# Patient Record
Sex: Male | Born: 1975 | Race: White | Hispanic: No | Smoking: Current every day smoker
Health system: Southern US, Community
[De-identification: ages and names within clinical notes are randomized; demographics above are authoritative.]

## PROBLEM LIST (undated history)

## (undated) DIAGNOSIS — J329 Chronic sinusitis, unspecified: Secondary | ICD-10-CM

## (undated) DIAGNOSIS — M549 Dorsalgia, unspecified: Secondary | ICD-10-CM

## (undated) DIAGNOSIS — G8929 Other chronic pain: Secondary | ICD-10-CM

---

## 2012-09-03 ENCOUNTER — Encounter (HOSPITAL_COMMUNITY): Payer: Self-pay | Admitting: Emergency Medicine

## 2012-09-03 ENCOUNTER — Emergency Department (INDEPENDENT_AMBULATORY_CARE_PROVIDER_SITE_OTHER)
Admission: EM | Admit: 2012-09-03 | Discharge: 2012-09-03 | Disposition: A | Discharge status: Home / Self Care | Payer: Medicaid Other | Source: Home / Self Care

## 2012-09-03 DIAGNOSIS — M549 Dorsalgia, unspecified: Secondary | ICD-10-CM

## 2012-09-03 DIAGNOSIS — G8929 Other chronic pain: Secondary | ICD-10-CM

## 2012-09-03 DIAGNOSIS — J309 Allergic rhinitis, unspecified: Secondary | ICD-10-CM

## 2012-09-03 HISTORY — DX: Dorsalgia, unspecified: M54.9

## 2012-09-03 MED ORDER — TRAMADOL HCL 50 MG PO TABS: 50.0000 mg | ORAL_TABLET | Freq: Four times a day (QID) | ORAL | Status: DC | PRN

## 2012-09-03 MED ORDER — METHYLPREDNISOLONE 4 MG PO KIT: PACK | ORAL | Status: DC

## 2012-09-03 MED ORDER — FLUTICASONE PROPIONATE 50 MCG/ACT NA SUSP: 2.0000 | Freq: Every day | NASAL | Status: DC

## 2012-09-03 NOTE — ED Notes (Signed)
Multiple complaints:  Low, middle back pain. Low back pain is chronic problem And reports sinus congestion.  Sinus issues started 2 days ago

## 2012-09-03 NOTE — ED Provider Notes (Signed)
Medical screening examination/treatment/procedure(s) were performed by non-physician practitioner and as supervising physician I was immediately available for consultation/collaboration.  Leslee Home, M.D.  Reuben Likes, MD 09/03/12 2107

## 2012-09-03 NOTE — ED Provider Notes (Signed)
History     CSN: [id number]  Arrival date & time 09/03/12  1305   First MD Initiated Contact with Patient 09/03/12 1453      Chief Complaint  Patient presents with  . Back Pain    (Consider location/radiation/quality/duration/timing/severity/associated sxs/prior treatment) HPI Comments: Patient complains of sinus infection for 2 days. 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 It is associated with PND minor intermittent sore throat. Denies fever, shortness of breath or chest pain. He has tried no medications for this. His second complaint is that of chronic back pain for which she states is due to a bulging disc. He was diagnosed with a several years ago and currently has a physician in [place]. He states when he came[place] he forgot his pain medications. He has been tried on tramadol, Toradol and other nonnarcotic agents which have not helped him. He is asking for something stronger. He states the back pain is located along the upper para lumbar musculature. Pain sometimes radiates down the back of his hip and thigh.   Past Medical History  Diagnosis Date  . Back pain     History reviewed. No pertinent past surgical history.  No family history on file.  History  Substance Use Topics  . Smoking status: Current Every Day Smoker  . Smokeless tobacco: Not on file  . Alcohol Use: No      Review of Systems  Constitutional: Negative.  Negative for diaphoresis and fatigue.  HENT: Positive for congestion, sore throat, rhinorrhea, trouble swallowing, postnasal drip and sinus pressure. Negative for hearing loss, ear pain, facial swelling, neck pain and neck stiffness.   Eyes: Negative for pain, discharge and redness.  Respiratory: Positive for cough. Negative for chest tightness and shortness of breath.   Cardiovascular: Negative.   Gastrointestinal: Negative.   Genitourinary: Negative.    Musculoskeletal: Positive for myalgias and back pain.  Skin: Negative.   Neurological: Negative.     Allergies  Review of patient's allergies indicates no known allergies.  Home Medications   Current Outpatient Rx  Name  Route  Sig  Dispense  Refill  . ValACYclovir HCl (VALTREX PO)   Oral   Take by mouth.         . fluticasone (FLONASE) 50 MCG/ACT nasal spray   Nasal   Place 2 sprays into the nose daily.   16 g   0   . Gabapentin (NEURONTIN PO)   Oral   Take by mouth.         . methylPREDNISolone (MEDROL DOSEPAK) 4 MG tablet      follow package directions   21 tablet   0   . Oxycodone-Acetaminophen (PERCOCET PO)   Oral   Take by mouth.         . traMADol (ULTRAM) 50 MG tablet   Oral   Take 1 tablet (50 mg total) by mouth every 6 (six) hours as needed for pain.   20 tablet   0     BP 132/70  Pulse 78  Temp(Src) 98.9 F (37.2 C) (Oral)  Resp 18  SpO2 99%  Physical Exam  Nursing note and vitals reviewed. Constitutional: He is oriented to person, place, and time. He appears well-developed and well-nourished. No distress.  HENT:  Right Ear: External ear normal.  Left Ear: External ear normal.  Minor minor oropharyngeal erythema with cobblestoning. No exudates or swelling. Bilateral TMs are normal  Neck: Normal range of motion. Neck supple.  Cardiovascular: Normal rate, regular rhythm and normal heart sounds.   Pulmonary/Chest: Effort normal and breath sounds normal. No respiratory distress.  Faint bilateral expiratory wheeze. He is a smoker.  Musculoskeletal: Normal range of motion. He exhibits tenderness. He exhibits no edema.  Tenderness along the lower parathoracic and upper paralumbar musculature. No step off deformity of the lumbar spine. No mobilization of the vertebrae. Moves all extremities. Strength 5 over 5.  Lymphadenopathy:    He has no cervical adenopathy.  Neurological: He is alert and oriented to person, place, and time.  Skin:  Skin is warm and dry. No rash noted.  Psychiatric: He has a normal mood and affect.    ED Course  Procedures (including critical care time)  Labs Reviewed - No data to display No results found.   1. Allergic sinusitis   2. Allergic rhinitis due to allergen   3. Chronic back pain greater than 3 months duration       MDM  Medrol Dosepak as directed, this is directed toward the treatment of his radiating back pain and sinus congestion, allergies and inflammation. Fluticasone nasal spray as directed tramadol 50 mg every 4-6 hours when necessary pain May augment Tylenol every 4 hours as needed. Copious amount of nasal saline spray throughout the day. Sudafed PE 10 mg every 4 hours when necessary congestion Claritin or Allegra OTC med daily as an antihistamine for allergies and drainage. Drink plenty of fluids stay well hydrated   Hayden Rasmussen, NP 09/03/12 1655

## 2012-09-17 ENCOUNTER — Encounter (HOSPITAL_COMMUNITY): Payer: Self-pay | Admitting: *Deleted

## 2012-09-17 ENCOUNTER — Emergency Department (HOSPITAL_COMMUNITY)
Admission: EM | Admit: 2012-09-17 | Discharge: 2012-09-17 | Disposition: A | Discharge status: Home / Self Care | Payer: Medicaid Other | Attending: Emergency Medicine | Admitting: Emergency Medicine

## 2012-09-17 DIAGNOSIS — K648 Other hemorrhoids: Secondary | ICD-10-CM | POA: Insufficient documentation

## 2012-09-17 DIAGNOSIS — M5416 Radiculopathy, lumbar region: Secondary | ICD-10-CM

## 2012-09-17 DIAGNOSIS — K921 Melena: Secondary | ICD-10-CM | POA: Insufficient documentation

## 2012-09-17 DIAGNOSIS — IMO0002 Reserved for concepts with insufficient information to code with codable children: Secondary | ICD-10-CM | POA: Insufficient documentation

## 2012-09-17 DIAGNOSIS — F17200 Nicotine dependence, unspecified, uncomplicated: Secondary | ICD-10-CM | POA: Insufficient documentation

## 2012-09-17 MED ORDER — CYCLOBENZAPRINE HCL 10 MG PO TABS
10.0000 mg | ORAL_TABLET | Freq: Once | ORAL | Status: AC
  Administered 2012-09-17: 10 mg via ORAL
  Filled 2012-09-17: qty 1

## 2012-09-17 MED ORDER — OXYCODONE-ACETAMINOPHEN 5-325 MG PO TABS
1.0000 | ORAL_TABLET | Freq: Once | ORAL | Status: AC
  Administered 2012-09-17: 1 via ORAL
  Filled 2012-09-17: qty 1

## 2012-09-17 MED ORDER — HYDROCORTISONE ACETATE 25 MG RE SUPP: 25.0000 mg | Freq: Two times a day (BID) | RECTAL | Status: DC

## 2012-09-17 MED ORDER — TRAMADOL HCL 50 MG PO TABS: 50.0000 mg | ORAL_TABLET | Freq: Four times a day (QID) | ORAL | Status: DC | PRN

## 2012-09-17 MED ORDER — CYCLOBENZAPRINE HCL 10 MG PO TABS: 10.0000 mg | ORAL_TABLET | Freq: Two times a day (BID) | ORAL | Status: DC | PRN

## 2012-09-17 MED ORDER — IBUPROFEN 600 MG PO TABS: 600.0000 mg | ORAL_TABLET | Freq: Four times a day (QID) | ORAL | Status: DC | PRN

## 2012-09-17 NOTE — ED Provider Notes (Signed)
History     CSN: [id number]  Arrival date & time 09/17/12  2059   First MD Initiated Contact with Patient 09/17/12 2157      Chief Complaint  Patient presents with  . Rectal Bleeding  . Back Pain    (Consider location/radiation/quality/duration/timing/severity/associated sxs/prior treatment) Patient is a 37 y.o. male presenting with back pain and hematochezia. The history is provided by the patient.  Back Pain Location:  Lumbar spine Quality:  Aching Radiates to:  R posterior upper leg Pain severity:  Moderate Pain is:  Same all the time Onset quality:  Sudden Duration:  3 weeks Timing:  Constant Progression:  Unchanged Chronicity:  Recurrent Ineffective treatments:  OTC medications Associated symptoms: no abdominal pain, no bladder incontinence, no bowel incontinence, no fever, no numbness, no paresthesias, no perianal numbness, no tingling and no weakness   Risk factors: no hx of cancer   Rectal Bleeding  The current episode started more than 2 weeks ago. The onset was sudden. The problem occurs occasionally. The problem has been unchanged. The patient is experiencing no pain. The stool is described as hard. There was no prior unsuccessful therapy. Pertinent negatives include no anorexia, no fever, no abdominal pain, no diarrhea, no nausea, no rectal pain and no vomiting.    Past Medical History  Diagnosis Date  . Back pain     History reviewed. No pertinent past surgical history.  No family history on file.  History  Substance Use Topics  . Smoking status: Current Every Day Smoker  . Smokeless tobacco: Not on file  . Alcohol Use: No      Review of Systems  Constitutional: Negative for fever.  Gastrointestinal: Positive for hematochezia. Negative for nausea, vomiting, abdominal pain, diarrhea, rectal pain, anorexia and bowel incontinence.  Genitourinary: Negative for bladder incontinence.  Musculoskeletal: Positive for back pain.  Neurological: Negative for  tingling, weakness, numbness and paresthesias.  All other systems reviewed and are negative.    Allergies  Review of patient's allergies indicates no known allergies.  Home Medications   Current Outpatient Rx  Name  Route  Sig  Dispense  Refill  . fluticasone (FLONASE) 50 MCG/ACT nasal spray   Nasal   Place 2 sprays into the nose daily.   16 g   0   . gabapentin (NEURONTIN) 300 MG capsule   Oral   Take 300 mg by mouth 3 (three) times daily.         . traMADol (ULTRAM) 50 MG tablet   Oral   Take 1 tablet (50 mg total) by mouth every 6 (six) hours as needed for pain.   20 tablet   0     BP 139/73  Pulse 80  Temp(Src) 97.8 F (36.6 C) (Oral)  Resp 16  SpO2 95%  Physical Exam  Vitals reviewed. Constitutional: He is oriented to person, place, and time. He appears well-developed and well-nourished. No distress.  HENT:  Head: Normocephalic.  Right Ear: External ear normal.  Left Ear: External ear normal.  Nose: Nose normal.  Mouth/Throat: Oropharynx is clear and moist. No oropharyngeal exudate.  Eyes: Conjunctivae and EOM are normal. Pupils are equal, round, and reactive to light.  Neck: Normal range of motion. Neck supple.  Cardiovascular: Normal rate, regular rhythm, normal heart sounds and intact distal pulses.  Exam reveals no gallop and no friction rub.   No murmur heard. Pulmonary/Chest: Effort normal and breath sounds normal.  Abdominal: Soft. Bowel sounds are normal. He exhibits no distension.  There is no tenderness.  Genitourinary: Rectal exam shows no external hemorrhoid, no fissure and no tenderness. Prostate is not enlarged and not tender.  Musculoskeletal: Normal range of motion. He exhibits no edema and no tenderness.       Lumbar back: He exhibits tenderness and spasm. He exhibits no bony tenderness.       Back:  5/5 strength in all LE muscle groups bilaterally Sensation intact No clonus Babinski downgoing  Neurological: He is alert and oriented  to person, place, and time. No cranial nerve deficit.  Skin: Skin is warm and dry.  Psychiatric: He has a normal mood and affect.    ED Course  Procedures (including critical care time)  Labs Reviewed - No data to display No results found.   1. Lumbar radiculopathy   2. Internal hemorrhoid       MDM   55 y F with PMH of recurrent back pain here with R sdied back pain radiating to his posterior R thigh and bleeding per rectum.  Back pain has occurred several times, with this most recent episode beginning approx 3 weeks ago after lifing furniture.  No alarm features.  No bowel/bladder incontinence.  Reassuring exam with full strength/sensation.  Percocet/flexeril here.  Pt looked up in [place] database and had no hits.  Rx's for Tramadol, Motrin and flexeril.  He reports that he has a f/u appt in one month with a PCP.  BRB per rectum occurs after BMs, intermittent, small volume, painless.  No ext hemorrhoids on exam.  Prostate WNLs.  No fissures.  Likely internal hemorrhoids given painless nature.  No family hx of colon CA or familial polyposis syndrome.  Rx for Anusol.  Improved bowel habits, increased H20 and fiber discussed.  Return precautions reviewed.  It is felt the pt is stable for d/c with close PCP f/u.  All questions answered and patient expressed understanding.  Disposition: Discharge  Condition: Good  Follow-up Information   Follow up with your regular doctor as discussed.      Pt seen in conjunction with my attending, Dr. Radford Pax.   Oleh Genin, MD PGY-II [facility] Emergency Medicine Resident         Oleh Genin, MD 09/17/12 [phone]

## 2012-09-17 NOTE — ED Notes (Signed)
Patient with back pain, lower back area, radiates down the back of his right leg.  Patient is able to walk with pain.  Patient also states he has been having some rectal bleeding, states that he sees bright red blood floating in toilet water.  Patient states he thinks he has ulcers.

## 2012-09-17 NOTE — ED Notes (Addendum)
Lower back and bleeding inside. When having a bm, "bloody stool." duration: back (chronic); rectal bleeding started x 3 weeks ago. Did go away and just started again x 2 days. Moderate bleeding with stool..turns water red. Pain radiating down rt. Leg from lower back.

## 2012-09-18 NOTE — ED Provider Notes (Signed)
I saw and evaluated the patient, reviewed the resident's note and I agree with the findings and plan.   .Face to face Exam:  General:  Awake HEENT:  Atraumatic Resp:  Normal effort Abd:  Nondistended Neuro:No focal weakness    Nelia Shi, MD 09/18/12 [phone]

## 2012-10-01 ENCOUNTER — Emergency Department (INDEPENDENT_AMBULATORY_CARE_PROVIDER_SITE_OTHER)
Admission: EM | Admit: 2012-10-01 | Discharge: 2012-10-01 | Disposition: A | Discharge status: Home / Self Care | Payer: Medicaid Other | Source: Home / Self Care

## 2012-10-01 ENCOUNTER — Encounter (HOSPITAL_COMMUNITY): Payer: Self-pay | Admitting: Emergency Medicine

## 2012-10-01 DIAGNOSIS — K219 Gastro-esophageal reflux disease without esophagitis: Secondary | ICD-10-CM

## 2012-10-01 DIAGNOSIS — IMO0002 Reserved for concepts with insufficient information to code with codable children: Secondary | ICD-10-CM

## 2012-10-01 DIAGNOSIS — G8929 Other chronic pain: Secondary | ICD-10-CM

## 2012-10-01 MED ORDER — TRAMADOL HCL (ER BIPHASIC) 200 MG PO TB24: 1.0000 | ORAL_TABLET | Freq: Every morning | ORAL | Status: DC

## 2012-10-01 MED ORDER — TRAMADOL HCL 50 MG PO TABS: 50.0000 mg | ORAL_TABLET | Freq: Four times a day (QID) | ORAL | Status: DC | PRN

## 2012-10-01 MED ORDER — ESOMEPRAZOLE MAGNESIUM 40 MG PO CPDR: 40.0000 mg | DELAYED_RELEASE_CAPSULE | Freq: Every day | ORAL | Status: DC

## 2012-10-01 NOTE — ED Notes (Signed)
C/o back pain, chronic pain issues.  Patient reports he has a "bulging disc and pinched nerve" reports stepping down off stairs and noticed pain.  This occurred on Monday.  Reports "jolting pain and burning sensation in right leg going from lower back down to ankle.

## 2012-10-01 NOTE — ED Notes (Signed)
Requested patient put on gown for physician exam

## 2012-10-01 NOTE — ED Provider Notes (Signed)
Medical screening examination/treatment/procedure(s) were performed by resident physician or non-physician practitioner and as supervising physician I was immediately available for consultation/collaboration.   Antonella Upson DOUGLAS MD.   Reiss Mowrey D Andera Cranmer, MD 10/01/12 2045 

## 2012-10-01 NOTE — ED Provider Notes (Signed)
Medical screening examination/treatment/procedure(s) were performed by resident physician or non-physician practitioner and as supervising physician I was immediately available for consultation/collaboration.   Kiarrah Rausch DOUGLAS MD.   Chad Tiznado D Dreshawn Hendershott, MD 10/01/12 1419 

## 2012-10-01 NOTE — ED Provider Notes (Addendum)
History     CSN: [id number]  Arrival date & time 10/01/12  1031   First MD Initiated Contact with Patient 10/01/12 1142      Chief Complaint  Patient presents with  . Back Pain    (Consider location/radiation/quality/duration/timing/severity/associated sxs/prior treatment) HPI Comments: 89 her old male with chronic intermittent back pain that he states is due to a pinched nerve. He was diagnosed several years ago and has undergone physical therapy and injections to the back. He was also given Percocet for pain however he did not like the way it made him feel he could not work with that so he does not take any more narcotics. His [place] narcotic database was a no hit. He is complaining of pain at all 3 and 4. Denies paralumbar musculature pain or tenderness. There is pain that radiates into the right thigh. He is currently taking Neurontin, ibuprofen and tramadol. He is out of tramadol and is asking for a new prescription for that until he sees his PCP which she just obtained later in the month. He denies loss of strength or sensation. The pain is worse with bending and lifting. Last Monday he stepped off a curb and that incident as reproduce the pain that he presents with today.  The second complaint is that of reflux. Complains of regurgitation reflux, heartburn that occurs intermittently over the past several months. He is taking Nexium in the past and is requesting a prescription.    Past Medical History  Diagnosis Date  . Back pain     History reviewed. No pertinent past surgical history.  No family history on file.  History  Substance Use Topics  . Smoking status: Current Every Day Smoker  . Smokeless tobacco: Not on file  . Alcohol Use: No      Review of Systems  Constitutional: Negative.   Respiratory: Negative.   Gastrointestinal:       As per history of present illness  Genitourinary: Negative.   Musculoskeletal: Positive for back pain. Negative for myalgias, joint  swelling and gait problem.       As per HPI  Skin: Negative.   Neurological: Negative for dizziness, weakness, numbness and headaches.    Allergies  Review of patient's allergies indicates no known allergies.  Home Medications   Current Outpatient Rx  Name  Route  Sig  Dispense  Refill  . cyclobenzaprine (FLEXERIL) 10 MG tablet   Oral   Take 1 tablet (10 mg total) by mouth 2 (two) times daily as needed for muscle spasms.   30 tablet   0   . fluticasone (FLONASE) 50 MCG/ACT nasal spray   Nasal   Place 2 sprays into the nose daily.   16 g   0   . gabapentin (NEURONTIN) 300 MG capsule   Oral   Take 300 mg by mouth 3 (three) times daily.         [name] esomeprazole (NEXIUM) 40 MG capsule   Oral   Take 1 capsule (40 mg total) by mouth daily.   30 capsule   0   . hydrocortisone (ANUSOL-HC) 25 MG suppository   Rectal   Place 1 suppository (25 mg total) rectally 2 (two) times daily. For 7 days   14 suppository   0   . ibuprofen (ADVIL,MOTRIN) 600 MG tablet   Oral   Take 1 tablet (600 mg total) by mouth every 6 (six) hours as needed for pain.   30 tablet   1   .  traMADol (ULTRAM) 50 MG tablet   Oral   Take 1 tablet (50 mg total) by mouth every 6 (six) hours as needed for pain.   20 tablet   0   . traMADol (ULTRAM) 50 MG tablet   Oral   Take 1 tablet (50 mg total) by mouth every 6 (six) hours as needed for pain.   60 tablet   0   . TraMADol HCl 200 MG TB24   Oral   Take 1 tablet by mouth every morning.   20 tablet   0     BP 130/81  Pulse 92  Temp(Src) 98.3 F (36.8 C) (Oral)  Resp 20  SpO2 98%  Physical Exam  Nursing note and vitals reviewed. Constitutional: He is oriented to person, place, and time. He appears well-developed and well-nourished. No distress.  HENT:  Head: Normocephalic and atraumatic.  Eyes: EOM are normal.  Neck: Normal range of motion. Neck supple.  Pulmonary/Chest: Effort normal.  Musculoskeletal: He exhibits tenderness. He  exhibits no edema.  Tenderness over L3 and 4. No apparent lumbar muscular tenderness. No tenderness in the right lower extremity.  Neurological: He is alert and oriented to person, place, and time. He has normal strength and normal reflexes. He displays no atrophy and no tremor. No cranial nerve deficit or sensory deficit. He exhibits normal muscle tone. Coordination and gait normal.  Skin: Skin is warm and dry.  Psychiatric: He has a normal mood and affect.    ED Course  Procedures (including critical care time)  Labs Reviewed - No data to display No results found.   1. Chronic radicular lumbar pain   2. GERD (gastroesophageal reflux disease)       MDM  Followup with Your primary care doctor later this month as scheduled. Nexium 40 mg daily when necessary reflux.Ultram 200mg  XR 1 q d prn pain.        Hayden Rasmussen, NP 10/01/12 1258  Hayden Rasmussen, NP 10/01/12 [phone]

## 2012-10-29 ENCOUNTER — Encounter (HOSPITAL_COMMUNITY): Payer: Self-pay | Admitting: Emergency Medicine

## 2012-10-29 DIAGNOSIS — J40 Bronchitis, not specified as acute or chronic: Secondary | ICD-10-CM | POA: Insufficient documentation

## 2012-10-29 DIAGNOSIS — R6883 Chills (without fever): Secondary | ICD-10-CM | POA: Insufficient documentation

## 2012-10-29 DIAGNOSIS — Z79899 Other long term (current) drug therapy: Secondary | ICD-10-CM | POA: Insufficient documentation

## 2012-10-29 DIAGNOSIS — G8929 Other chronic pain: Secondary | ICD-10-CM | POA: Insufficient documentation

## 2012-10-29 DIAGNOSIS — M545 Low back pain, unspecified: Secondary | ICD-10-CM | POA: Insufficient documentation

## 2012-10-29 DIAGNOSIS — R059 Cough, unspecified: Secondary | ICD-10-CM | POA: Insufficient documentation

## 2012-10-29 DIAGNOSIS — R61 Generalized hyperhidrosis: Secondary | ICD-10-CM | POA: Insufficient documentation

## 2012-10-29 DIAGNOSIS — Z8709 Personal history of other diseases of the respiratory system: Secondary | ICD-10-CM | POA: Insufficient documentation

## 2012-10-29 DIAGNOSIS — R05 Cough: Secondary | ICD-10-CM | POA: Insufficient documentation

## 2012-10-29 DIAGNOSIS — F17200 Nicotine dependence, unspecified, uncomplicated: Secondary | ICD-10-CM | POA: Insufficient documentation

## 2012-10-29 NOTE — ED Notes (Addendum)
PT. REPORTS CHRONIC LOW BACK PAIN FOR 2 DAYS AND PRODUCTIVE COUGH / CHEST CONGESTION FOR 3 DAYS , DENIES INJURY / AMBULATORY , NO DYSURIA OR HEMATURIA.

## 2012-10-30 ENCOUNTER — Emergency Department (HOSPITAL_COMMUNITY)
Admission: EM | Admit: 2012-10-30 | Discharge: 2012-10-30 | Disposition: A | Discharge status: Home / Self Care | Payer: Medicaid Other | Attending: Emergency Medicine | Admitting: Emergency Medicine

## 2012-10-30 DIAGNOSIS — M545 Low back pain: Secondary | ICD-10-CM

## 2012-10-30 DIAGNOSIS — G8929 Other chronic pain: Secondary | ICD-10-CM

## 2012-10-30 DIAGNOSIS — J40 Bronchitis, not specified as acute or chronic: Secondary | ICD-10-CM

## 2012-10-30 HISTORY — DX: Other chronic pain: G89.29

## 2012-10-30 HISTORY — DX: Dorsalgia, unspecified: M54.9

## 2012-10-30 HISTORY — DX: Chronic sinusitis, unspecified: J32.9

## 2012-10-30 MED ORDER — PREDNISONE 20 MG PO TABS
40.0000 mg | ORAL_TABLET | Freq: Once | ORAL | Status: AC
  Administered 2012-10-30: 40 mg via ORAL
  Filled 2012-10-30: qty 2

## 2012-10-30 MED ORDER — PREDNISONE 20 MG PO TABS: 40.0000 mg | ORAL_TABLET | Freq: Every day | ORAL | Status: DC

## 2012-10-30 MED ORDER — TRAMADOL HCL 50 MG PO TABS
50.0000 mg | ORAL_TABLET | Freq: Once | ORAL | Status: AC
  Administered 2012-10-30: 50 mg via ORAL
  Filled 2012-10-30: qty 1

## 2012-10-30 MED ORDER — ALBUTEROL SULFATE HFA 108 (90 BASE) MCG/ACT IN AERS
1.0000 | INHALATION_SPRAY | Freq: Four times a day (QID) | RESPIRATORY_TRACT | Status: DC | PRN
  Administered 2012-10-30: 2 via RESPIRATORY_TRACT
  Filled 2012-10-30: qty 6.7

## 2012-10-30 MED ORDER — TRAMADOL HCL 50 MG PO TABS: 50.0000 mg | ORAL_TABLET | Freq: Four times a day (QID) | ORAL | Status: DC | PRN

## 2012-10-30 NOTE — ED Notes (Signed)
Pt dc to home. Pt sts understanding to dc instructions. Pt ambulatory to exit without difficutly. Pt sts at dc that he wants an antibiotic and his oxycodone.  Informed him that md feels like this is what he needs.

## 2012-10-30 NOTE — Discharge Instructions (Signed)
Antibiotic Nonuse   Your caregiver felt that the infection or problem was not one that would be helped with an antibiotic.  Infections may be caused by viruses or bacteria. Only a caregiver can tell which one of these is the likely cause of an illness. A cold is the most common cause of infection in both adults and children. A cold is a virus. Antibiotic treatment will have no effect on a viral infection. Viruses can lead to many lost days of work caring for sick children and many missed days of school. Children may catch as many as 10 "colds" or "flus" per year during which they can be tearful, cranky, and uncomfortable. The goal of treating a virus is aimed at keeping the ill person comfortable.  Antibiotics are medications used to help the body fight bacterial infections. There are relatively few types of bacteria that cause infections but there are hundreds of viruses. While both viruses and bacteria cause infection they are very different types of germs. A viral infection will typically go away by itself within 7 to 10 days. Bacterial infections may spread or get worse without antibiotic treatment.  Examples of bacterial infections are:   Sore throats (like strep throat or tonsillitis).   Infection in the lung (pneumonia).   Ear and skin infections.  Examples of viral infections are:   Colds or flus.   Most coughs and bronchitis.   Sore throats not caused by Strep.   Runny noses.  It is often best not to take an antibiotic when a viral infection is the cause of the problem. Antibiotics can kill off the helpful bacteria that we have inside our body and allow harmful bacteria to start growing. Antibiotics can cause side effects such as allergies, nausea, and diarrhea without helping to improve the symptoms of the viral infection. Additionally, repeated uses of antibiotics can cause bacteria inside of our body to become resistant. That resistance can be passed onto harmful bacterial. The next time you have  an infection it may be harder to treat if antibiotics are used when they are not needed. Not treating with antibiotics allows our own immune system to develop and take care of infections more efficiently. Also, antibiotics will work better for us when they are prescribed for bacterial infections.  Treatments for a child that is ill may include:   Give extra fluids throughout the day to stay hydrated.   Get plenty of rest.   Only give your child over-the-counter or prescription medicines for pain, discomfort, or fever as directed by your caregiver.   The use of a cool mist humidifier may help stuffy noses.   Cold medications if suggested by your caregiver.  Your caregiver may decide to start you on an antibiotic if:   The problem you were seen for today continues for a longer length of time than expected.   You develop a secondary bacterial infection.  SEEK MEDICAL CARE IF:   Fever lasts longer than 5 days.   Symptoms continue to get worse after 5 to 7 days or become severe.   Difficulty in breathing develops.   Signs of dehydration develop (poor drinking, rare urinating, dark colored urine).   Changes in behavior or worsening tiredness (listlessness or lethargy).  Document Released: 07/14/2001 Document Revised: 07/28/2011 Document Reviewed: 01/10/2009  [employer] Patient Information 2014 [employer]

## 2012-10-30 NOTE — ED Provider Notes (Signed)
History     CSN: [id number]  Arrival date & time 10/29/12  2246   First MD Initiated Contact with Patient 10/30/12 0050      Chief Complaint  Patient presents with  . Back Pain  . Cough    (Consider location/radiation/quality/duration/timing/severity/associated sxs/prior treatment) HPI Comments: Pt reports he switched cigarettes brands recently and began coughing more heavily, feeling hot and occasional sweats.  He has had bronchitis in the past and he reprots he is always given a Z pak.  Pt also reports exacerbation of his chronic low back pain, reports he has a PCP appt in mid July, requests pain meds now until he can be seen.    Patient is a 37 y.o. male presenting with back pain and cough. The history is provided by the patient and medical records.  Back Pain Location:  Lumbar spine Quality:  Aching Radiates to:  Does not radiate Pain severity:  Severe Pain is:  Same all the time Duration: Present for years, worse in the past 2 days, attributes to coughing. Associated symptoms: no chest pain, no numbness and no weakness   Cough Cough characteristics:  Paroxysmal and non-productive Severity:  Moderate Onset quality:  Sudden Duration:  2 days Timing:  Constant Progression:  Unchanged Chronicity:  New Smoker: yes   Relieved by:  Nothing Worsened by:  Nothing tried Ineffective treatments:  None tried Associated symptoms: chills and diaphoresis   Associated symptoms: no chest pain, no rash, no rhinorrhea and no wheezing     Past Medical History  Diagnosis Date  . Back pain   . Chronic back pain   . Sinusitis     History reviewed. No pertinent past surgical history.  No family history on file.  History  Substance Use Topics  . Smoking status: Current Every Day Smoker  . Smokeless tobacco: Not on file  . Alcohol Use: No      Review of Systems  Constitutional: Positive for chills and diaphoresis. Negative for appetite change.  HENT: Negative for congestion  and rhinorrhea.   Respiratory: Positive for cough. Negative for chest tightness and wheezing.   Cardiovascular: Negative for chest pain.  Musculoskeletal: Positive for back pain.  Skin: Negative for color change and rash.  Neurological: Negative for weakness and numbness.    Allergies  Review of patient's allergies indicates no known allergies.  Home Medications   Current Outpatient Rx  Name  Route  Sig  Dispense  Refill  . esomeprazole (NEXIUM) 40 MG capsule   Oral   Take 1 capsule (40 mg total) by mouth daily.   30 capsule   0   . gabapentin (NEURONTIN) 300 MG capsule   Oral   Take 300 mg by mouth 3 (three) times daily.         . hydrOXYzine (ATARAX/VISTARIL) 50 MG tablet   Oral   Take 50 mg by mouth at bedtime.         . Multiple Vitamin (MULTIVITAMIN WITH MINERALS) TABS   Oral   Take 1 tablet by mouth daily.         [name] oxyCODONE-acetaminophen (PERCOCET) 10-325 MG per tablet   Oral   Take 1 tablet by mouth every 8 (eight) hours as needed for pain.         . traMADol (ULTRAM) 50 MG tablet   Oral   Take 1 tablet (50 mg total) by mouth every 6 (six) hours as needed for pain.   20 tablet   0   .  predniSONE (DELTASONE) 20 MG tablet   Oral   Take 2 tablets (40 mg total) by mouth daily.   12 tablet   0   . traMADol (ULTRAM) 50 MG tablet   Oral   Take 1 tablet (50 mg total) by mouth every 6 (six) hours as needed for pain.   20 tablet   0     BP 141/91  Pulse 103  Temp(Src) 98 F (36.7 C) (Oral)  Resp 18  SpO2 97%  Physical Exam  Nursing note and vitals reviewed. Constitutional: He appears well-developed and well-nourished.  Non-toxic appearance. He does not have a sickly appearance. He does not appear ill. No distress.  Smells of cigarette smoke  HENT:  Head: Normocephalic and atraumatic.  Eyes: EOM are normal.  Neck: Neck supple.  Cardiovascular: Normal rate and regular rhythm.   Pulmonary/Chest: Effort normal. No respiratory distress. He  has no wheezes. He has no rales.  Abdominal: Soft.  Lymphadenopathy:    He has no cervical adenopathy.  Skin: Skin is warm and dry. No rash noted. He is not diaphoretic.    ED Course  Procedures (including critical care time)  Labs Reviewed - No data to display No results found.   1. Bronchitis   2. Chronic low back pain     ra sat is 97% and I interpret to be normal  MDM  I reviewed prior records, pt has been to urgent care twice since April, once to the ED for low back pain.  Pt was given tramadol both times from the urgent care.  Reporting database reports only percocet filled in August 2013 once for 10 tablets only.  Pt with paroxysmal cough, no abn lung sounds, no fever, normal sats.  Will give prednsone and inhaler for bronchitis.  No need for abx.  Will give tramadol Rx.          Gavin Pound. Oletta Lamas, MD 10/30/12 9604

## 2012-11-01 ENCOUNTER — Encounter (HOSPITAL_COMMUNITY): Payer: Self-pay | Admitting: Nurse Practitioner

## 2012-11-01 ENCOUNTER — Emergency Department (HOSPITAL_COMMUNITY)
Admission: EM | Admit: 2012-11-01 | Discharge: 2012-11-01 | Disposition: A | Discharge status: Home / Self Care | Payer: Medicaid Other | Attending: Emergency Medicine | Admitting: Emergency Medicine

## 2012-11-01 DIAGNOSIS — J40 Bronchitis, not specified as acute or chronic: Secondary | ICD-10-CM

## 2012-11-01 DIAGNOSIS — IMO0001 Reserved for inherently not codable concepts without codable children: Secondary | ICD-10-CM | POA: Insufficient documentation

## 2012-11-01 DIAGNOSIS — Z79899 Other long term (current) drug therapy: Secondary | ICD-10-CM | POA: Insufficient documentation

## 2012-11-01 DIAGNOSIS — R05 Cough: Secondary | ICD-10-CM | POA: Insufficient documentation

## 2012-11-01 DIAGNOSIS — J329 Chronic sinusitis, unspecified: Secondary | ICD-10-CM | POA: Insufficient documentation

## 2012-11-01 DIAGNOSIS — G8929 Other chronic pain: Secondary | ICD-10-CM | POA: Insufficient documentation

## 2012-11-01 DIAGNOSIS — R6883 Chills (without fever): Secondary | ICD-10-CM | POA: Insufficient documentation

## 2012-11-01 DIAGNOSIS — M549 Dorsalgia, unspecified: Secondary | ICD-10-CM | POA: Insufficient documentation

## 2012-11-01 DIAGNOSIS — R059 Cough, unspecified: Secondary | ICD-10-CM | POA: Insufficient documentation

## 2012-11-01 DIAGNOSIS — F17200 Nicotine dependence, unspecified, uncomplicated: Secondary | ICD-10-CM | POA: Insufficient documentation

## 2012-11-01 MED ORDER — TRAMADOL HCL 50 MG PO TABS
50.0000 mg | ORAL_TABLET | Freq: Once | ORAL | Status: DC
  Filled 2012-11-01: qty 1

## 2012-11-01 NOTE — ED Provider Notes (Signed)
History    This chart was scribed for Linde Gillis, a non-physician practitioner working with No att. providers found by Lewanda Rife, ED Scribe. This patient was seen in room TR10C/TR10C and the patient's care was started at 1708.     CSN: [id number]  Arrival date & time 11/01/12  1605   First MD Initiated Contact with Patient 11/01/12 1625      Chief Complaint  Patient presents with  . Bronchitis    (Consider location/radiation/quality/duration/timing/severity/associated sxs/prior treatment) HPI HPI Comments: Cameron Hammond is a 37 y.o. male who presents to the Emergency Department complaining of improving waxing and waning moderate productive cough onset 9 days. Reports associated chills, and generalized myalgias. Denies associated fevers, chest pain, congestion, rhinorrhea, wheezing, and shortness of breath. Denies any aggravating or alleviating factors. Reports trying prescribed albuterol inhaler every 4 hours and prescribed prednisone with moderate relief of symptoms. Pt states his symptoms have been improving since he was evaluated in this ED a few days ago. Pt reports hx of chronic back pain.     Past Medical History  Diagnosis Date  . Back pain   . Chronic back pain   . Sinusitis     History reviewed. No pertinent past surgical history.  History reviewed. No pertinent family history.  History  Substance Use Topics  . Smoking status: Current Every Day Smoker  . Smokeless tobacco: Not on file  . Alcohol Use: No      Review of Systems  Constitutional: Positive for chills. Negative for fever.  HENT: Negative for congestion and rhinorrhea.   Respiratory: Positive for cough. Negative for wheezing.   Cardiovascular: Negative for chest pain.  Musculoskeletal: Positive for myalgias.  Skin: Negative for rash.  Psychiatric/Behavioral: Negative for confusion.    Allergies  Review of patient's allergies indicates no known allergies.  Home Medications    Current Outpatient Rx  Name  Route  Sig  Dispense  Refill  . albuterol (PROVENTIL HFA;VENTOLIN HFA) 108 (90 BASE) MCG/ACT inhaler   Inhalation   Inhale 2 puffs into the lungs every 6 (six) hours as needed for wheezing. For wheezing         . esomeprazole (NEXIUM) 40 MG capsule   Oral   Take 1 capsule (40 mg total) by mouth daily.   30 capsule   0   . gabapentin (NEURONTIN) 300 MG capsule   Oral   Take 300 mg by mouth 3 (three) times daily.         . hydrOXYzine (ATARAX/VISTARIL) 50 MG tablet   Oral   Take 50 mg by mouth at bedtime.         . Multiple Vitamin (MULTIVITAMIN WITH MINERALS) TABS   Oral   Take 1 tablet by mouth daily.         [name] oxyCODONE-acetaminophen (PERCOCET) 10-325 MG per tablet   Oral   Take 1 tablet by mouth every 8 (eight) hours as needed for pain.         . traMADol (ULTRAM) 50 MG tablet   Oral   Take 1 tablet (50 mg total) by mouth every 6 (six) hours as needed for pain.   20 tablet   0     BP 140/96  Pulse 98  Temp(Src) 98.6 F (37 C) (Oral)  Resp 16  Ht 5\' 7"  (1.702 m)  Wt 185 lb (83.915 kg)  BMI 28.97 kg/m2  SpO2 97%  Physical Exam  Nursing note and vitals reviewed. Constitutional: He is oriented  to person, place, and time. He appears well-developed and well-nourished. No distress.  HENT:  Head: Normocephalic and atraumatic.  Eyes: Conjunctivae and EOM are normal.  Neck: Neck supple. No tracheal deviation present.  Cardiovascular: Normal rate and regular rhythm.   Pulmonary/Chest: Effort normal and breath sounds normal. No respiratory distress. He has no wheezes. He has no rales. He exhibits no tenderness.  Musculoskeletal: Normal range of motion. He exhibits no tenderness.  Neurological: He is alert and oriented to person, place, and time.  Skin: Skin is warm and dry. He is not diaphoretic.  Psychiatric: He has a normal mood and affect. His behavior is normal.    ED Course  Procedures (including critical care  time) Medications - No data to display  Labs Reviewed - No data to display No results found.   1. Bronchitis       MDM  Lungs CTA. No indication for x-ray. Patients symptoms are consistent with bronchitis. Discussed that antibiotics are not indicated for viral infections. Pt will be discharged with symptomatic treatment.  Verbalizes understanding and is agreeable with plan. Pt is hemodynamically stable & in NAD prior to dc. Patient was angered by the fact he was not provided narcotic pain medication at this visit. Patient advised that narcotic pain medications were not warranted for bronchitis and he would need to follow up with his PCP regarding his chronic back pain. Patient was told this multiple times by myself and the nurse. Dr. Ranae Palms agreed with the plan to not provide any narcotic pain medications to the patient. Patient is stable at time of discharge.         I personally performed the services described in this documentation, which was scribed in my presence. The recorded information has been reviewed and is accurate.     Jeannetta Ellis, PA-C 11/01/12 2320

## 2012-11-01 NOTE — ED Notes (Addendum)
Pt refused medication multiple times, became very hostile, states he wanted percocet and antibiotics. PA informed and reinforced /clarified orders. Pt still hostile pt asked to leave and refused, wanted his medication, Security called and escorted out.

## 2012-11-01 NOTE — ED Notes (Signed)
Pt reports he was recently diagnosed with bronchitis here and started on steroids and inhaler, states symptoms are getting worse now, and requests a dose of antibiotics. States normally when he gets this, antibiotics have helped. A&ox4, resp e/u

## 2012-11-01 NOTE — ED Notes (Signed)
Pt was seen fri. For same, states no relief with medications. Has multiple complaints

## 2012-11-04 NOTE — ED Provider Notes (Signed)
Medical screening examination/treatment/procedure(s) were performed by non-physician practitioner and as supervising physician I was immediately available for consultation/collaboration.   Dennette Faulconer, MD 11/04/12 0711 

## 2012-11-19 ENCOUNTER — Emergency Department (HOSPITAL_COMMUNITY)
Admission: EM | Admit: 2012-11-19 | Discharge: 2012-11-19 | Disposition: A | Discharge status: Home / Self Care | Payer: Medicaid Other | Attending: Emergency Medicine | Admitting: Emergency Medicine

## 2012-11-19 ENCOUNTER — Emergency Department (HOSPITAL_COMMUNITY): Payer: Medicaid Other

## 2012-11-19 ENCOUNTER — Encounter (HOSPITAL_COMMUNITY): Payer: Self-pay | Admitting: *Deleted

## 2012-11-19 DIAGNOSIS — G8929 Other chronic pain: Secondary | ICD-10-CM | POA: Insufficient documentation

## 2012-11-19 DIAGNOSIS — R112 Nausea with vomiting, unspecified: Secondary | ICD-10-CM | POA: Insufficient documentation

## 2012-11-19 DIAGNOSIS — M542 Cervicalgia: Secondary | ICD-10-CM | POA: Insufficient documentation

## 2012-11-19 DIAGNOSIS — R109 Unspecified abdominal pain: Secondary | ICD-10-CM | POA: Insufficient documentation

## 2012-11-19 DIAGNOSIS — F17200 Nicotine dependence, unspecified, uncomplicated: Secondary | ICD-10-CM | POA: Insufficient documentation

## 2012-11-19 DIAGNOSIS — R059 Cough, unspecified: Secondary | ICD-10-CM | POA: Insufficient documentation

## 2012-11-19 DIAGNOSIS — B9789 Other viral agents as the cause of diseases classified elsewhere: Secondary | ICD-10-CM | POA: Insufficient documentation

## 2012-11-19 DIAGNOSIS — E860 Dehydration: Secondary | ICD-10-CM

## 2012-11-19 DIAGNOSIS — IMO0001 Reserved for inherently not codable concepts without codable children: Secondary | ICD-10-CM | POA: Insufficient documentation

## 2012-11-19 DIAGNOSIS — R51 Headache: Secondary | ICD-10-CM | POA: Insufficient documentation

## 2012-11-19 DIAGNOSIS — R05 Cough: Secondary | ICD-10-CM | POA: Insufficient documentation

## 2012-11-19 DIAGNOSIS — Z8709 Personal history of other diseases of the respiratory system: Secondary | ICD-10-CM | POA: Insufficient documentation

## 2012-11-19 DIAGNOSIS — M2550 Pain in unspecified joint: Secondary | ICD-10-CM | POA: Insufficient documentation

## 2012-11-19 DIAGNOSIS — R5381 Other malaise: Secondary | ICD-10-CM | POA: Insufficient documentation

## 2012-11-19 DIAGNOSIS — R5383 Other fatigue: Secondary | ICD-10-CM | POA: Insufficient documentation

## 2012-11-19 DIAGNOSIS — Z79899 Other long term (current) drug therapy: Secondary | ICD-10-CM | POA: Insufficient documentation

## 2012-11-19 DIAGNOSIS — B349 Viral infection, unspecified: Secondary | ICD-10-CM

## 2012-11-19 DIAGNOSIS — R509 Fever, unspecified: Secondary | ICD-10-CM

## 2012-11-19 DIAGNOSIS — R52 Pain, unspecified: Secondary | ICD-10-CM | POA: Insufficient documentation

## 2012-11-19 LAB — URINE MICROSCOPIC-ADD ON

## 2012-11-19 LAB — CBC WITH DIFFERENTIAL/PLATELET
Basophils Absolute: 0 10*3/uL (ref 0.0–0.1)
Basophils Relative: 0 % (ref 0–1)
Eosinophils Absolute: 0 10*3/uL (ref 0.0–0.7)
Eosinophils Relative: 0 % (ref 0–5)
HCT: 38.9 % — ABNORMAL LOW (ref 39.0–52.0)
Hemoglobin: 13.5 g/dL (ref 13.0–17.0)
MCH: 31 pg (ref 26.0–34.0)
MCHC: 34.7 g/dL (ref 30.0–36.0)
Monocytes Absolute: 1.4 10*3/uL — ABNORMAL HIGH (ref 0.1–1.0)
Monocytes Relative: 10 % (ref 3–12)
Neutro Abs: 11.5 10*3/uL — ABNORMAL HIGH (ref 1.7–7.7)
RDW: 12.7 % (ref 11.5–15.5)

## 2012-11-19 LAB — URINALYSIS, ROUTINE W REFLEX MICROSCOPIC
Glucose, UA: NEGATIVE mg/dL
Hgb urine dipstick: NEGATIVE
Leukocytes, UA: NEGATIVE
Protein, ur: 30 mg/dL — AB
Specific Gravity, Urine: 1.027 (ref 1.005–1.030)
pH: 6.5 (ref 5.0–8.0)

## 2012-11-19 LAB — COMPREHENSIVE METABOLIC PANEL
AST: 20 U/L (ref 0–37)
Albumin: 4.1 g/dL (ref 3.5–5.2)
BUN: 10 mg/dL (ref 6–23)
Calcium: 8.9 mg/dL (ref 8.4–10.5)
Creatinine, Ser: 1.01 mg/dL (ref 0.50–1.35)
Total Bilirubin: 0.5 mg/dL (ref 0.3–1.2)
Total Protein: 7.4 g/dL (ref 6.0–8.3)

## 2012-11-19 MED ORDER — DIPHENHYDRAMINE HCL 50 MG/ML IJ SOLN
25.0000 mg | Freq: Once | INTRAMUSCULAR | Status: AC
  Administered 2012-11-19: 25 mg via INTRAVENOUS
  Filled 2012-11-19: qty 1

## 2012-11-19 MED ORDER — METOCLOPRAMIDE HCL 5 MG/ML IJ SOLN
10.0000 mg | Freq: Once | INTRAMUSCULAR | Status: AC
  Administered 2012-11-19: 10 mg via INTRAVENOUS
  Filled 2012-11-19: qty 2

## 2012-11-19 MED ORDER — POTASSIUM CHLORIDE CRYS ER 20 MEQ PO TBCR
40.0000 meq | EXTENDED_RELEASE_TABLET | Freq: Once | ORAL | Status: AC
  Administered 2012-11-19: 40 meq via ORAL
  Filled 2012-11-19: qty 2

## 2012-11-19 MED ORDER — SODIUM CHLORIDE 0.9 % IV BOLUS (SEPSIS)
1000.0000 mL | Freq: Once | INTRAVENOUS | Status: AC
  Administered 2012-11-19: 1000 mL via INTRAVENOUS

## 2012-11-19 MED ORDER — KETOROLAC TROMETHAMINE 30 MG/ML IJ SOLN
30.0000 mg | Freq: Once | INTRAMUSCULAR | Status: AC
  Administered 2012-11-19: 30 mg via INTRAVENOUS
  Filled 2012-11-19: qty 1

## 2012-11-19 MED ORDER — ONDANSETRON HCL 4 MG/2ML IJ SOLN
4.0000 mg | Freq: Once | INTRAMUSCULAR | Status: AC
  Administered 2012-11-19: 4 mg via INTRAVENOUS
  Filled 2012-11-19: qty 2

## 2012-11-19 MED ORDER — IBUPROFEN 800 MG PO TABS
800.0000 mg | ORAL_TABLET | Freq: Once | ORAL | Status: AC
  Administered 2012-11-19: 800 mg via ORAL
  Filled 2012-11-19: qty 1

## 2012-11-19 MED ORDER — DEXAMETHASONE SODIUM PHOSPHATE 10 MG/ML IJ SOLN: 10.0000 mg | Freq: Once | INTRAMUSCULAR | Status: DC

## 2012-11-19 MED ORDER — MORPHINE SULFATE 4 MG/ML IJ SOLN
4.0000 mg | Freq: Once | INTRAMUSCULAR | Status: AC
  Administered 2012-11-19: 4 mg via INTRAVENOUS
  Filled 2012-11-19: qty 1

## 2012-11-19 NOTE — ED Provider Notes (Signed)
Medical screening examination/treatment/procedure(s) were conducted as a shared visit with non-physician practitioner(s) and myself.  I personally evaluated the patient during the encounter   Patient with no meningeal signs on exam. Does have viral symptoms consisting of sore throat mild cough. Has had a fever as well 2..The patient has meningitis. We'll give supportive care  Toy Baker, MD 11/21/12 2040

## 2012-11-19 NOTE — ED Provider Notes (Signed)
History    CSN: [id number] Arrival date & time 11/19/12  1911  First MD Initiated Contact with Patient 11/19/12 1926     Chief Complaint  Patient presents with  . Emesis  . Fever  . Abdominal Cramping  . Generalized Body Aches   (Consider location/radiation/quality/duration/timing/severity/associated sxs/prior Treatment) HPI Comments: 37 y/o male presents to the ED complaining of generalized body aches, nausea, vomiting and fever x 2 days. States his whole body feels sore, especially the sides of his neck into his shoulders, has a throbbing headache. Did not check temperature at home. Admits to associated productive cough with green phlegm. Unable to keep anything down and is feeling weak and tired. Denies abdominal pain, chest pain, photophobia, visual disturbance. No sick contacts or recent travel. He has not had any alleviating factors.   Patient is a 37 y.o. male presenting with vomiting, fever, and cramps. The history is provided by the patient.  Emesis Associated symptoms: arthralgias, chills, headaches and myalgias   Associated symptoms: no abdominal pain and no diarrhea   Fever Associated symptoms: chills, cough, headaches, myalgias, nausea and vomiting   Associated symptoms: no chest pain and no diarrhea   Abdominal Cramping Associated symptoms include arthralgias, chills, coughing, fatigue, a fever, headaches, myalgias, nausea, neck pain, vomiting and weakness. Pertinent negatives include no abdominal pain or chest pain.   Past Medical History  Diagnosis Date  . Back pain   . Chronic back pain   . Sinusitis    History reviewed. No pertinent past surgical history. No family history on file. History  Substance Use Topics  . Smoking status: Current Every Day Smoker  . Smokeless tobacco: Not on file  . Alcohol Use: No    Review of Systems  Constitutional: Positive for fever, chills and fatigue.  HENT: Positive for neck pain.   Eyes: Negative for photophobia and  visual disturbance.  Respiratory: Positive for cough.   Cardiovascular: Negative for chest pain.  Gastrointestinal: Positive for nausea and vomiting. Negative for abdominal pain, diarrhea and constipation.  Musculoskeletal: Positive for myalgias and arthralgias.  Neurological: Positive for weakness and headaches.  All other systems reviewed and are negative.    Allergies  Review of patient's allergies indicates no known allergies.  Home Medications   Current Outpatient Rx  Name  Route  Sig  Dispense  Refill  . albuterol (PROVENTIL HFA;VENTOLIN HFA) 108 (90 BASE) MCG/ACT inhaler   Inhalation   Inhale 2 puffs into the lungs every 6 (six) hours as needed for wheezing. For wheezing         . esomeprazole (NEXIUM) 40 MG capsule   Oral   Take 1 capsule (40 mg total) by mouth daily.   30 capsule   0   . hydrOXYzine (ATARAX/VISTARIL) 50 MG tablet   Oral   Take 50 mg by mouth at bedtime.         . Multiple Vitamin (MULTIVITAMIN WITH MINERALS) TABS   Oral   Take 1 tablet by mouth daily.          BP 154/83  Pulse 118  Temp(Src) 100.1 F (37.8 C) (Oral)  Resp 20  SpO2 99% Physical Exam  Nursing note and vitals reviewed. Constitutional: He is oriented to person, place, and time. He appears well-developed and well-nourished. No distress.  HENT:  Head: Normocephalic and atraumatic.  Mouth/Throat: Oropharynx is clear and moist.  Eyes: Conjunctivae and EOM are normal. Pupils are equal, round, and reactive to light. No scleral icterus.  Neck: Trachea normal and normal range of motion. Neck supple. No rigidity. No edema, no erythema and normal range of motion present. No Brudzinski's sign and no Kernig's sign noted.    Cardiovascular: Regular rhythm, normal heart sounds, intact distal pulses and normal pulses.  Tachycardia present.   Pulmonary/Chest: Effort normal and breath sounds normal. No respiratory distress. He has no wheezes. He has no rales.  Abdominal: Soft. Bowel  sounds are normal. He exhibits no distension. There is no tenderness.  Musculoskeletal: Normal range of motion. He exhibits no edema.  Neurological: He is alert and oriented to person, place, and time. He has normal strength. No cranial nerve deficit.  Skin: Skin is warm. No rash noted.  Skin clammy.  Psychiatric: He has a normal mood and affect. His behavior is normal.    ED Course  Procedures (including critical care time) Labs Reviewed  CBC WITH DIFFERENTIAL - Abnormal; Notable for the following:    WBC 14.2 (*)    HCT 38.9 (*)    Neutrophils Relative % 82 (*)    Neutro Abs 11.5 (*)    Lymphocytes Relative 9 (*)    Monocytes Absolute 1.4 (*)    All other components within normal limits  COMPREHENSIVE METABOLIC PANEL - Abnormal; Notable for the following:    Potassium 3.2 (*)    Glucose, Bld 121 (*)    All other components within normal limits  URINALYSIS, ROUTINE W REFLEX MICROSCOPIC - Abnormal; Notable for the following:    Color, Urine AMBER (*)    Bilirubin Urine SMALL (*)    Ketones, ur 15 (*)    Protein, ur 30 (*)    All other components within normal limits  URINE MICROSCOPIC-ADD ON - Abnormal; Notable for the following:    Bacteria, UA FEW (*)    Crystals CA OXALATE CRYSTALS (*)    All other components within normal limits   Dg Chest 2 View  11/19/2012   *RADIOLOGY REPORT*  Clinical Data: Emesis and fever.  CHEST - 2 VIEW  Comparison:  None.  Findings:  The heart size and mediastinal contours are within normal limits.  Both lungs are clear.  Irregularity along the inferior endplate of T11.  Findings could represent degenerative change but cannot exclude a mild compression deformity in this area.  IMPRESSION: No active cardiopulmonary disease.  Unusual configuration of the T11 vertebral body.  Recommend clinical correlation in this area.   Original Report Authenticated By: Richarda Overlie, M.D.   Ct Maxillofacial Wo Cm  11/19/2012   *RADIOLOGY REPORT*  Clinical Data:  Right  facial and head pain, had a cyst remove several years ago, fever for 1 day  CT MAXILLOFACIAL WITHOUT CONTRAST  Technique:  Multidetector CT imaging of the maxillofacial structures was performed. Multiplanar CT image reconstructions were also generated. Right side of face marked with a BB.  Comparison: None  Findings: Visualized intracranial contents unremarkable. Orbital soft tissue planes clear. Facial soft tissues unremarkable. Tiny mucosal retention cysts within the maxillary sinuses. Nasal septal deviation to the left. No mucosal thickening ethmoid air cells. Question prior right maxillary sinus decompression. No fracture or bone destruction.  IMPRESSION: No acute osseous abnormalities. Scattered mucosal thickening in the ethmoid air cells. Small mucosal retention cysts in the maxillary sinuses bilaterally with evidence of prior right maxillary sinus decompression.   Original Report Authenticated By: Ulyses Southward, M.D.   1. Viral syndrome   2. Fever   3. Dehydration     MDM  Pt with  fever, generalized body aches, n/v. Tenderness around neck, no meningeal signs. Neuro exam unremarkable. Tachycardic, clammy skin. Cbc, cmp, ua, cxr, fluid bolus, zofran. No recent travel or sick contacts. 9:21 PM Mild leukocytosis of 14.4. K 3.2, PO potassium given. Developing a worsening headache, will give IV benadryl, reglan, toradol. Patient also evaluated by Dr. Freida Busman. Doubt meningitis. Most likely viral syndrome. 9:41 PM Patient complaining of facial pain. Hx of sinusitis. Will get CT scan sinuses. Morphine for pain. 10:43 PM CT scan with results as shown above. He is feeling better after morphine and fluids. Requesting something to eat. He is stable for discharge. Conservative measures discussed. Return precautions discussed. Patient states understanding of plan and is agreeable.   Trevor Mace, PA-C 11/19/12 2245

## 2012-11-19 NOTE — ED Notes (Addendum)
C/o 2d generalized body aches cramping, nv, fever. Alert, NAD, calm, interactive, resps e/u, speaking in clear complete sentences, moaning, HR 120. No meds PTA.

## 2012-11-21 NOTE — ED Provider Notes (Signed)
Medical screening examination/treatment/procedure(s) were performed by non-physician practitioner and as supervising physician I was immediately available for consultation/collaboration.  Toy Baker, MD 11/21/12 [phone]

## 2020-06-02 ENCOUNTER — Emergency Department
Admission: EM | Admit: 2020-06-02 | Discharge: 2020-06-02 | Disposition: A | Discharge status: Home / Self Care | Payer: Self-pay | Attending: Emergency Medicine | Admitting: Emergency Medicine

## 2020-06-02 ENCOUNTER — Emergency Department: Payer: Self-pay

## 2020-06-02 ENCOUNTER — Encounter: Payer: Self-pay | Admitting: Emergency Medicine

## 2020-06-02 DIAGNOSIS — J40 Bronchitis, not specified as acute or chronic: Secondary | ICD-10-CM | POA: Insufficient documentation

## 2020-06-02 DIAGNOSIS — F17210 Nicotine dependence, cigarettes, uncomplicated: Secondary | ICD-10-CM | POA: Insufficient documentation

## 2020-06-02 DIAGNOSIS — R062 Wheezing: Secondary | ICD-10-CM

## 2020-06-02 MED ORDER — ACETAMINOPHEN 500 MG PO TABS
1000.0000 mg | ORAL_TABLET | Freq: Once | ORAL | Status: AC
  Administered 2020-06-02: 1000 mg via ORAL
  Filled 2020-06-02: qty 2

## 2020-06-02 MED ORDER — PREDNISONE 50 MG PO TABS: 50.0000 mg | ORAL_TABLET | Freq: Every day | ORAL | 0 refills | Status: AC

## 2020-06-02 MED ORDER — ALBUTEROL SULFATE HFA 108 (90 BASE) MCG/ACT IN AERS: 2.0000 | INHALATION_SPRAY | RESPIRATORY_TRACT | 2 refills | Status: DC | PRN

## 2020-06-02 MED ORDER — IBUPROFEN 600 MG PO TABS
600.0000 mg | ORAL_TABLET | Freq: Once | ORAL | Status: AC
  Administered 2020-06-02: 600 mg via ORAL
  Filled 2020-06-02: qty 1

## 2020-06-02 MED ORDER — PREDNISONE 20 MG PO TABS
60.0000 mg | ORAL_TABLET | Freq: Once | ORAL | Status: AC
  Administered 2020-06-02: 60 mg via ORAL
  Filled 2020-06-02: qty 3

## 2020-06-02 NOTE — Discharge Instructions (Signed)
You were seen in the ED because of your congestion and cough.  You have evidence of bronchitis, but no pneumonia or reasons for antibiotics to be given.  You are being discharged with 2 prescriptions: Prednisone steroids to take once daily for the next 4 days.  We gave you 1 tablet tonight, Saturday night, try your best to fill this prescription tomorrow and take once daily moving forward. Albuterol inhaler to reduce wheezing in your lungs and help your breathing.  1-2 puffs every 4-6 hours as needed.  Please take Tylenol and ibuprofen/Advil for your pain.  It is safe to take them together, or to alternate them every few hours.  Take up to 1000mg  of Tylenol at a time, up to 4 times per day.  Do not take more than 4000 mg of Tylenol in 24 hours.  For ibuprofen, take 400-600 mg, 4-5 times per day.  Call the attached number for [facility] clinic to be hooked up with a PCP.  Develop any worsening symptoms despite these medications, please return to the ED.

## 2020-06-02 NOTE — ED Provider Notes (Signed)
[facility] Emergency Department Provider Note ____________________________________________   Event Date/Time   First MD Initiated Contact with Patient 06/02/20 2146     (approximate)  I have reviewed the triage vital signs and the nursing notes.  HISTORY  Chief Complaint Nasal Congestion   HPI Cameron Hammond is a 45 y.o. malewho presents to the ED for evaluation of nasal congestion and cough.  Chart review indicates chronic pain syndrome. Patient reports being a lifelong cigarette smoker. No recent antibiotics or steroids.  Patient reports about 1 week of upper respiratory congestion primarily, reports associated increased cough with minimal increase in sputum production.  Denies chest pain, syncope, emesis, abdominal pain, fevers, syncope or trauma.  He reports upper respiratory congestion, improved with home antitussives and Mucinex, but always returning.  Reports associated clear rhinorrhea.     Past Medical History:  Diagnosis Date  . Back pain   . Chronic back pain   . Sinusitis     There are no problems to display for this patient.   History reviewed. No pertinent surgical history.  Prior to Admission medications   Medication Sig Start Date End Date Taking? Authorizing Provider  albuterol (VENTOLIN HFA) 108 (90 Base) MCG/ACT inhaler Inhale 2 puffs into the lungs every 4 (four) hours as needed for wheezing or shortness of breath. 06/02/20  Yes Delton Prairie, MD  predniSONE (DELTASONE) 50 MG tablet Take 1 tablet (50 mg total) by mouth daily for 4 days. 06/02/20 06/06/20 Yes Delton Prairie, MD  albuterol (PROVENTIL HFA;VENTOLIN HFA) 108 (90 BASE) MCG/ACT inhaler Inhale 2 puffs into the lungs every 6 (six) hours as needed for wheezing. For wheezing    [provider]  esomeprazole (NEXIUM) 40 MG capsule Take 1 capsule (40 mg total) by mouth daily. 10/01/12   Hayden Rasmussen, NP  hydrOXYzine (ATARAX/VISTARIL) 50 MG tablet Take 50 mg by mouth at  bedtime.    [provider]  Multiple Vitamin (MULTIVITAMIN WITH MINERALS) TABS Take 1 tablet by mouth daily.    [provider]    Allergies Patient has no known allergies.  History reviewed. No pertinent family history.  Social History Social History   Tobacco Use  . Smoking status: Current Every Day Smoker  . Smokeless tobacco: Never Used  Substance Use Topics  . Alcohol use: No  . Drug use: No    Review of Systems  Constitutional: No fever/chills Eyes: No visual changes. ENT: Positive for clear rhinorrhea and upper respiratory congestion Cardiovascular: Denies chest pain. Respiratory: Denies shortness of breath.  Positive for cough, minimally/nonproductive. Gastrointestinal: No abdominal pain.  No nausea, no vomiting.  No diarrhea.  No constipation. Genitourinary: Negative for dysuria. Musculoskeletal: Negative for back pain. Skin: Negative for rash. Neurological: Negative for headaches, focal weakness or numbness.  ____________________________________________   PHYSICAL EXAM:  VITAL SIGNS: Vitals:   06/02/20 1948  BP: (!) 172/85  Pulse: 84  Resp: 18  Temp: 98.6 F (37 C)  SpO2: 100%     Constitutional: Alert and oriented. Well appearing and in no acute distress. Eyes: Conjunctivae are normal. PERRL. EOMI. Head: Atraumatic. Nose: No congestion/rhinnorhea. Mouth/Throat: Mucous membranes are moist.  Oropharynx non-erythematous. Neck: No stridor. No cervical spine tenderness to palpation. Cardiovascular: Normal rate, regular rhythm. Grossly normal heart sounds.  Good peripheral circulation. Respiratory: Normal respiratory effort.  No retractions.  Conversational in full sentences. Diffuse expiratory wheezes with slight decrease in abdomen throughout, no focal features. Gastrointestinal: Soft , nondistended, nontender to palpation. No CVA tenderness.  Musculoskeletal: No lower extremity tenderness nor edema.  No joint effusions. No signs of  acute trauma. Neurologic:  Normal speech and language. No gross focal neurologic deficits are appreciated. No gait instability noted. Skin:  Skin is warm, dry and intact. No rash noted. Psychiatric: Mood and affect are normal. Speech and behavior are normal.  ____________________________________________   LABS (all labs ordered are listed, but only abnormal results are displayed)  Labs Reviewed - No data to display  ____________________________________________  RADIOLOGY  ED MD interpretation: 2 view CXR reviewed by me without evidence of acute cardiopulmonary pathology.  Official radiology report(s): DG Chest 2 View  Result Date: 06/02/2020 CLINICAL DATA:  Cough. EXAM: CHEST - 2 VIEW COMPARISON:  November 19, 2012. FINDINGS: The heart size and mediastinal contours are within normal limits. Both lungs are clear. The visualized skeletal structures are unremarkable. IMPRESSION: No active cardiopulmonary disease. Electronically Signed   By: Lupita Raider M.D.   On: 06/02/2020 20:23    ____________________________________________   PROCEDURES and INTERVENTIONS  Procedure(s) performed (including Critical Care):  Procedures  Medications  predniSONE (DELTASONE) tablet 60 mg (60 mg Oral Given 06/02/20 2221)  acetaminophen (TYLENOL) tablet 1,000 mg (1,000 mg Oral Given 06/02/20 2220)  ibuprofen (ADVIL) tablet 600 mg (600 mg Oral Given 06/02/20 2220)    ____________________________________________   MDM / ED COURSE   45 year old lifelong smoker presents to the ED with upper respiratory congestion and nonproductive cough, most consistent with bronchitis and amenable to outpatient management.  Normal vital signs on room air.  No evidence of hypoxia, distress, trauma or neurovascular deficits.  He looks well and is conversational.  Auscultation of his lung reveals expiratory wheezes, but no focal features.  CXR without infiltrates to suggest pneumonia or PTX.  I suspect bronchitis and I see  no indications for antibiotics.  We will start him on a course of prednisone and provide him with a prescription for albuterol.  We discussed return precautions for the ED and patient has been stable discharge home.      ____________________________________________   FINAL CLINICAL IMPRESSION(S) / ED DIAGNOSES  Final diagnoses:  Bronchitis  Wheezing  Cigarette smoker     ED Discharge Orders         Ordered    predniSONE (DELTASONE) 50 MG tablet  Daily        06/02/20 2229    albuterol (VENTOLIN HFA) 108 (90 Base) MCG/ACT inhaler  Every 4 hours PRN        06/02/20 2229           [place]   Note:  This document was prepared using Dragon voice recognition software and may include unintentional dictation errors.   Delton Prairie, MD 06/02/20 2253

## 2020-06-02 NOTE — ED Notes (Signed)
Patient not in treatment room when in to discharge patient.  Unable to locate patient in waiting room.

## 2020-06-02 NOTE — ED Triage Notes (Signed)
Pt c/o cough, sore throat and nasal congestion x1 1/2 weeks with no relief by OTC medication. Pt reports he get bronchitis once a year and feels he has it now and needs antibiotics. Pt smokes tobacco daily. Denies SOB.

## 2021-01-23 ENCOUNTER — Emergency Department
Admission: EM | Admit: 2021-01-23 | Discharge: 2021-01-23 | Disposition: A | Discharge status: Home / Self Care | Payer: Self-pay | Attending: Emergency Medicine | Admitting: Emergency Medicine

## 2021-01-23 ENCOUNTER — Encounter: Payer: Self-pay | Admitting: Emergency Medicine

## 2021-01-23 ENCOUNTER — Other Ambulatory Visit: Payer: Self-pay

## 2021-01-23 DIAGNOSIS — B349 Viral infection, unspecified: Secondary | ICD-10-CM | POA: Insufficient documentation

## 2021-01-23 DIAGNOSIS — F17200 Nicotine dependence, unspecified, uncomplicated: Secondary | ICD-10-CM | POA: Insufficient documentation

## 2021-01-23 DIAGNOSIS — R509 Fever, unspecified: Secondary | ICD-10-CM

## 2021-01-23 DIAGNOSIS — Z20822 Contact with and (suspected) exposure to covid-19: Secondary | ICD-10-CM

## 2021-01-23 DIAGNOSIS — R52 Pain, unspecified: Secondary | ICD-10-CM

## 2021-01-23 DIAGNOSIS — U071 COVID-19: Secondary | ICD-10-CM | POA: Insufficient documentation

## 2021-01-23 MED ORDER — IBUPROFEN 600 MG PO TABS
600.0000 mg | ORAL_TABLET | Freq: Once | ORAL | Status: AC
  Administered 2021-01-23: 600 mg via ORAL
  Filled 2021-01-23: qty 1

## 2021-01-23 MED ORDER — ACETAMINOPHEN 500 MG PO TABS
ORAL_TABLET | ORAL | Status: AC
  Administered 2021-01-23: 1000 mg via ORAL
  Filled 2021-01-23: qty 2

## 2021-01-23 MED ORDER — ONDANSETRON 4 MG PO TBDP: 4.0000 mg | ORAL_TABLET | Freq: Three times a day (TID) | ORAL | 0 refills | Status: DC | PRN

## 2021-01-23 MED ORDER — ACETAMINOPHEN 500 MG PO TABS: 1000.0000 mg | ORAL_TABLET | Freq: Once | ORAL | Status: AC

## 2021-01-23 MED ORDER — ONDANSETRON 8 MG PO TBDP
8.0000 mg | ORAL_TABLET | Freq: Once | ORAL | Status: AC
  Administered 2021-01-23: 8 mg via ORAL
  Filled 2021-01-23: qty 1

## 2021-01-23 MED ORDER — IBUPROFEN 800 MG PO TABS: 800.0000 mg | ORAL_TABLET | Freq: Three times a day (TID) | ORAL | 0 refills | Status: DC | PRN

## 2021-01-23 NOTE — ED Triage Notes (Signed)
C/O chills and cough today.  Exposed to COVID by a coworker.  VS wnl.

## 2021-01-23 NOTE — ED Notes (Signed)
See triage note   Presents with fever and body aches  States sx's started today

## 2021-01-23 NOTE — ED Provider Notes (Signed)
[facility] Emergency Department Provider Note   ____________________________________________   Event Date/Time   First MD Initiated Contact with Patient 01/23/21 1808     (approximate)  I have reviewed the triage vital signs and the nursing notes.   HISTORY  Chief Complaint URI    HPI Cameron Hammond is a 45 y.o. male who presents for fever, generalized body aches, and decreased appetite over the last 24 hours after a close exposure to a colleague with COVID-19.  Patient states that the symptoms have been worsening since onset and have no exacerbating or relieving factors.  Patient has not tried any antipyretics or antinausea medicine at home to try to relieve the symptoms.  Patient describes him as severe.           Past Medical History:  Diagnosis Date   Back pain    Chronic back pain    Sinusitis     There are no problems to display for this patient.   History reviewed. No pertinent surgical history.  Prior to Admission medications   Medication Sig Start Date End Date Taking? Authorizing Provider  ibuprofen (ADVIL) 800 MG tablet Take 1 tablet (800 mg total) by mouth every 8 (eight) hours as needed. 01/23/21  Yes Merwyn Katos, MD  ondansetron (ZOFRAN ODT) 4 MG disintegrating tablet Take 1 tablet (4 mg total) by mouth every 8 (eight) hours as needed for nausea or vomiting. 01/23/21  Yes Merwyn Katos, MD  albuterol (PROVENTIL HFA;VENTOLIN HFA) 108 (90 BASE) MCG/ACT inhaler Inhale 2 puffs into the lungs every 6 (six) hours as needed for wheezing. For wheezing    [provider]  albuterol (VENTOLIN HFA) 108 (90 Base) MCG/ACT inhaler Inhale 2 puffs into the lungs every 4 (four) hours as needed for wheezing or shortness of breath. 06/02/20   Delton Prairie, MD  esomeprazole (NEXIUM) 40 MG capsule Take 1 capsule (40 mg total) by mouth daily. 10/01/12   Hayden Rasmussen, NP  hydrOXYzine (ATARAX/VISTARIL) 50 MG tablet Take 50 mg by mouth at bedtime.     [provider]  Multiple Vitamin (MULTIVITAMIN WITH MINERALS) TABS Take 1 tablet by mouth daily.    [provider]    Allergies Patient has no known allergies.  No family history on file.  Social History Social History   Tobacco Use   Smoking status: Every Day   Smokeless tobacco: Never  Substance Use Topics   Alcohol use: No   Drug use: No    Review of Systems Constitutional: Endorses fever/chills Eyes: No visual changes. ENT: No sore throat. Cardiovascular: Denies chest pain. Respiratory: Denies shortness of breath. Gastrointestinal: No abdominal pain.  No nausea, no vomiting.  No diarrhea. Genitourinary: Negative for dysuria. Musculoskeletal: Negative for acute arthralgias Skin: Negative for rash. Neurological: Positive for headaches and generalized weakness.  Denies numbness/paresthesias in any extremity Psychiatric: Negative for suicidal ideation/homicidal ideation   ____________________________________________   PHYSICAL EXAM:  VITAL SIGNS: ED Triage Vitals  Enc Vitals Group     BP 01/23/21 1801 129/80     Pulse Rate 01/23/21 1801 (!) 116     Resp 01/23/21 1801 20     Temp 01/23/21 1801 (!) 102.9 F (39.4 C)     Temp Source 01/23/21 1801 Oral     SpO2 01/23/21 1801 97 %     Weight 01/23/21 1732 184 lb 15.5 oz (83.9 kg)     Height 01/23/21 1732 5\' 7"  (1.702 m)     Head  Circumference --      Peak Flow --      Pain Score 01/23/21 1732 0     Pain Loc --      Pain Edu? --      Excl. in GC? --    Constitutional: Alert and oriented. Well appearing and in no acute distress. Eyes: Conjunctivae are normal. PERRL. Head: Atraumatic. Nose: No congestion/rhinnorhea. Mouth/Throat: Mucous membranes are moist. Neck: No stridor Cardiovascular: Grossly normal heart sounds.  Good peripheral circulation. Respiratory: Normal respiratory effort.  No retractions. Gastrointestinal: Soft and nontender. No distention. Musculoskeletal: No obvious  deformities Neurologic:  Normal speech and language. No gross focal neurologic deficits are appreciated. Skin:  Skin is warm and dry. No rash noted. Psychiatric: Mood and affect are normal. Speech and behavior are normal.  ____________________________________________   LABS (all labs ordered are listed, but only abnormal results are displayed)  Labs Reviewed  SARS CORONAVIRUS 2 (TAT 6-24 HRS)   PROCEDURES  Procedure(s) performed (including Critical Care):  .1-3 Lead EKG Interpretation  Date/Time: 01/23/2021 7:56 PM Performed by: Merwyn Katos, MD Authorized by: Merwyn Katos, MD     Interpretation: abnormal     ECG rate:  115   ECG rate assessment: tachycardic     Rhythm: sinus tachycardia     Ectopy: none     Conduction: normal     ____________________________________________   INITIAL IMPRESSION / ASSESSMENT AND PLAN / ED COURSE  As part of my medical decision making, I reviewed the following data within the electronic medical record, if available:  Nursing notes reviewed and incorporated, Labs reviewed, EKG interpreted, Old chart reviewed, Radiograph reviewed and Notes from prior ED visits reviewed and incorporated        Otherwise healthy patient presenting with constellation of symptoms likely representing uncomplicated viral syndrome as characterized by fever/chills/body aches  Unlikely PTA/RPA: no hot potato voice, no uvular deviation, Unlikely Esophageal rupture: No history of dysphagia Unlikely deep space infection/Ludwigs Low suspicion for CNS infection bacterial sinusitis, or pneumonia given exam and history.  Unlikely Strep or EBV as centor negative and with no pharyngeal exudate, posterior LAD, or splenomegaly.  Will attempt to alleviate symptoms conservatively; no overt indications at this time for antibiotics. No respiratory distress, otherwise relatively well appearing and nontoxic. Will discuss prompt follow up with PMD and strict return  precautions.      ____________________________________________   FINAL CLINICAL IMPRESSION(S) / ED DIAGNOSES  Final diagnoses:  Viral syndrome  Fever, unspecified fever cause  Body aches  Close exposure to COVID-19 virus     ED Discharge Orders          Ordered    ondansetron (ZOFRAN ODT) 4 MG disintegrating tablet  Every 8 hours PRN        01/23/21 1947    ibuprofen (ADVIL) 800 MG tablet  Every 8 hours PRN        01/23/21 1947             Note:  This document was prepared using Dragon voice recognition software and may include unintentional dictation errors.    Merwyn Katos, MD 01/23/21 [phone]

## 2021-01-24 LAB — SARS CORONAVIRUS 2 (TAT 6-24 HRS): SARS Coronavirus 2: POSITIVE — AB

## 2021-03-31 ENCOUNTER — Emergency Department: Payer: Self-pay

## 2021-03-31 ENCOUNTER — Other Ambulatory Visit: Payer: Self-pay

## 2021-03-31 ENCOUNTER — Emergency Department
Admission: EM | Admit: 2021-03-31 | Discharge: 2021-03-31 | Disposition: A | Discharge status: Home / Self Care | Payer: Self-pay | Attending: Emergency Medicine | Admitting: Emergency Medicine

## 2021-03-31 DIAGNOSIS — S6991XA Unspecified injury of right wrist, hand and finger(s), initial encounter: Secondary | ICD-10-CM | POA: Insufficient documentation

## 2021-03-31 DIAGNOSIS — F17210 Nicotine dependence, cigarettes, uncomplicated: Secondary | ICD-10-CM | POA: Insufficient documentation

## 2021-03-31 DIAGNOSIS — W228XXA Striking against or struck by other objects, initial encounter: Secondary | ICD-10-CM | POA: Insufficient documentation

## 2021-03-31 MED ORDER — OXYCODONE-ACETAMINOPHEN 5-325 MG PO TABS: 1.0000 | ORAL_TABLET | ORAL | 0 refills | Status: AC | PRN

## 2021-03-31 MED ORDER — OXYCODONE HCL 5 MG PO TABS
10.0000 mg | ORAL_TABLET | Freq: Once | ORAL | Status: AC
  Administered 2021-03-31: 10 mg via ORAL
  Filled 2021-03-31: qty 2

## 2021-03-31 NOTE — ED Provider Notes (Signed)
[facility] Emergency Department Provider Note    ____________________________________________   None    (approximate)  I have reviewed the triage vital signs and the nursing notes.   HISTORY  Chief Complaint Hand Injury   HPI Cameron Hammond is a 45 y.o. male, history of chronic back pain and osteoarthritis, presents to the emergency department for evaluation of right hand pain.  Patient states he was playing with his son using boxing gloves to punch holes into the wall he was tearing down and accidentally hit a stud in the wall with his right hand.  He states that he did not feel any significant pain at the time with no notable pops or cracks, but slowly began to notice remarkable swelling and pain over the course of the past 12 hours.  Patient states that he was unable to sleep last night due to the pain.  Patient states that the pain radiates from his knuckles down through his wrist.  He has tried maximum doses of both Tylenol and ibuprofen with minimal improvement.  Denies fever/chills, shoulder pain, elbow pain, or chest pain.   History limited by: None  Past Medical History:  Diagnosis Date   Back pain    Chronic back pain    Sinusitis     There are no problems to display for this patient.   History reviewed. No pertinent surgical history.  Prior to Admission medications   Medication Sig Start Date End Date Taking? Authorizing Provider  oxyCODONE-acetaminophen (PERCOCET) 5-325 MG tablet Take 1 tablet by mouth every 4 (four) hours as needed for up to 5 days for severe pain. 03/31/21 04/05/21 Yes Teodoro Spray, PA  albuterol (PROVENTIL HFA;VENTOLIN HFA) 108 (90 BASE) MCG/ACT inhaler Inhale 2 puffs into the lungs every 6 (six) hours as needed for wheezing. For wheezing    [provider]  albuterol (VENTOLIN HFA) 108 (90 Base) MCG/ACT inhaler Inhale 2 puffs into the lungs every 4 (four) hours as needed for wheezing or shortness of breath.  06/02/20   Vladimir Crofts, MD  esomeprazole (NEXIUM) 40 MG capsule Take 1 capsule (40 mg total) by mouth daily. 10/01/12   Janne Napoleon, NP  hydrOXYzine (ATARAX/VISTARIL) 50 MG tablet Take 50 mg by mouth at bedtime.    [provider]  ibuprofen (ADVIL) 800 MG tablet Take 1 tablet (800 mg total) by mouth every 8 (eight) hours as needed. 01/23/21   Naaman Plummer, MD  Multiple Vitamin (MULTIVITAMIN WITH MINERALS) TABS Take 1 tablet by mouth daily.    [provider]  ondansetron (ZOFRAN ODT) 4 MG disintegrating tablet Take 1 tablet (4 mg total) by mouth every 8 (eight) hours as needed for nausea or vomiting. 01/23/21   Naaman Plummer, MD    Allergies Patient has no known allergies.  History reviewed. No pertinent family history.  Social History Social History   Tobacco Use   Smoking status: Every Day   Smokeless tobacco: Never  Substance Use Topics   Alcohol use: No   Drug use: No    Review of Systems  Constitutional: Negative for fever/chills, weight loss, or fatigue.  Eyes: Negative for visual changes or discharge.  ENT: Negative for congestion, hearing changes, or sore throat.  Gastrointestinal: Negative for abdominal pain, nausea/vomiting, or diarrhea.  Genitourinary: Negative for dysuria or hematuria.  Musculoskeletal: Positive for right hand pain Negative for back pain or joint pain.  Skin: Negative for rashes or lesions.  Neurological: Negative for headache, syncope, dizziness, tremors,  or numbness/tingling.   10-point ROS otherwise negative. ____________________________________________   PHYSICAL EXAM:  VITAL SIGNS: ED Triage Vitals  Enc Vitals Group     BP 03/31/21 1957 140/88     Pulse Rate 03/31/21 1957 94     Resp 03/31/21 1957 18     Temp 03/31/21 1957 98.9 F (37.2 C)     Temp Source 03/31/21 1957 Oral     SpO2 03/31/21 1957 98 %     Weight 03/31/21 1957 185 lb (83.9 kg)     Height 03/31/21 1957 5\' 7"  (1.702 m)     Head Circumference --       Peak Flow --      Pain Score 03/31/21 2007 9     Pain Loc --      Pain Edu? --      Excl. in GC? --     Physical Exam Constitutional:      Appearance: Normal appearance.  HENT:     Head: Normocephalic and atraumatic.     Nose: Nose normal.  Eyes:     Extraocular Movements: Extraocular movements intact.     Conjunctiva/sclera: Conjunctivae normal.     Pupils: Pupils are equal, round, and reactive to light.  Cardiovascular:     Rate and Rhythm: Normal rate.  Pulmonary:     Effort: Pulmonary effort is normal.  Abdominal:     General: Abdomen is flat.     Palpations: Abdomen is soft.  Musculoskeletal:     Cervical back: Normal range of motion.     Comments: Significant soft tissue swelling along the dorsum and thenar eminence of the right hand.  No gross deformities.  Pulses intact.  Patient has limited strength while squeezing, but otherwise full range of motion of the hand and wrist.  Strength of flexion and extension of digits is normal.  No penetrations within the skin.  Sensation intact.  No snuffbox tenderness noted..  Skin:    General: Skin is warm and dry.  Neurological:     General: No focal deficit present.     Mental Status: He is alert and oriented to person, place, and time. Mental status is at baseline.  Psychiatric:        Mood and Affect: Mood normal.        Behavior: Behavior normal.        Thought Content: Thought content normal.        Judgment: Judgment normal.     ____________________________________________    LABS  (all labs ordered are listed, but only abnormal results are displayed)  Labs Reviewed - No data to display   ____________________________________________   EKG None.   ____________________________________________    RADIOLOGY I personally viewed and evaluated these images as part of my medical decision making, as well as reviewing the written report by the radiologist.  ED Provider Interpretation: I agree with the  interpretation of the radiologist.  Soft tissue swelling noted.  No evidence of fracture or dislocation in the hand or wrist  DG Wrist Complete Right  Result Date: 03/31/2021 CLINICAL DATA:  right hand pain and swelling EXAM: RIGHT WRIST - COMPLETE 3+ VIEW COMPARISON:  None. FINDINGS: There is no evidence of fracture or dislocation. There is no evidence of arthropathy or other focal bone abnormality. Soft tissues are unremarkable. IMPRESSION: Negative right wrist radiographs. Electronically Signed   By: Caprice Renshaw M.D.   On: 03/31/2021 20:37   DG Hand Complete Right  Result Date: 03/31/2021 CLINICAL DATA:  Right hand pain and swelling EXAM: RIGHT HAND - COMPLETE 3+ VIEW COMPARISON:  None. FINDINGS: There is no evidence of fracture or dislocation. There is no evidence of arthropathy or other focal bone abnormality. There is moderate soft tissue swelling involving the thenar eminence and dorsum of the right hand. IMPRESSION: Soft tissue swelling.  No fracture or dislocation. Electronically Signed   By: Helyn Numbers M.D.   On: 03/31/2021 20:32    ____________________________________________   PROCEDURES  Procedures   Medications  oxyCODONE (Oxy IR/ROXICODONE) immediate release tablet 10 mg (10 mg Oral Given 03/31/21 2225)    Critical Care performed: No  ____________________________________________   INITIAL IMPRESSION / ASSESSMENT AND PLAN / ED COURSE  Pertinent labs & imaging results that were available during my care of the patient were reviewed by me and considered in my medical decision making (see chart for details).       Cameron Hammond is a 45 y.o. male, history of chronic back pain and osteoarthritis, presents to the emergency department for evaluation of right hand injury after punching a stud in the wall.  On exam, patient is visibly uncomfortable and in pain.  There is notable soft tissue swelling in the right hand.  No gross deformities noted.  Patient denies snuffbox  tenderness.  Examination of the forearm and elbow is unremarkable.  Pulses and sensation is intact.   X-ray of the right hand and wrist are reassuring.  No fractures or dislocations noted.  Given no significant wrist pain or endorsement of snuffbox tenderness, no scaphoid fracture suspected.  I discussed with the patient the findings of the x-ray, which he found to be reassuring.  We discussed the need for orthopedic follow-up to evaluate further for soft tissue injuries. Will treat pain initially with oxycodone and plan to discharge patient with a wrist brace and prescription for short term supply of oxycodone-acetaminophen.  Will provide contact information for orthopedic follow-up.  Patient given strict return precautions and anticipatory guidance.    ____________________________________________   FINAL CLINICAL IMPRESSION(S) / ED DIAGNOSES  Final diagnoses:  Injury of right hand, initial encounter     NEW MEDICATIONS STARTED DURING THIS VISIT:  ED Discharge Orders          Ordered    oxyCODONE-acetaminophen (PERCOCET) 5-325 MG tablet  Every 4 hours PRN        03/31/21 2225             Note:  This document was prepared using Dragon voice recognition software and may include unintentional dictation errors.    Varney Daily, [place] 03/31/21 2239    Sharman Cheek, MD 03/31/21 2240

## 2021-03-31 NOTE — ED Triage Notes (Signed)
Pt presents to ER from home.  Per pt, he was playing with his son using boxing gloves to punch holes through a wall he was tearing down and accidentally hit a stud in the wall with his right hand. Swelling and pain noted to right hand at this time.

## 2021-03-31 NOTE — Discharge Instructions (Addendum)
You were evaluated today for an injury to your right hand.  Your x-ray was reassuring, showing no signs of fractures or dislocations.  However there is significant soft tissue injury that may be very painful for you over the next few days.  I encourage you to take your prescribed medication, as needed.  With supplementation of ibuprofen to reduce swelling.  I encourage you to rest, ice, and avoid further injury to your hand.  You may continue to wear your brace as needed.  Please contact the orthopedic clinic for a follow-up appointment.[name]

## 2021-03-31 NOTE — ED Notes (Signed)
Dc ppw provided. RX information given. Followup information given. Pt denies questions and denies vs at dc. Pt assised off unit on foot. "Friend picking me up outside."

## 2021-10-03 ENCOUNTER — Emergency Department: Admission: EM | Admit: 2021-10-03 | Discharge: 2021-10-03 | Discharge status: Against Medical Advice | Payer: Self-pay

## 2021-10-10 ENCOUNTER — Emergency Department: Payer: Self-pay

## 2021-10-10 ENCOUNTER — Inpatient Hospital Stay
Admission: EM | Admit: 2021-10-10 | Discharge: 2021-11-18 | DRG: 871 | Disposition: A | Discharge status: Home / Self Care | Payer: Self-pay | Attending: Internal Medicine | Admitting: Internal Medicine

## 2021-10-10 ENCOUNTER — Other Ambulatory Visit: Payer: Self-pay

## 2021-10-10 DIAGNOSIS — G061 Intraspinal abscess and granuloma: Secondary | ICD-10-CM | POA: Diagnosis present

## 2021-10-10 DIAGNOSIS — F17210 Nicotine dependence, cigarettes, uncomplicated: Secondary | ICD-10-CM | POA: Diagnosis present

## 2021-10-10 DIAGNOSIS — Z79899 Other long term (current) drug therapy: Secondary | ICD-10-CM

## 2021-10-10 DIAGNOSIS — F1110 Opioid abuse, uncomplicated: Secondary | ICD-10-CM | POA: Diagnosis present

## 2021-10-10 DIAGNOSIS — M4623 Osteomyelitis of vertebra, cervicothoracic region: Secondary | ICD-10-CM | POA: Diagnosis present

## 2021-10-10 DIAGNOSIS — R937 Abnormal findings on diagnostic imaging of other parts of musculoskeletal system: Principal | ICD-10-CM

## 2021-10-10 DIAGNOSIS — F1911 Other psychoactive substance abuse, in remission: Secondary | ICD-10-CM | POA: Diagnosis present

## 2021-10-10 DIAGNOSIS — E876 Hypokalemia: Secondary | ICD-10-CM | POA: Diagnosis present

## 2021-10-10 DIAGNOSIS — R7881 Bacteremia: Principal | ICD-10-CM | POA: Diagnosis present

## 2021-10-10 DIAGNOSIS — M4652 Other infective spondylopathies, cervical region: Secondary | ICD-10-CM | POA: Diagnosis present

## 2021-10-10 DIAGNOSIS — M009 Pyogenic arthritis, unspecified: Secondary | ICD-10-CM | POA: Diagnosis present

## 2021-10-10 DIAGNOSIS — M4643 Discitis, unspecified, cervicothoracic region: Secondary | ICD-10-CM | POA: Diagnosis present

## 2021-10-10 DIAGNOSIS — G8929 Other chronic pain: Secondary | ICD-10-CM | POA: Diagnosis present

## 2021-10-10 DIAGNOSIS — B9562 Methicillin resistant Staphylococcus aureus infection as the cause of diseases classified elsewhere: Secondary | ICD-10-CM | POA: Diagnosis present

## 2021-10-10 DIAGNOSIS — Z7151 Drug abuse counseling and surveillance of drug abuser: Secondary | ICD-10-CM

## 2021-10-10 LAB — CBC WITH DIFFERENTIAL/PLATELET
Abs Immature Granulocytes: 0.08 10*3/uL — ABNORMAL HIGH (ref 0.00–0.07)
Basophils Absolute: 0 10*3/uL (ref 0.0–0.1)
Basophils Relative: 0 %
Eosinophils Absolute: 0 10*3/uL (ref 0.0–0.5)
Eosinophils Relative: 0 %
HCT: 41.1 % (ref 39.0–52.0)
Hemoglobin: 13.7 g/dL (ref 13.0–17.0)
Immature Granulocytes: 1 %
Lymphocytes Relative: 5 %
Lymphs Abs: 0.8 10*3/uL (ref 0.7–4.0)
MCH: 29.6 pg (ref 26.0–34.0)
MCHC: 33.3 g/dL (ref 30.0–36.0)
MCV: 88.8 fL (ref 80.0–100.0)
Monocytes Absolute: 1.5 10*3/uL — ABNORMAL HIGH (ref 0.1–1.0)
Monocytes Relative: 9 %
Neutro Abs: 14.8 10*3/uL — ABNORMAL HIGH (ref 1.7–7.7)
Neutrophils Relative %: 85 %
Platelets: 287 10*3/uL (ref 150–400)
RBC: 4.63 MIL/uL (ref 4.22–5.81)
RDW: 12.7 % (ref 11.5–15.5)
WBC: 17.3 10*3/uL — ABNORMAL HIGH (ref 4.0–10.5)
nRBC: 0 % (ref 0.0–0.2)

## 2021-10-10 LAB — BASIC METABOLIC PANEL
Anion gap: 9 (ref 5–15)
BUN: 16 mg/dL (ref 6–20)
CO2: 29 mmol/L (ref 22–32)
Calcium: 9.1 mg/dL (ref 8.9–10.3)
Chloride: 100 mmol/L (ref 98–111)
Creatinine, Ser: 0.79 mg/dL (ref 0.61–1.24)
GFR, Estimated: >60 mL/min (ref >60)
Glucose, Bld: 133 mg/dL — ABNORMAL HIGH (ref 70–99)
Potassium: 3.4 mmol/L — ABNORMAL LOW (ref 3.5–5.1)
Sodium: 138 mmol/L (ref 135–145)

## 2021-10-10 LAB — LACTIC ACID, PLASMA: Lactic Acid, Venous: 1.2 mmol/L (ref 0.5–1.9)

## 2021-10-10 LAB — TROPONIN I (HIGH SENSITIVITY): Troponin I (High Sensitivity): 5 ng/L (ref <18)

## 2021-10-10 LAB — D-DIMER, QUANTITATIVE: D-Dimer, Quant: 1.51 ug/mL-FEU — ABNORMAL HIGH (ref 0.00–0.50)

## 2021-10-10 MED ORDER — HYDROMORPHONE HCL 1 MG/ML IJ SOLN
1.0000 mg | Freq: Once | INTRAMUSCULAR | Status: AC
  Administered 2021-10-10: 1 mg via INTRAVENOUS
  Filled 2021-10-10: qty 1

## 2021-10-10 MED ORDER — GADOBUTROL 1 MMOL/ML IV SOLN
9.0000 mL | Freq: Once | INTRAVENOUS | Status: AC | PRN
  Administered 2021-10-10: 7.5 mL via INTRAVENOUS

## 2021-10-10 MED ORDER — ENOXAPARIN SODIUM 40 MG/0.4ML IJ SOSY
40.0000 mg | PREFILLED_SYRINGE | INTRAMUSCULAR | Status: DC
  Administered 2021-10-10 – 2021-10-31 (×18): 40 mg via SUBCUTANEOUS
  Filled 2021-10-10 (×20): qty 0.4

## 2021-10-10 MED ORDER — ONDANSETRON HCL 4 MG PO TABS: 4.0000 mg | ORAL_TABLET | Freq: Four times a day (QID) | ORAL | Status: DC | PRN

## 2021-10-10 MED ORDER — ONDANSETRON HCL 4 MG/2ML IJ SOLN
4.0000 mg | Freq: Four times a day (QID) | INTRAMUSCULAR | Status: DC | PRN
Start: 2021-10-10 — End: 2021-11-18

## 2021-10-10 MED ORDER — ACETAMINOPHEN 325 MG PO TABS
650.0000 mg | ORAL_TABLET | Freq: Four times a day (QID) | ORAL | Status: DC | PRN
  Administered 2021-10-12 – 2021-10-17 (×2): 650 mg via ORAL
  Filled 2021-10-10 (×3): qty 2

## 2021-10-10 MED ORDER — OXYCODONE-ACETAMINOPHEN 5-325 MG PO TABS
1.0000 | ORAL_TABLET | ORAL | Status: DC | PRN
  Administered 2021-10-10: 1 via ORAL
  Filled 2021-10-10: qty 1

## 2021-10-10 MED ORDER — OXYCODONE HCL 5 MG PO TABS: 5.0000 mg | ORAL_TABLET | ORAL | Status: DC | PRN

## 2021-10-10 MED ORDER — HYDROMORPHONE HCL 1 MG/ML IJ SOLN
1.0000 mg | INTRAMUSCULAR | Status: DC | PRN
  Administered 2021-10-10 – 2021-10-13 (×9): 1 mg via INTRAVENOUS
  Filled 2021-10-10 (×9): qty 1

## 2021-10-10 MED ORDER — ONDANSETRON HCL 4 MG/2ML IJ SOLN
4.0000 mg | Freq: Once | INTRAMUSCULAR | Status: AC
  Administered 2021-10-10: 4 mg via INTRAVENOUS
  Filled 2021-10-10: qty 2

## 2021-10-10 MED ORDER — PIPERACILLIN-TAZOBACTAM 3.375 G IVPB 30 MIN
3.3750 g | Freq: Once | INTRAVENOUS | Status: AC
  Administered 2021-10-10: 3.375 g via INTRAVENOUS
  Filled 2021-10-10: qty 50

## 2021-10-10 MED ORDER — FENTANYL CITRATE PF 50 MCG/ML IJ SOSY
100.0000 ug | PREFILLED_SYRINGE | Freq: Once | INTRAMUSCULAR | Status: AC
  Administered 2021-10-10: 100 ug via INTRAVENOUS
  Filled 2021-10-10: qty 2

## 2021-10-10 MED ORDER — ACETAMINOPHEN 650 MG RE SUPP: 650.0000 mg | Freq: Four times a day (QID) | RECTAL | Status: DC | PRN

## 2021-10-10 MED ORDER — KETOROLAC TROMETHAMINE 30 MG/ML IJ SOLN
30.0000 mg | Freq: Four times a day (QID) | INTRAMUSCULAR | Status: DC | PRN
  Administered 2021-10-11 (×2): 30 mg via INTRAVENOUS
  Filled 2021-10-10 (×2): qty 1

## 2021-10-10 MED ORDER — PIPERACILLIN-TAZOBACTAM 3.375 G IVPB
3.3750 g | Freq: Three times a day (TID) | INTRAVENOUS | Status: DC
Start: 2021-10-10 — End: 2021-10-11
  Administered 2021-10-10: 3.375 g via INTRAVENOUS
  Filled 2021-10-10: qty 50

## 2021-10-10 MED ORDER — ORPHENADRINE CITRATE 30 MG/ML IJ SOLN
60.0000 mg | Freq: Two times a day (BID) | INTRAMUSCULAR | Status: DC
  Administered 2021-10-10 – 2021-10-30 (×31): 60 mg via INTRAVENOUS
  Filled 2021-10-10 (×45): qty 2

## 2021-10-10 MED ORDER — VANCOMYCIN HCL 1500 MG/300ML IV SOLN
1500.0000 mg | Freq: Once | INTRAVENOUS | Status: AC
  Administered 2021-10-10: 1500 mg via INTRAVENOUS
  Filled 2021-10-10: qty 300

## 2021-10-10 MED ORDER — CYCLOBENZAPRINE HCL 10 MG PO TABS
5.0000 mg | ORAL_TABLET | Freq: Three times a day (TID) | ORAL | Status: DC | PRN
  Administered 2021-10-11 – 2021-10-28 (×19): 5 mg via ORAL
  Filled 2021-10-10 (×20): qty 1

## 2021-10-10 MED ORDER — VANCOMYCIN HCL 1250 MG/250ML IV SOLN
1250.0000 mg | Freq: Two times a day (BID) | INTRAVENOUS | Status: DC
  Administered 2021-10-11 – 2021-10-13 (×6): 1250 mg via INTRAVENOUS
  Filled 2021-10-10 (×8): qty 250

## 2021-10-10 MED ORDER — IOHEXOL 350 MG/ML SOLN
75.0000 mL | Freq: Once | INTRAVENOUS | Status: AC | PRN
  Administered 2021-10-10: 75 mL via INTRAVENOUS

## 2021-10-10 NOTE — Assessment & Plan Note (Signed)
Management as above °

## 2021-10-10 NOTE — Assessment & Plan Note (Addendum)
Resolved. -Continue to monitor and replete as needed 

## 2021-10-10 NOTE — ED Notes (Signed)
RN to bedside d/t pt heard yelling in room at nurses station. Pt sitting on side of bed grunting.  This RN offered to help reposition pt and attempted to make comfortable, pt told RN to "leave me alone, just give me a minute, if you understood what kind of pain I was in you would leave me alone". RN explained trying to help make more comfortable and did not want pt to fall. Pt refusing assistance.

## 2021-10-10 NOTE — ED Triage Notes (Signed)
Pt c/o pain in the head, neck and upper back since lifting some tires at work last Thursday, states he took off work on Friday and thought he was feeling better til he went back to work on Monday , went to the chiropractor Tuesday and Wednesday with no relief.

## 2021-10-10 NOTE — ED Notes (Signed)
See triage note. Pt reports hurting back at work on Thursday, will not be workers comp. States came here on Thursday but LWBS d/t not wanting to wait. Reports upper back pain.  Ambulatory.

## 2021-10-10 NOTE — ED Provider Notes (Signed)
[facility] Provider Note    Event Date/Time   First MD Initiated Contact with Patient 10/10/21 1214     (approximate)   History   Back Pain and Neck Pain   HPI  Cameron Hammond is a 46 y.o. male  with history of back pain and as listed in EMR presents to the emergency department for evaluation of headache, neck pain, pain between shoulders and pain in the chest wall with deep inspiration. Symptoms started after changing 4 large truck tires while at work last Thursday. He had pulled the 4th tire off and turned to set it down and felt something pull. He was able to finish his work day but since Thursday has had pain. He has gone to the chiropractor without any relief.       Physical Exam   Triage Vital Signs: ED Triage Vitals [10/10/21 1152]  Enc Vitals Group     BP 125/78     Pulse Rate 88     Resp 18     Temp 97.7 F (36.5 C)     Temp Source Oral     SpO2 100 %     Weight 175 lb (79.4 kg)     Height 5\' 7"  (1.702 m)     Head Circumference      Peak Flow      Pain Score 10     Pain Loc      Pain Edu?      Excl. in GC?     Most recent vital signs: Vitals:   10/10/21 1700 10/10/21 1845  BP: 126/77 136/81  Pulse: (!) 109 (!) 102  Resp: 20 20  Temp:    SpO2: 99% 97%    General: Awake, no distress.  CV:  Good peripheral perfusion.  Resp:  Normal effort.  Breath sounds are clear to auscultation. Abd:  No distention.  Other:  Midline neck tenderness and paracervical tenderness with rotation of head and cervical flexion and extension.   ED Results / Procedures / Treatments   Labs (all labs ordered are listed, but only abnormal results are displayed) Labs Reviewed  D-DIMER, QUANTITATIVE - Abnormal; Notable for the following components:      Result Value   D-Dimer, Quant 1.51 (*)    All other components within normal limits  BASIC METABOLIC PANEL - Abnormal; Notable for the following components:   Potassium 3.4 (*)    Glucose, Bld 133  (*)    All other components within normal limits  CBC WITH DIFFERENTIAL/PLATELET - Abnormal; Notable for the following components:   WBC 17.3 (*)    Neutro Abs 14.8 (*)    Monocytes Absolute 1.5 (*)    Abs Immature Granulocytes 0.08 (*)    All other components within normal limits  CULTURE, BLOOD (ROUTINE X 2)  CULTURE, BLOOD (ROUTINE X 2)  LACTIC ACID, PLASMA  TROPONIN I (HIGH SENSITIVITY)     EKG  ED ECG REPORT I, Tria Noguera, FNP-BC personally viewed and interpreted this ECG.   Date: 10/10/2021  EKG Time: 1404  Rate: 88  Rhythm: normal EKG, normal sinus rhythm  Axis: normal  Intervals:none  ST&T Change: no    RADIOLOGY  Image interpreted and radiology report reviewed by me.  CTA chest shows no evidence of pulmonary embolus.  There is paraspinal soft tissue thickening extending from T2-T5 concerning for osteomyelitis.  CT cervical spine and CT head negative for acute concerns.  Chest x-ray negative for acute findings.  PROCEDURES:  Critical Care performed: No  Procedures   MEDICATIONS ORDERED IN ED: Medications  orphenadrine (NORFLEX) injection 60 mg (60 mg Intravenous Given 10/10/21 1732)  iohexol (OMNIPAQUE) 350 MG/ML injection 75 mL (75 mLs Intravenous Contrast Given 10/10/21 1410)  fentaNYL (SUBLIMAZE) injection 100 mcg (100 mcg Intravenous Given 10/10/21 1517)  ondansetron (ZOFRAN) injection 4 mg (4 mg Intravenous Given 10/10/21 1516)  vancomycin (VANCOREADY) IVPB 1500 mg/300 mL (0 mg Intravenous Stopped 10/10/21 1841)  piperacillin-tazobactam (ZOSYN) IVPB 3.375 g (0 g Intravenous Stopped 10/10/21 1603)  HYDROmorphone (DILAUDID) injection 1 mg (1 mg Intravenous Given 10/10/21 1616)  HYDROmorphone (DILAUDID) injection 1 mg (1 mg Intravenous Given 10/10/21 1843)     IMPRESSION / MDM / ASSESSMENT AND PLAN / ED COURSE   I have reviewed the triage note.  Differential diagnosis includes, but is not limited to exam: Musculoskeletal strain, cervical sprain,  cervical radiculopathy, PE  46 year old male presenting to the emergency department for treatment and evaluation of neck pain that radiates down between his shoulder blades and increases with deep breath.  See HPI for further details.  Symptoms started Thursday after changing large truck tires.  Chiropractor has advised him that he has bone spurs and broken ends of his vertebrae's..  Symptoms are most consistent with a musculoskeletal source of pain, however because he does have pain with deep inspiration between the shoulder blades and into the chest wall, D-dimer has been ordered.  Otherwise he is PERC negative.  Plan will be to get a CT of the head for his ongoing headache, CT cervical spine to evaluate further for injuries reported by chiropractor, and chest x-ray.  D-dimer elevated to 1.51. WBC 17.3 with neutrophil count of 14.8. BMP unremarkable.   CTA of the chest is negative for evidence of PE, however does show concern for osteomyelitis in the area of T2-T5 with recommendation of MRI with contrast.  Because the patient's pain continues from the thoracic spine to the cervical spine, cervical spine will be included in the imaging.  Patient is not having any lumbar pain at this time.  Pain controlled after 1mg  of Dilaudid. Awaiting MRI.  ----------------------------------------- 7:12 PM on 10/10/2021 ----------------------------------------- Patient care transferred to J. 10/12/2021, PA-C who will follow up on MRi and any additional care required.     FINAL CLINICAL IMPRESSION(S) / ED DIAGNOSES   Final diagnoses:  Abnormal computed tomography of thoracic spine     Rx / DC Orders   ED Discharge Orders     None        Note:  This document was prepared using Dragon voice recognition software and may include unintentional dictation errors.   Joseph Art, FNP 10/10/21 1914    10/12/21, MD 10/10/21 [phone]

## 2021-10-10 NOTE — Progress Notes (Signed)
Pharmacy Antibiotic Note  Cameron Hammond is a 46 y.o. male admitted on 10/10/2021 with sepsis.  Pharmacy has been consulted for Vancomycin, Zosyn dosing.  Plan: Zosyn 3.375 gm IV X 1 given over 30 min in ED on 5/25 @ 1531. Zosyn 3.375 gm IV Q8H EI ordered to start on 5/25 @ 2300.  Vancomycin 1500 mg IV X 1 given in ED on 5/25 @ 1614. Vancomycin 1250 mg IV Q12H ordered to start on 5/26 @ 0400.  AUC = 460.9 Vanc trough = 11.9   Height: 5\' 7"  (170.2 cm) Weight: 79.4 kg (175 lb) IBW/kg (Calculated) : 66.1  Temp (24hrs), Avg:97.7 F (36.5 C), Min:97.7 F (36.5 C), Max:97.7 F (36.5 C)  Recent Labs  Lab 10/10/21 1304 10/10/21 1529  WBC 17.3*  --   CREATININE 0.79  --   LATICACIDVEN  --  1.2    Estimated Creatinine Clearance: 117.8 mL/min (by C-G formula based on SCr of 0.79 mg/dL).    No Known Allergies  Antimicrobials this admission:   >>    >>   Dose adjustments this admission:   Microbiology results:  BCx:   UCx:    Sputum:    MRSA PCR:   Thank you for allowing pharmacy to be a part of this patient's care.  Yanelli Zapanta D 10/10/2021 11:01 PM

## 2021-10-10 NOTE — Assessment & Plan Note (Addendum)
States last use was six months ago  Counseled on abstinence

## 2021-10-10 NOTE — ED Notes (Signed)
Awaiting meds from pharmacy  

## 2021-10-10 NOTE — ED Notes (Signed)
EKG to EDP [place] in person.  

## 2021-10-10 NOTE — H&P (Addendum)
History and Physical    Patient: Cameron Hammond NWG:[id number] DOB: Apr 15, 1976 DOA: 10/10/2021 DOS: the patient was seen and examined on 10/10/2021 PCP: Patient, No Pcp Per (Inactive)  Patient coming from: Home  Chief Complaint:  Chief Complaint  Patient presents with   Back Pain   Neck Pain    HPI: Cameron Hammond is a 46 y.o. male with medical history significant for IV drug use who presents to the emergency room with a 1 week history of neck pain radiating down between shoulder blades that started after changing a set of large truck tires at his job.  Pain is worse on inspiration and radiates from base of neck down through his shoulder blades.The intensity is severe and associated with severe neck spasms. He was seen by chiropractor for manipulation but has had no relief.   ED course and data reviewed: Vitals initially normal on arrival but then tachycardic to 109, tachypneic to 21 with BP 110/59 with normal O2 sat on room air.  Labs with WBC 17,000, lactic acid 1.2.  Potassium 3.4 and D-dimer 1.51.  Troponin of 5.  CTA chest was negative for PE but showed concern for osteomyelitis in the area of T2-T5 with recommendation for MRI with contrast.  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 There is also adjacent dorsal and ventral epidural phlegmon measuring up to 2-3 mm in thickness, spanning from T2-T4 ventrally and T1-T5 dorsally.   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 Right sided ventral and lateral epidural phlegmon at C2-C3 measuring up to 2 mm in thickness.   Degenerative changes in the cervical and thoracic spine  as described above.  Patient was treated with several doses of IV Dilaudid for pain control.  He was started on Zosyn and vancomycin with prior blood cultures.  Hospitalist consulted for management of discitis, septic arthritis cervical facet joint and osteomyelitis and other related findings seen on MRI.   Review of Systems: As mentioned in the history of present illness. All other systems reviewed and are negative.  Past Medical History:  Diagnosis Date   Back pain    Chronic back pain    Sinusitis     Social History:  IV drug use, last use 6 months ago    Prior to Admission medications   Medication Sig Start Date End Date Taking? Authorizing Provider  albuterol (PROVENTIL HFA;VENTOLIN HFA) 108 (90 BASE) MCG/ACT inhaler Inhale 2 puffs into the lungs every 6 (six) hours as needed for wheezing. For wheezing Patient not taking: Reported on 10/10/2021    [provider]  albuterol (VENTOLIN HFA) 108 (90 Base) MCG/ACT inhaler Inhale 2 puffs into the lungs every 4 (four) hours as needed for wheezing or shortness of breath. Patient not taking: Reported on 10/10/2021 06/02/20   Delton Prairie, MD  esomeprazole (NEXIUM) 40 MG capsule Take 1 capsule (40 mg total) by mouth daily. Patient not taking: Reported on 10/10/2021 10/01/12   Hayden Rasmussen, NP  hydrOXYzine (ATARAX/VISTARIL) 50 MG tablet Take 50 mg by mouth at bedtime. Patient not taking: Reported on 10/10/2021    [provider]  ibuprofen (ADVIL) 800 MG tablet Take 1 tablet (800 mg total) by mouth every 8 (  eight) hours as needed. Patient not taking: Reported on 10/10/2021 01/23/21   Merwyn KatosBradler, Evan K, MD  Multiple Vitamin (MULTIVITAMIN WITH MINERALS) TABS Take 1 tablet by mouth daily. Patient not taking: Reported on 10/10/2021    [provider]  ondansetron (ZOFRAN ODT) 4 MG disintegrating tablet Take 1 tablet (4 mg total) by mouth every 8 (eight) hours as needed for nausea or vomiting. Patient not taking: Reported on  10/10/2021 01/23/21   Merwyn KatosBradler, Evan K, MD    Physical Exam: Vitals:   10/10/21 1630 10/10/21 1700 10/10/21 1845 10/10/21 2232  BP: 137/73 126/77 136/81 (!) 110/59  Pulse: 98 (!) 109 (!) 102 92  Resp:  20 20 (!) 21  Temp:      TempSrc:      SpO2: 96% 99% 97% 98%  Weight:      Height:       Physical Exam Vitals and nursing note reviewed.  Constitutional:      General: He is not in acute distress.    Comments: Appears in acute pain distress  HENT:     Head: Normocephalic and atraumatic.  Cardiovascular:     Rate and Rhythm: Normal rate and regular rhythm.     Heart sounds: Normal heart sounds.  Pulmonary:     Effort: Pulmonary effort is normal.     Breath sounds: Normal breath sounds.  Abdominal:     Palpations: Abdomen is soft.     Tenderness: There is no abdominal tenderness.  Musculoskeletal:     Cervical back: Spasms and tenderness present. Pain with movement present. Decreased range of motion.     Thoracic back: Tenderness present. No bony tenderness.     Comments: Midline neck tenderness and paracervical tenderness with rotation of head and cervical flexion and extension  Neurological:     Mental Status: Mental status is at baseline.     Data Reviewed: Relevant notes from primary care and specialist visits, past discharge summaries as available in EHR, including Care Everywhere. 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 Neurologic checks q shift and prn  Septic arthritis of cervical spine (HCC) Management as above  Hypokalemia Oral repletion and monitor  History of intravenous drug abuse (HCC) States last use was six months ago Counseled on abstinence   Advance Care Planning:   Code Status: Full Code   Consults: none  Family Communication: none  Severity of Illness: The appropriate patient status for this patient is INPATIENT. Inpatient status is judged to be reasonable and necessary in order to provide the required intensity of service to ensure the patient's safety. The patient's presenting symptoms, physical exam findings, and initial radiographic and laboratory data in the context of their chronic comorbidities is felt to place them at high risk for further clinical deterioration. Furthermore, it is not anticipated that the patient will be medically stable for discharge from the hospital within 2 midnights of admission.   * I certify that at the point of admission it is my clinical  judgment that the patient will require inpatient hospital care spanning beyond 2 midnights from the point of admission due to high intensity of service, high risk for further deterioration and high frequency of surveillance required.*  Author: Andris Baumann, MD 10/10/2021 10:50 PM  For on call review [email or web address].

## 2021-10-10 NOTE — Assessment & Plan Note (Addendum)
Suspect hematogenous seeding from prior IVDU Cx (+)MRSA MRI 05/25 see results   Continue abx. Vancomycin --> Daptomycin. Today 10/15/21 is day 7  Monitor for sepsis criteria: improving WBC and VSS, not meeting sepsis criteria as of 10/11/21   Echocardiogram showed NO vegetations on TTE, should not need TEE since won't change IV abx course.   ID consult: Continue abx for now, repeat BCx pending results. Patient will require long-term antibiotic treatment 6 weeks, however w/ history of IVDU would be reluctant to discharge with venous access, appreciate recs regarding outpatient abx plan on eventual discharge.   Neurosurgical input via secure chat 05/26: MRI no discrete abscess, and no neurologic deficits to warrant emergent intervention, will continue neurologic checks q shift and prn, neurosurgery advises no intervention / no surgical procedure at this time since unlikely to drain anything, if needed could consider IR to sample   Pain control with toradol and oxycodone scheduled, will work on tapering down/off opiates, dilaudid IV for breakthrough but have instructed RN to avoid giving this if possible   Repeat (+)blood cultures 10/13/21, ID following, have asked neurosurgery to reevaluate (secure chat to [name] 05/29)

## 2021-10-10 NOTE — ED Notes (Signed)
Blood cultures obtained before antibiotics started

## 2021-10-10 NOTE — ED Provider Notes (Signed)
  Physical Exam  BP 136/81   Pulse (!) 102   Temp 97.7 F (36.5 C) (Oral)   Resp 20   Ht 5\' 7"  (1.702 m)   Wt 79.4 kg   SpO2 97%   BMI 27.41 kg/m   Physical Exam  Procedures  Procedures  ED Course / MDM    Medical Decision Making Amount and/or Complexity of Data Reviewed Labs: ordered. Radiology: ordered.  Risk Prescription drug management. Decision regarding hospitalization.   Assumed patient care from Rollene Fare, NP.  CT of the cervical and thoracic spine concerning for osteomyelitis primarily at T2-T3.  Patient was accepted for admission under the care of Dr. Damita Dunnings.       Vallarie Mare [place], Hershal Coria 10/10/21 2234    Carrie Mew, MD 10/10/21 2351

## 2021-10-11 ENCOUNTER — Inpatient Hospital Stay (HOSPITAL_COMMUNITY)
Admit: 2021-10-11 | Discharge: 2021-10-11 | Disposition: A | Discharge status: Home / Self Care | Payer: Self-pay | Attending: Osteopathic Medicine | Admitting: Osteopathic Medicine

## 2021-10-11 DIAGNOSIS — M4643 Discitis, unspecified, cervicothoracic region: Secondary | ICD-10-CM

## 2021-10-11 DIAGNOSIS — R7881 Bacteremia: Secondary | ICD-10-CM

## 2021-10-11 DIAGNOSIS — R937 Abnormal findings on diagnostic imaging of other parts of musculoskeletal system: Secondary | ICD-10-CM

## 2021-10-11 LAB — BLOOD CULTURE ID PANEL (REFLEXED) - BCID2

## 2021-10-11 LAB — ECHOCARDIOGRAM COMPLETE
AR max vel: 2.3 cm2
AV Area VTI: 2.26 cm2
AV Area mean vel: 2.1 cm2
AV Mean grad: 3 mmHg
AV Peak grad: 6 mmHg
Ao pk vel: 1.22 m/s
Area-P 1/2: 4.41 cm2
Height: 67 in
MV VTI: 1.97 cm2
S' Lateral: 2.8 cm
Weight: 2800 oz

## 2021-10-11 LAB — CBC
HCT: 38.8 % — ABNORMAL LOW (ref 39.0–52.0)
Hemoglobin: 13.1 g/dL (ref 13.0–17.0)
MCH: 30.3 pg (ref 26.0–34.0)
MCHC: 33.8 g/dL (ref 30.0–36.0)
MCV: 89.6 fL (ref 80.0–100.0)
Platelets: 283 10*3/uL (ref 150–400)
RBC: 4.33 MIL/uL (ref 4.22–5.81)
RDW: 12.6 % (ref 11.5–15.5)
WBC: 15.4 10*3/uL — ABNORMAL HIGH (ref 4.0–10.5)
nRBC: 0 % (ref 0.0–0.2)

## 2021-10-11 LAB — HIV ANTIBODY (ROUTINE TESTING W REFLEX): HIV Screen 4th Generation wRfx: NONREACTIVE

## 2021-10-11 MED ORDER — HYDROMORPHONE HCL 1 MG/ML IJ SOLN
0.5000 mg | Freq: Once | INTRAMUSCULAR | Status: AC
  Administered 2021-10-11: 0.5 mg via INTRAVENOUS
  Filled 2021-10-11: qty 1

## 2021-10-11 MED ORDER — HYDROCODONE-ACETAMINOPHEN 10-325 MG PO TABS
1.0000 | ORAL_TABLET | Freq: Four times a day (QID) | ORAL | Status: AC
Start: 2021-10-11 — End: 2021-10-13
  Administered 2021-10-11 – 2021-10-13 (×8): 1 via ORAL
  Filled 2021-10-11 (×9): qty 1

## 2021-10-11 MED ORDER — KETOROLAC TROMETHAMINE 30 MG/ML IJ SOLN
30.0000 mg | Freq: Three times a day (TID) | INTRAMUSCULAR | Status: AC
  Administered 2021-10-11 – 2021-10-13 (×6): 30 mg via INTRAVENOUS
  Filled 2021-10-11 (×6): qty 1

## 2021-10-11 NOTE — Progress Notes (Signed)
*  PRELIMINARY RESULTS* Echocardiogram 2D Echocardiogram has been performed.  Cristela Blue 10/11/2021, 9:42 AM

## 2021-10-11 NOTE — Progress Notes (Addendum)
PROGRESS NOTE    Cameron Hammond  ION:[id number] DOB: 09-Dec-1975  DOA: 10/10/2021 Date of Service: 10/11/21 PCP: Patient, No Pcp Per (Inactive)     Brief Narrative / Hospital Course:  Cameron Hammond is a 46 year old male PMH back pain, IVDU last use 6 months prior to admission.  Presented to ED 10/10/21 complaining of pain in head, neck, upper back x1 week, no relief with chiropractor. 05/25: In ED CT head/cervical spine and CXR negative, CTA chest no PE, paraspinal soft tissue thickening extending from T2-T5 concerning for osteomyelitis.  WBC 17.3.  Started on vancomycin, Zosyn.  Vital signs initially normal, later transient tachycardia/tachypnea, presumably due to pain, requiring IV Dilaudid for pain control.   MRI thoracic and cervical spine: Discitis/osteomyelitis at 2 to T3, extensive adjacent anterior and lateral paraspinal phlegmon extending to C6 and to T6, dorsal and ventral phlegmon T2-T4 ventrally and T1-T5 dorsally, probable septic arthritis of the right C2-C3 facet joint with adjacent facet osteomyelitis and right paraspinal soft tissue inflammation, right sided ventral and lateral phlegmon C2-C3.   MRSA on blood culture and 3 of 4 bottles, Zosyn was d/c, vancomycin was continued. 05/26: WBC trending down to 15.4  Consultants:  Infectious disease  Procedures: Transthoracic Echo 10/11/21     Subjective: Patient reports pain is not well controlled, esp neck pain. No N/V, no CP/SOB. Denies IVDU in the past 2 years (inconsistent w/ HPI in H&P).      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 Neurosurgical consult: MRI showing no abscess, and no neurologic deficits to warrant emergent interventino, will continue neurologic checks q shift and prn, will consult neurosurgery to advise further  Pain control with toradol, oxycodone and dilaudid for breakthrough, flexeril for spasms.  Septic arthritis of cervical spine (HCC) Management as above  History of intravenous drug abuse (HCC) States last use was six months ago Counseled on abstinence  Hypokalemia Oral repletion and monitor    DVT prophylaxis: lovenox Code Status: FULL Family Communication: none at this time Disposition Plan / TOC needs: remains inpatient for now for IV abx and close monitoring neurological status, no TOC needs at this time Barriers to discharge / significant pending items: see above             Objective: Vitals:   10/10/21 2232 10/10/21 2320 10/11/21 0345 10/11/21 0716  BP: (!) 110/59 119/73 117/65 111/67  Pulse: 92 100 91 72  Resp: (!) Temp:  98.2 F (36.8 C) 98.1 F (36.7 C) 98.6 F (37 C)  TempSrc:      SpO2: 98% 94% 94% 100%  Weight:      Height:        Intake/Output Summary (Last 24 hours)  at 10/11/2021 1233 Last data filed at 10/11/2021 0341 Gross per 24 hour  Intake --  Output 400 ml  Net -400 ml   Filed Weights   10/10/21 1152  Weight: 79.4 kg    Examination:  Constitutional:  VS as above General Appearance: alert, well-developed, well-nourished, NAD Eyes: Normal lids and  conjunctive, non-icteric sclera Ears, Nose, Mouth, Throat: Normal appearance Neck: No masses, trachea midline Respiratory: Normal respiratory effort Cardiovascular: S1/S2 normal, RRR No lower extremity edema Musculoskeletal:  No clubbing/cyanosis of digits Neurological: No cranial nerve deficit on limited exam Motor and sensation intact and symmetric Psychiatric: Normal judgment/insight Normal mood and affect       Scheduled Medications:   enoxaparin (LOVENOX) injection  40 mg Subcutaneous Q24H   HYDROcodone-acetaminophen  1 tablet Oral Q6H   orphenadrine  60 mg Intravenous Q12H    Continuous Infusions:  vancomycin 1,250 mg (10/11/21 0341)    PRN Medications:  acetaminophen **OR** acetaminophen, cyclobenzaprine, HYDROmorphone (DILAUDID) injection, ketorolac, ondansetron **OR** ondansetron (ZOFRAN) IV  Antimicrobials:  Anti-infectives (From admission, onward)    Start     Dose/Rate Route Frequency Ordered Stop   10/11/21 0400  vancomycin (VANCOREADY) IVPB 1250 mg/250 mL        1,250 mg 166.7 mL/hr over 90 Minutes Intravenous Every 12 hours 10/10/21 2300     10/10/21 2300  piperacillin-tazobactam (ZOSYN) IVPB 3.375 g  Status:  Discontinued        3.375 g 12.5 mL/hr over 240 Minutes Intravenous Every 8 hours 10/10/21 2258 10/11/21 0612   10/10/21 1530  vancomycin (VANCOREADY) IVPB 1500 mg/300 mL        1,500 mg 150 mL/hr over 120 Minutes Intravenous  Once 10/10/21 1517 10/10/21 1841   10/10/21 1530  piperacillin-tazobactam (ZOSYN) IVPB 3.375 g        3.375 g 100 mL/hr over 30 Minutes Intravenous  Once 10/10/21 1517 10/10/21 1603       Data Reviewed: I have personally reviewed following labs and imaging studies  CBC: Recent Labs  Lab 10/10/21 1304 10/11/21 0554  WBC 17.3* 15.4*  NEUTROABS 14.8*  --   HGB 13.7 13.1  HCT 41.1 38.8*  MCV 88.8 89.6  PLT 287 283   Basic Metabolic Panel: Recent Labs  Lab 10/10/21 1304  NA 138  K 3.4*  CL 100  CO2 29   GLUCOSE 133*  BUN 16  CREATININE 0.79  CALCIUM 9.1   GFR: Estimated Creatinine Clearance: 117.8 mL/min (by C-G formula based on SCr of 0.79 mg/dL). Liver Function Tests: No results for input(s): AST, ALT, ALKPHOS, BILITOT, PROT, ALBUMIN in the last 168 hours. No results for input(s): LIPASE, AMYLASE in the last 168 hours. No results for input(s): AMMONIA in the last 168 hours. Coagulation Profile: No results for input(s): INR, PROTIME in the last 168 hours. Cardiac Enzymes: No results for input(s): CKTOTAL, CKMB, CKMBINDEX, TROPONINI in the last 168 hours. BNP (last 3 results) No results for input(s): PROBNP in the last 8760 hours. HbA1C: No results for input(s): HGBA1C in the last 72 hours. CBG: No results for input(s): GLUCAP in the last 168 hours. Lipid Profile: No results for input(s): CHOL, HDL, LDLCALC, TRIG, CHOLHDL, LDLDIRECT in the last 72 hours. Thyroid Function Tests: No results for input(s): TSH, T4TOTAL, FREET4, T3FREE, THYROIDAB in the last 72 hours. Anemia Panel: No results for input(s): VITAMINB12, FOLATE, FERRITIN, TIBC, IRON, RETICCTPCT in the last 72 hours. Urine analysis:    Component Value Date/Time   COLORURINE AMBER (A) 11/19/2012  1957   APPEARANCEUR CLEAR 11/19/2012 1957   LABSPEC 1.027 11/19/2012 1957   PHURINE 6.5 11/19/2012 1957   GLUCOSEU NEGATIVE 11/19/2012 1957   HGBUR NEGATIVE 11/19/2012 1957   BILIRUBINUR SMALL (A) 11/19/2012 1957   KETONESUR 15 (A) 11/19/2012 1957   PROTEINUR 30 (A) 11/19/2012 1957   UROBILINOGEN 1.0 11/19/2012 1957   NITRITE NEGATIVE 11/19/2012 1957   LEUKOCYTESUR NEGATIVE 11/19/2012 1957   Sepsis Labs: @LABRCNTIP (procalcitonin:4,lacticidven:4)  Recent Results (from the past 240 hour(s))  Blood culture (routine x 2)     Status: None (Preliminary result)   Collection Time: 10/10/21  3:19 PM   Specimen: BLOOD LEFT FOREARM  Result Value Ref Range Status   Specimen Description BLOOD LEFT FOREARM  Final   Special  Requests   Final    BOTTLES DRAWN AEROBIC AND ANAEROBIC Blood Culture adequate volume   Culture  Setup Time   Final    Organism ID to follow GRAM POSITIVE COCCI IN BOTH AEROBIC AND ANAEROBIC BOTTLES CRITICAL RESULT CALLED TO, READ BACK BY AND VERIFIED WITH: JASON ROBBINS AT [id number] 10/11/21.PMF Performed at [facility], [address] [place], [place] [address]    Culture [facility]GRAM POSITIVE COCCI  Final   Report Status PENDING  Incomplete  Blood Culture ID Panel (Reflexed)     Status: Abnormal   Collection Time: 10/10/21  3:19 PM  Result Value Ref Range Status   Enterococcus faecalis NOT DETECTED NOT DETECTED Final   Enterococcus Faecium NOT DETECTED NOT DETECTED Final   Listeria monocytogenes NOT DETECTED NOT DETECTED Final   Staphylococcus species DETECTED (A) NOT DETECTED Final    Comment: CRITICAL RESULT CALLED TO, READ BACK BY AND VERIFIED WITH: JASON ROBBINS AT 0608 10/11/21.PMF    Staphylococcus aureus (BCID) DETECTED (A) NOT DETECTED Final    Comment: Methicillin (oxacillin)-resistant Staphylococcus aureus (MRSA). MRSA is predictably resistant to beta-lactam antibiotics (except ceftaroline). Preferred therapy is vancomycin unless clinically contraindicated. Patient requires contact precautions if  hospitalized. CRITICAL RESULT CALLED TO, READ BACK BY AND VERIFIED WITH: JASON ROBBINS AT [phone] 10/11/21.PMF    Staphylococcus epidermidis NOT DETECTED NOT DETECTED Final   Staphylococcus lugdunensis NOT DETECTED NOT DETECTED Final   Streptococcus species NOT DETECTED NOT DETECTED Final   Streptococcus agalactiae NOT DETECTED NOT DETECTED Final   Streptococcus pneumoniae NOT DETECTED NOT DETECTED Final   Streptococcus pyogenes NOT DETECTED NOT DETECTED Final   A.calcoaceticus-baumannii NOT DETECTED NOT DETECTED Final   Bacteroides fragilis NOT DETECTED NOT DETECTED Final   Enterobacterales NOT DETECTED NOT DETECTED Final   Enterobacter cloacae complex NOT DETECTED NOT DETECTED  Final   Escherichia coli NOT DETECTED NOT DETECTED Final   Klebsiella aerogenes NOT DETECTED NOT DETECTED Final   Klebsiella oxytoca NOT DETECTED NOT DETECTED Final   Klebsiella pneumoniae NOT DETECTED NOT DETECTED Final   Proteus species NOT DETECTED NOT DETECTED Final   Salmonella species NOT DETECTED NOT DETECTED Final   Serratia marcescens NOT DETECTED NOT DETECTED Final   Haemophilus influenzae NOT DETECTED NOT DETECTED Final   Neisseria meningitidis NOT DETECTED NOT DETECTED Final   Pseudomonas aeruginosa NOT DETECTED NOT DETECTED Final   Stenotrophomonas maltophilia NOT DETECTED NOT DETECTED Final   Candida albicans NOT DETECTED NOT DETECTED Final   Candida auris NOT DETECTED NOT DETECTED Final   Candida glabrata NOT DETECTED NOT DETECTED Final   Candida krusei NOT DETECTED NOT DETECTED Final   Candida parapsilosis NOT DETECTED NOT DETECTED Final   Candida tropicalis NOT DETECTED NOT DETECTED Final   Cryptococcus  neoformans/gattii NOT DETECTED NOT DETECTED Final   Meth resistant mecA/C and MREJ DETECTED (A) NOT DETECTED Final    Comment: CRITICAL RESULT CALLED TO, READ BACK BY AND VERIFIED WITH: JASON ROBBINS AT 0608 10/11/21.PMF Performed at [facility], [address] [place], [place] [address]   Blood culture (routine x 2)     Status: None (Preliminary result)   Collection Time: 10/10/21  3:24 PM   Specimen: Right Antecubital; Blood  Result Value Ref Range Status   Specimen Description RIGHT ANTECUBITAL  Final   Special Requests   Final    BOTTLES DRAWN AEROBIC AND ANAEROBIC Blood Culture adequate volume   Culture  Setup Time   Final    GRAM POSITIVE COCCI IN BOTH AEROBIC AND ANAEROBIC BOTTLES CRITICAL RESULT CALLED TO, READ BACK BY AND VERIFIED WITH: JASON ROBBINS AT 6045 10/11/21.PMF Performed at [facility], [address] [place], [place] [address]    Culture [facility] POSITIVE COCCI  Final   Report Status PENDING  Incomplete          Radiology Studies last 96 hours: DG Chest 2 View  Result Date: 10/10/2021 CLINICAL DATA:  Chest pain. EXAM: CHEST - 2 VIEW COMPARISON:  06/02/2020 and prior studies FINDINGS: The cardiomediastinal silhouette is unremarkable. There is no evidence of focal airspace disease, pulmonary edema, suspicious pulmonary nodule/mass, pleural effusion, or pneumothorax. No acute bony abnormalities are identified. IMPRESSION: No active cardiopulmonary disease. Electronically Signed   By: Harmon Pier M.D.   On: 10/10/2021 13:37   CT Head Wo Contrast  Result Date: 10/10/2021 CLINICAL DATA:  Headache, new or worsening headache in 46 year old male. EXAM: CT HEAD WITHOUT CONTRAST CT CERVICAL SPINE WITHOUT CONTRAST TECHNIQUE: Multidetector CT imaging of the head and cervical spine was performed following the standard protocol without intravenous contrast. Multiplanar CT image reconstructions of the cervical spine were also generated. RADIATION DOSE REDUCTION: This exam was performed according to the departmental dose-optimization program which includes automated exposure control, adjustment of the mA and/or kV according to patient size and/or use of iterative reconstruction technique. COMPARISON:  July 4th of 2014 CT of the face. FINDINGS: CT HEAD FINDINGS Brain: No evidence of acute infarction, hemorrhage, hydrocephalus, extra-axial collection or mass lesion/mass effect. Vascular: No hyperdense vessel or unexpected calcification. Skull: Normal. Negative for fracture or focal lesion. Sinuses/Orbits: Visualized paranasal sinuses and orbits are unremarkable to the extent evaluated. Other: None CT CERVICAL SPINE FINDINGS Alignment: Mild anterolisthesis of C2 on C3 approximately 2 mm, associated with facet arthropathy that is greatest on the RIGHT. Skull base and vertebrae: No acute fracture. No primary bone lesion or focal pathologic process. Soft tissues and spinal canal: No prevertebral fluid or swelling. No visible  canal hematoma. Disc levels: Multilevel degenerative changes in the cervical spine. Facet arthropathy greatest at C2-3 as discussed. Multiple levels of facet arthropathy and multiple levels of disc space narrowing. Disc space narrowing and osteophyte formation greatest in mid cervical spine and lower cervical spine at C5-6 and C6-7. Upper chest: Negative. Other: None IMPRESSION: 1. No acute intracranial abnormality. 2. No acute fracture or traumatic subluxation of the cervical spine. 3. Multilevel degenerative changes in the cervical spine. Electronically Signed   By: Donzetta Kohut M.D.   On: 10/10/2021 13:37   CT Angio Chest PE W and/or Wo Contrast  Result Date: 10/10/2021 CLINICAL DATA:  Upper back pain EXAM: CT ANGIOGRAPHY CHEST WITH CONTRAST TECHNIQUE: Multidetector CT imaging of the chest was performed using the standard protocol during bolus administration  of intravenous contrast. Multiplanar CT image reconstructions and MIPs were obtained to evaluate the vascular anatomy. RADIATION DOSE REDUCTION: This exam was performed according to the departmental dose-optimization program which includes automated exposure control, adjustment of the mA and/or kV according to patient size and/or use of iterative reconstruction technique. CONTRAST:  75mL OMNIPAQUE IOHEXOL 350 MG/ML SOLN COMPARISON:  None Available. FINDINGS: Cardiovascular: Normal heart size. No pericardial effusion. Normal caliber thoracic aorta with no significant atherosclerotic disease. Adequate contrast opacification of the pulmonary arteries. No evidence of pulmonary embolus, although evaluation of the lower lobe segmental and subsegmental pulmonary arteries is limited due to respiratory motion artifact. Mediastinum/Nodes: Esophagus and thyroid are unremarkable. No pathologically enlarged lymph nodes seen in the chest. Lungs/Pleura: Central airways are patent. No consolidation, pleural effusion or pneumothorax. Mild bibasilar atelectasis. Upper  Abdomen: No acute abnormality. Musculoskeletal: Paraspinal soft tissue thickening extending from T2-T5. Schmorl's node involving the inferior endplate of T2. No aggressive appearing osseous lesions. Review of the MIP images confirms the above findings. IMPRESSION: 1. No evidence of pulmonary embolus. 2. Paraspinal soft tissue thickening extending from T2-T5, concerning for osteomyelitis. Recommend contrast-enhanced MRI of the thoracic spine for further evaluation. Electronically Signed   By: Allegra Lai M.D.   On: 10/10/2021 14:36   CT Cervical Spine Wo Contrast  Result Date: 10/10/2021 CLINICAL DATA:  Headache, new or worsening headache in 46 year old male. EXAM: CT HEAD WITHOUT CONTRAST CT CERVICAL SPINE WITHOUT CONTRAST TECHNIQUE: Multidetector CT imaging of the head and cervical spine was performed following the standard protocol without intravenous contrast. Multiplanar CT image reconstructions of the cervical spine were also generated. RADIATION DOSE REDUCTION: This exam was performed according to the departmental dose-optimization program which includes automated exposure control, adjustment of the mA and/or kV according to patient size and/or use of iterative reconstruction technique. COMPARISON:  July 4th of 2014 CT of the face. FINDINGS: CT HEAD FINDINGS Brain: No evidence of acute infarction, hemorrhage, hydrocephalus, extra-axial collection or mass lesion/mass effect. Vascular: No hyperdense vessel or unexpected calcification. Skull: Normal. Negative for fracture or focal lesion. Sinuses/Orbits: Visualized paranasal sinuses and orbits are unremarkable to the extent evaluated. Other: None CT CERVICAL SPINE FINDINGS Alignment: Mild anterolisthesis of C2 on C3 approximately 2 mm, associated with facet arthropathy that is greatest on the RIGHT. Skull base and vertebrae: No acute fracture. No primary bone lesion or focal pathologic process. Soft tissues and spinal canal: No prevertebral fluid or  swelling. No visible canal hematoma. Disc levels: Multilevel degenerative changes in the cervical spine. Facet arthropathy greatest at C2-3 as discussed. Multiple levels of facet arthropathy and multiple levels of disc space narrowing. Disc space narrowing and osteophyte formation greatest in mid cervical spine and lower cervical spine at C5-6 and C6-7. Upper chest: Negative. Other: None IMPRESSION: 1. No acute intracranial abnormality. 2. No acute fracture or traumatic subluxation of the cervical spine. 3. Multilevel degenerative changes in the cervical spine. Electronically Signed   By: Donzetta Kohut M.D.   On: 10/10/2021 13:37   MR Cervical Spine W or Wo Contrast  Result Date: 10/10/2021 CLINICAL DATA:  Myelopathy, acute, cervical spine; Myelopathy, acute, thoracic spine EXAM: MRI CERVICAL AND THORACIC SPINE WITHOUT AND WITH CONTRAST TECHNIQUE: Multiplanar and multiecho pulse sequences of the cervical spine, to include the craniocervical junction and cervicothoracic junction, and the thoracic spine, were obtained without and with intravenous contrast. CONTRAST:  7.22mL GADAVIST GADOBUTROL 1 MMOL/ML IV SOLN COMPARISON:  Same day CT chest FINDINGS: MRI CERVICAL SPINE FINDINGS Motion degraded exam.  Alignment: Trace anterolisthesis at C2-C3. Vertebrae: No evidence of acute fracture or aggressive osseous lesion. Cord: Normal signal and morphology. Posterior Fossa, vertebral arteries, paraspinal tissues: Preserved vertebral artery flow voids. The posterior fossa is unremarkable. There is soft tissue edema and enhancement along the right posterior paraspinal soft tissues adjacent to the right C2-C3 facet. There is paraspinal soft tissue edema along the upper thoracic spine extending into the anterior lower aspect of the cervical spine to level of C6, described below. Disc levels: The craniocervical junction is unremarkable. C2-C3: Trace degenerative anterolisthesis. Advanced right-sided facet arthropathy with  cortical irregularity, periarticular marrow edema and enhancement, and a right-sided facet effusion. Mild bilateral neural foraminal stenosis.Minimal spinal canal narrowing. Right sided ventral and lateral epidural phlegmon measuring up to 2 mm in thicknes C3-C4: Minimal disc bulging. Minimal spinal canal narrowing. Mild bilateral facet arthropathy.Moderate left neural foraminal stenosis. No right neural foraminal stenosis. C4-C5: Asymmetric right disc bulging. Mild bilateral facet arthropathy. Minimal canal narrowing. Mild right neural foraminal stenosis. No left neural foraminal stenosis. C5-C6: Posterior disc osteophyte complex and uncovertebral joint hypertrophy. Mild spinal canal stenosis. Moderate bilateral neural foraminal stenosis, left greater than right. C6-C7: Posterior disc osteophyte complex. Minimal canal narrowing. Mild bilateral neural foraminal stenosis. C7-T1: No significant spinal canal or neural foraminal narrowing. MRI THORACIC SPINE FINDINGS Alignment:  Physiologic. Vertebrae: There is endplate irregularity, marrow edema and enhancement at T2-T3 with minimally increased abnormal signal in the intervertebral disc. Focal T1 and T2 hyperintense lesion within the posterior aspect of the T10 vertebral body on the left near the pedicle with central signal dropout on STIR likely an atypical hemangioma. Scattered additional degenerative endplate changes. Cord: No definite abnormal cord signal. Paraspinal and other soft tissues: There is extensive anterior and lateral paraspinal soft tissue edema and enhancement spanning T2 through T6 consistent with phlegmon. There is thin phlegmonous extension into the anterior paraspinal soft tissues of the lower cervical spine to the level of C6 (cervical spine MRI sagittal postcontrast image 3). Disc levels: There is disc bulging at T2-T3, asymmetric right resulting in moderate to severe right-sided neural foraminal stenosis. There is ventral and dorsal epidural  phlegmon spanning from T2-T4 ventrally and T1-T5 dorsally, measuring up to 2-3 mm and thickness. There is multilevel disc desiccation throughout the thoracic spine, with additional mild disc bulging at T3-T4 resulting in mild bilateral neural foraminal stenosis and minimal disc bulging at T10-T11 and T11-T12 resulting in no significant stenosis. 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 There is also adjacent dorsal and ventral epidural phlegmon measuring up to 2-3 mm in thickness, spanning from T2-T4 ventrally and T1-T5 dorsally. 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 Right sided ventral and lateral epidural phlegmon at C2-C3 measuring up to 2 mm in thickness. Degenerative changes in the cervical and thoracic spine as described above. Electronically Signed   By: Caprice Renshaw M.D.   On: 10/10/2021 22:02   MR THORACIC SPINE W WO CONTRAST  Result Date: 10/10/2021 CLINICAL DATA:  Myelopathy, acute, cervical spine; Myelopathy, acute, thoracic spine EXAM: MRI CERVICAL AND THORACIC SPINE WITHOUT AND WITH CONTRAST TECHNIQUE: Multiplanar and multiecho pulse sequences of the cervical spine, to include the craniocervical junction and cervicothoracic junction, and the thoracic spine, were obtained without and with  intravenous contrast. CONTRAST:  7.96mL GADAVIST GADOBUTROL 1 MMOL/ML IV SOLN COMPARISON:  Same day CT chest FINDINGS: MRI CERVICAL SPINE FINDINGS Motion degraded exam. Alignment: Trace anterolisthesis at C2-C3. Vertebrae: No evidence of acute fracture or aggressive osseous lesion. Cord: Normal signal and morphology. Posterior Fossa, vertebral arteries, paraspinal tissues: Preserved vertebral artery flow voids. The  posterior fossa is unremarkable. There is soft tissue edema and enhancement along the right posterior paraspinal soft tissues adjacent to the right C2-C3 facet. There is paraspinal soft tissue edema along the upper thoracic spine extending into the anterior lower aspect of the cervical spine to level of C6, described below. Disc levels: The craniocervical junction is unremarkable. C2-C3: Trace degenerative anterolisthesis. Advanced right-sided facet arthropathy with cortical irregularity, periarticular marrow edema and enhancement, and a right-sided facet effusion. Mild bilateral neural foraminal stenosis.Minimal spinal canal narrowing. Right sided ventral and lateral epidural phlegmon measuring up to 2 mm in thicknes C3-C4: Minimal disc bulging. Minimal spinal canal narrowing. Mild bilateral facet arthropathy.Moderate left neural foraminal stenosis. No right neural foraminal stenosis. C4-C5: Asymmetric right disc bulging. Mild bilateral facet arthropathy. Minimal canal narrowing. Mild right neural foraminal stenosis. No left neural foraminal stenosis. C5-C6: Posterior disc osteophyte complex and uncovertebral joint hypertrophy. Mild spinal canal stenosis. Moderate bilateral neural foraminal stenosis, left greater than right. C6-C7: Posterior disc osteophyte complex. Minimal canal narrowing. Mild bilateral neural foraminal stenosis. C7-T1: No significant spinal canal or neural foraminal narrowing. MRI THORACIC SPINE FINDINGS Alignment:  Physiologic. Vertebrae: There is endplate irregularity, marrow edema and enhancement at T2-T3 with minimally increased abnormal signal in the intervertebral disc. Focal T1 and T2 hyperintense lesion within the posterior aspect of the T10 vertebral body on the left near the pedicle with central signal dropout on STIR likely an atypical hemangioma. Scattered additional degenerative endplate changes. Cord: No definite abnormal cord signal. Paraspinal and other soft tissues: There is  extensive anterior and lateral paraspinal soft tissue edema and enhancement spanning T2 through T6 consistent with phlegmon. There is thin phlegmonous extension into the anterior paraspinal soft tissues of the lower cervical spine to the level of C6 (cervical spine MRI sagittal postcontrast image 3). Disc levels: There is disc bulging at T2-T3, asymmetric right resulting in moderate to severe right-sided neural foraminal stenosis. There is ventral and dorsal epidural phlegmon spanning from T2-T4 ventrally and T1-T5 dorsally, measuring up to 2-3 mm and thickness. There is multilevel disc desiccation throughout the thoracic spine, with additional mild disc bulging at T3-T4 resulting in mild bilateral neural foraminal stenosis and minimal disc bulging at T10-T11 and T11-T12 resulting in no significant stenosis. 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 There is also adjacent dorsal and ventral epidural phlegmon measuring up to 2-3 mm in thickness, spanning from T2-T4 ventrally and T1-T5 dorsally. 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 Right sided ventral and lateral epidural phlegmon at C2-C3 measuring up to 2 mm in thickness. Degenerative changes in the cervical and thoracic spine as described above. Electronically Signed   By: Caprice Renshaw M.D.   On: 10/10/2021 22:02            LOS: 1 day    Time spent: 35 min    Sunnie Nielsen, DO [facility] 10/11/2021, 12:33 PM   Staff  may message me via secure chat in Epic  but this may not receive immediate response,  please page for urgent matters!  If 7PM-7AM, please contact night-coverage [email or web address]  Dictation software was used to generate  the above note. Typos may occur and escape review, as with typed/written notes. Please contact Dr Lyn Hollingshead directly for clarity if needed.

## 2021-10-11 NOTE — Consult Note (Signed)
No immediate intervention needed. Follow neurological examinations. Can try soft collar or collar for comfort. Defer to medical management.   Consulting Department: Emergency department  Primary Physician:  Patient, No Pcp Per (Inactive)  Chief Complaint: Pain  History of Present Illness: Pleasant 46 year old man with a history of IV drug abuse.  Has had chronic pain, however had worsening in the past 1 to 2 weeks.  His upper back and shoulder blades.  On evaluation was found to have discitis/osteomyelitis.  Denies any weakness numbness or tingling.  Denies any trouble with his bowel or bladder.  Did not have any new neurologic symptoms.  Mostly just pain in the upper back/lower neck.  Review of Systems:  A 10 point review of systems is negative, except for the pertinent positives and negatives detailed in the HPI.  Past Medical History: Past Medical History:  Diagnosis Date   Back pain    Chronic back pain    Sinusitis     Past Surgical History: History reviewed. No pertinent surgical history.  Allergies: Allergies as of 10/10/2021   (No Known Allergies)    Medications:  Current Facility-Administered Medications:    acetaminophen (TYLENOL) tablet 650 mg, 650 mg, Oral, Q6H PRN **OR** acetaminophen (TYLENOL) suppository 650 mg, 650 mg, Rectal, Q6H PRN, Andris Baumann, MD   cyclobenzaprine (FLEXERIL) tablet 5 mg, 5 mg, Oral, TID PRN, Andris Baumann, MD, 5 mg at 10/11/21 0058   enoxaparin (LOVENOX) injection 40 mg, 40 mg, Subcutaneous, Q24H, Andris Baumann, MD, 40 mg at 10/10/21 2333   HYDROcodone-acetaminophen (NORCO) 10-325 MG per tablet 1 tablet, 1 tablet, Oral, Q6H, Sunnie Nielsen, DO, 1 tablet at 10/11/21 1437   HYDROmorphone (DILAUDID) injection 1 mg, 1 mg, Intravenous, Q3H PRN, Andris Baumann, MD, 1 mg at 10/10/21 2332   ketorolac (TORADOL) 30 MG/ML injection 30 mg, 30 mg, Intravenous, Q6H PRN, Andris Baumann, MD, 30 mg at 10/11/21 0934   ondansetron (ZOFRAN)  tablet 4 mg, 4 mg, Oral, Q6H PRN **OR** ondansetron (ZOFRAN) injection 4 mg, 4 mg, Intravenous, Q6H PRN, Andris Baumann, MD   orphenadrine (NORFLEX) injection 60 mg, 60 mg, Intravenous, Q12H, Triplett, Cari B, FNP, 60 mg at 10/11/21 0457   vancomycin (VANCOREADY) IVPB 1250 mg/250 mL, 1,250 mg, Intravenous, Q12H, Andris Baumann, MD, Stopped at 10/11/21 [phone]   Social History: Social History   Tobacco Use   Smoking status: Every Day   Smokeless tobacco: Never  Substance Use Topics   Alcohol use: Not Currently   Drug use: No    Family Medical History: No family history on file.  Physical Examination: Vitals:   10/11/21 0345 10/11/21 0716  BP: 117/65 111/67  Pulse: 91 72  Resp: 15 20  Temp: 98.1 F (36.7 C) 98.6 F (37 C)  SpO2: 94% 100%     General: Patient is well developed, well nourished, in pain/apparent distress.  General: In pain with excess movements. Awake, alert, oriented to person, place, and time.  Pupils equal round and reactive to light.  Facial tone is symmetric.  Tongue protrusion is midline.  There is no pronator drift.  ROM of spine: Pain to palpation throughout the upper T spine.  Strength: Side Biceps Triceps Deltoid Interossei Grip Wrist Ext. Wrist Flex.  R 5 5 5 5 5 5 5   L 5 5 5 5 5 5 5    Side Iliopsoas Quads Hamstring PF DF EHL  R 5 5 5 5 5 5   L 5 5 5  5  5 5    Bilateral upper and lower extremity sensation is intact to light touch. Reflexes are 2+ and symmetric at the biceps, triceps, brachioradialis, patella and achilles. Hoffman's is absent.  Clonus is not present.  Toes are down-going.    Gait is deferred due to pain level.  Imaging:   Narrative & Impression  CLINICAL DATA:  Myelopathy, acute, cervical spine; Myelopathy, acute, thoracic spine   EXAM: MRI CERVICAL AND THORACIC SPINE WITHOUT AND WITH CONTRAST   TECHNIQUE: Multiplanar and multiecho pulse sequences of the cervical spine, to include the craniocervical junction and  cervicothoracic junction, and the thoracic spine, were obtained without and with intravenous contrast.   CONTRAST:  7.385mL GADAVIST GADOBUTROL 1 MMOL/ML IV SOLN   COMPARISON:  Same day CT chest   FINDINGS: MRI CERVICAL SPINE FINDINGS   Motion degraded exam.   Alignment: Trace anterolisthesis at C2-C3.   Vertebrae: No evidence of acute fracture or aggressive osseous lesion.   Cord: Normal signal and morphology.   Posterior Fossa, vertebral arteries, paraspinal tissues: Preserved vertebral artery flow voids. The posterior fossa is unremarkable. There is soft tissue edema and enhancement along the right posterior paraspinal soft tissues adjacent to the right C2-C3 facet. There is paraspinal soft tissue edema along the upper thoracic spine extending into the anterior lower aspect of the cervical spine to level of C6, described below.   Disc levels: The craniocervical junction is unremarkable.   C2-C3: Trace degenerative anterolisthesis. Advanced right-sided facet arthropathy with cortical irregularity, periarticular marrow edema and enhancement, and a right-sided facet effusion. Mild bilateral neural foraminal stenosis.Minimal spinal canal narrowing. Right sided ventral and lateral epidural phlegmon measuring up to 2 mm in thicknes   C3-C4: Minimal disc bulging. Minimal spinal canal narrowing. Mild bilateral facet arthropathy.Moderate left neural foraminal stenosis. No right neural foraminal stenosis.   C4-C5: Asymmetric right disc bulging. Mild bilateral facet arthropathy. Minimal canal narrowing. Mild right neural foraminal stenosis. No left neural foraminal stenosis.   C5-C6: Posterior disc osteophyte complex and uncovertebral joint hypertrophy. Mild spinal canal stenosis. Moderate bilateral neural foraminal stenosis, left greater than right.   C6-C7: Posterior disc osteophyte complex. Minimal canal narrowing. Mild bilateral neural foraminal stenosis.   C7-T1: No  significant spinal canal or neural foraminal narrowing.   MRI THORACIC SPINE FINDINGS   Alignment:  Physiologic.   Vertebrae: There is endplate irregularity, marrow edema and enhancement at T2-T3 with minimally increased abnormal signal in the intervertebral disc. Focal T1 and T2 hyperintense lesion within the posterior aspect of the T10 vertebral body on the left near the pedicle with central signal dropout on STIR likely an atypical hemangioma. Scattered additional degenerative endplate changes.   Cord: No definite abnormal cord signal.   Paraspinal and other soft tissues: There is extensive anterior and lateral paraspinal soft tissue edema and enhancement spanning T2 through T6 consistent with phlegmon. There is thin phlegmonous extension into the anterior paraspinal soft tissues of the lower cervical spine to the level of C6 (cervical spine MRI sagittal postcontrast image 3).   Disc levels: There is disc bulging at T2-T3, asymmetric right resulting in moderate to severe right-sided neural foraminal stenosis. There is ventral and dorsal epidural phlegmon spanning from T2-T4 ventrally and T1-T5 dorsally, measuring up to 2-3 mm and thickness.   There is multilevel disc desiccation throughout the thoracic spine, with additional mild disc bulging at T3-T4 resulting in mild bilateral neural foraminal stenosis and minimal disc bulging at T10-T11 and T11-T12 resulting in no significant stenosis.  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 There is also adjacent dorsal and ventral epidural phlegmon measuring up to 2-3 mm in thickness, spanning from T2-T4 ventrally and T1-T5 dorsally.   In addition, there is probable septic arthritis of the right C2-C3 facet joint with adjacent facet osteomyelitis and right  paraspinal soft tissue inflammation at this level. Right sided ventral and lateral epidural phlegmon at C2-C3 measuring up to 2 mm in thickness.   Degenerative changes in the cervical and thoracic spine as described above.     Electronically Signed   By: Caprice Renshaw M.D.   On: 10/10/2021 22:02    I have personally reviewed the images and agree with the above interpretation.  Labs:    Latest Ref Rng & Units 10/11/2021    5:54 AM 10/10/2021    1:04 PM 11/19/2012    7:46 PM  CBC  WBC 4.0 - 10.5 K/uL 15.4   17.3   14.2    Hemoglobin 13.0 - 17.0 g/dL 28.3   15.1   76.1    Hematocrit 39.0 - 52.0 % 38.8   41.1   38.9    Platelets 150 - 400 K/uL 283   287   213         Assessment and Plan: Mr. Ludlum is a pleasant 46 y.o. male with a history of IV drug abuse, and osteomyelitis/discitis.  He had a worsening of his neck and shoulder pain.  This became quite severe after lifting heavily.  He has had chiropractor work.  Initial concern was for possible PE however on imaging was found to have a paraspinal infection.  On physical exam he has full strength with no evidence of myelopathy.  On his MRI he has discitis osteomyelitis as noted above, and septic arthritis as noted above.  The intracanalicular portion is quite minimal with a thin phlegmon of contrast-enhancement.  The vast majority of the contrast-enhancement is paraspinal has in the picture above.  At this time we do not feel that a laminectomy washout and possible spinal fusion would be of benefit.  Continue to manage medically.  If nonresponsive to antibiotics could consider a IR guided biopsy of the disc space.  From a posterior washout standpoint I estimate that we could get less than 1 to 5% of his infection through that corridor as a majority of his enhancement is in his chest.    Lovenia Kim, MD/MSCR Dept. of Neurosurgery

## 2021-10-11 NOTE — Progress Notes (Signed)
Nutrition Brief Note  Patient identified on the Malnutrition Screening Tool (MST) Report  Wt Readings from Last 15 Encounters:  10/10/21 79.4 kg  03/31/21 83.9 kg  01/23/21 83.9 kg  06/02/20 83.9 kg  11/01/12 83.9 kg   Pt with PMH back pain, IVDU last use 6 months prior to admission.  Presented complaining of pain in head, neck, upper back x1 week, no relief with chiropractor.  Reviewed I/O's: -400 ml x 24 hours  UOP: 400 ml x 24 hours  Pt admitted with discitis and osteomyelitis of cervicothoracic region with epidural phlegmon.   Spoke with pt at bedside, who complains of intermittent pain in back and shoulder blades. Pt reports poor oral intake for 3 days PTA secondary to pain, but this has improved. Noted pt consumed 100% of his lunch. Pt typically cosnumes 2-3 meals per day PTA, which consist of meat, starch, and vegetable, which he prepares himself. Pt tries to follow a healthful lifestyle by eating a lot of lean proteins, fruits, and vegetables. He admits to drinking soda and sweet tea.   Pt admits to "a few pounds" of weight loss. Pt reports UBW is around 180#.   Nutrition-Focused physical exam completed. Findings are no fat depletion, no muscle depletion, and no edema.    Discussed importance of good meal and supplement intake to promote healing. Pt with no questions or concerns, but expressed appreciation of visit.   Labs reviewed: K: 3.4.    Current diet order is regular, patient is consuming approximately 100% of meals at this time. Labs and medications reviewed.   No nutrition interventions warranted at this time. If nutrition issues arise, please consult RD.   Loistine Chance, RD, LDN, [place] Registered Dietitian II Certified Diabetes Care and Education Specialist Please refer to [facility] for RD and/or RD on-call/weekend/after hours pager

## 2021-10-11 NOTE — Plan of Care (Signed)
  Problem: Health Behavior/Discharge Planning: Goal: Ability to manage health-related needs will improve Outcome: Progressing   Problem: Clinical Measurements: Goal: Ability to maintain clinical measurements within normal limits will improve Outcome: Progressing   

## 2021-10-11 NOTE — Progress Notes (Signed)
Pharmacy Antibiotic Note  Cameron Hammond is a 46 y.o. male admitted on 10/10/2021 with sepsis.  Pharmacy has been consulted for Vancomycin dosing.  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 Vancomycin 1250 mg IV Q12H ordered to start on 5/26 @ 0400.  AUC = 460.9 Vanc trough = 11.9   F/u renal fxn    Height: 5\' 7"  (170.2 cm) Weight: 79.4 kg (175 lb) IBW/kg (Calculated) : 66.1  Temp (24hrs), Avg:98.3 F (36.8 C), Min:98.1 F (36.7 C), Max:98.6 F (37 C)  Recent Labs  Lab 10/10/21 1304 10/10/21 1529 10/11/21 0554  WBC 17.3*  --  15.4*  CREATININE 0.79  --   --   LATICACIDVEN  --  1.2  --      Estimated Creatinine Clearance: 117.8 mL/min (by C-G formula based on SCr of 0.79 mg/dL).    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  Cameron Hammond A 10/11/2021 2:39 PM

## 2021-10-11 NOTE — Consult Note (Signed)
NAME: Cameron Hammond  DOB: 11-Jun-1975  MRN: [id number]  Date/Time: 10/11/2021 11:26 AM  REQUESTING PROVIDER:Alexander Subjective:  REASON FOR CONSULT: MRSA bacteremia ? Cameron Hammond is a 46 y.o. male with a history of Renal stones, past MRSA soft tissue abscess presented to the ED on 10/10/21 with severe pain neck and upper back worsening over the past week. He did not sustain any trauma. No relief with chiropracter manipulation. He had no fever, He had some night sweats Pt states he has not done any IVDA in the past 2 years.( But the hospitalist note says 6 months)  . 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 No pertinent surgical history.  Social History   Socioeconomic History   Marital status: Divorced    Spouse name: Not on file   Number of children: Not on file   Years of education: Not on file   Highest education level: Not on file  Occupational History   Not on file  Tobacco Use   Smoking status: Every Day   Smokeless tobacco: Never  Substance and Sexual Activity   Alcohol use: Not Currently   Drug use: No   Sexual activity: Not on file  Other Topics Concern   Not on file  Social History Narrative   Not on file   Social Determinants of Health   Financial Resource Strain: Not on file  Food Insecurity: Not on file  Transportation Needs: Not on file  Physical Activity: Not on file  Stress: Not on file  Social Connections: Not on file  Intimate Partner  Violence: Not on file    No family history on file. No Known Allergies I? Current Facility-Administered Medications  Medication Dose Route Frequency Provider Last Rate Last Admin   acetaminophen (TYLENOL) tablet 650 mg  650 mg Oral Q6H PRN Andris Baumann, MD       Or   acetaminophen (TYLENOL) suppository 650 mg  650 mg Rectal Q6H PRN Andris Baumann, MD       cyclobenzaprine (FLEXERIL) tablet 5 mg  5 mg Oral TID PRN Andris Baumann, MD   5 mg at 10/11/21 0058   enoxaparin (LOVENOX) injection 40 mg  40 mg Subcutaneous Q24H Lindajo Royal V, MD   40 mg at 10/10/21 2333   HYDROmorphone (DILAUDID) injection 1 mg  1 mg Intravenous Q3H PRN Andris Baumann, MD   1 mg at 10/10/21 2332   ketorolac (TORADOL) 30 MG/ML injection 30 mg  30 mg Intravenous Q6H PRN Andris Baumann, MD   30 mg at 10/11/21 0934   ondansetron (ZOFRAN) tablet 4 mg  4 mg Oral Q6H PRN Andris Baumann, MD       Or   ondansetron [facility]) injection 4 mg  4 mg Intravenous Q6H PRN Andris Baumann, MD       orphenadrine (NORFLEX) injection 60 mg  60 mg Intravenous Q12H Triplett, Cari B,  FNP   60 mg at 10/11/21 0457   oxyCODONE (Oxy IR/ROXICODONE) immediate release tablet 5 mg  5 mg Oral Q4H PRN Andris Baumann, MD       oxyCODONE-acetaminophen (PERCOCET/ROXICET) 5-325 MG per tablet 1 tablet  1 tablet Oral Q4H PRN Chesley Noon, MD   1 tablet at 10/10/21 1933   vancomycin (VANCOREADY) IVPB 1250 mg/250 mL  1,250 mg Intravenous Q12H Andris Baumann, MD 166.7 mL/hr at 10/11/21 0341 1,250 mg at 10/11/21 0341     Abtx:  Anti-infectives (From admission, onward)    Start     Dose/Rate Route Frequency Ordered Stop   10/11/21 0400  vancomycin (VANCOREADY) IVPB 1250 mg/250 mL        1,250 mg 166.7 mL/hr over 90 Minutes Intravenous Every 12 hours 10/10/21 2300     10/10/21 2300  piperacillin-tazobactam (ZOSYN) IVPB 3.375 g  Status:  Discontinued        3.375 g 12.5 mL/hr over 240 Minutes Intravenous Every 8 hours 10/10/21 2258 10/11/21  0612   10/10/21 1530  vancomycin (VANCOREADY) IVPB 1500 mg/300 mL        1,500 mg 150 mL/hr over 120 Minutes Intravenous  Once 10/10/21 1517 10/10/21 1841   10/10/21 1530  piperacillin-tazobactam (ZOSYN) IVPB 3.375 g        3.375 g 100 mL/hr over 30 Minutes Intravenous  Once 10/10/21 1517 10/10/21 1603       REVIEW OF SYSTEMS:  Const: negative fever, negative chills, negative weight loss Eyes: negative diplopia or visual changes, negative eye pain ENT: negative coryza, negative sore throat Resp: negative cough, hemoptysis, dyspnea Cards: negative for chest pain, palpitations, lower extremity edema GU: negative for frequency, dysuria and hematuria, had a few days ago some dysuria which resolved completely after he peed a lot one night- He thinks he may have passed a stone GI: Negative for abdominal pain, diarrhea, bleeding, constipation Skin: negative for rash and pruritus Heme: negative for easy bruising and gum/nose bleeding MS: pain neck, upper back, shoulder blades Neurolo:negative for headaches, dizziness, vertigo, memory problems  Psych: negative for feelings of anxiety, depression  Endocrine: negative for thyroid, diabetes Allergy/Immunology- negative for any medication or food allergies ?  Objective:  VITALS:  BP 111/67 (BP Location: Right Arm)   Pulse 72   Temp 98.6 F (37 C)   Resp 20   Ht  (1.702 m)   Wt 79.4 kg   SpO2 100%   BMI 27.41 kg/m  LDA none PHYSICAL EXAM:  General: Alert, cooperative, no distress at rest, but movt of neck causes pain, appears young for  age.  Head: Normocephalic, without obvious abnormality, atraumatic. Eyes: Conjunctivae clear, anicteric sclerae. Pupils are equal ENT Nares normal. No drainage or sinus tenderness. Lips, mucosa, and tongue normal. No Thrush Neck: painful movt no carotid bruit and no JVD. Back: No CVA tenderness. Lungs: Clear to auscultation bilaterally. No Wheezing or Rhonchi. No rales. Heart: Regular rate  and rhythm, no murmur, rub or gallop. Abdomen: Soft, non-tender,not distended. Bowel sounds normal. No masses Extremities: atraumatic, no cyanosis. No edema. No clubbing Skin: No rashes or lesions. Or bruising Lymph: Cervical, supraclavicular normal. Neurologic: Grossly non-focal Pertinent Labs Lab Results CBC    Component Value Date/Time   WBC 15.4 (H) 10/11/2021 0554   RBC 4.33 10/11/2021 0554   HGB 13.1 10/11/2021 0554   HCT 38.8 (L) 10/11/2021 0554   PLT 283 10/11/2021 0554   MCV 89.6 10/11/2021 0554   MCH 30.3 10/11/2021  0554   MCHC 33.8 10/11/2021 0554   RDW 12.6 10/11/2021 0554   LYMPHSABS 0.8 10/10/2021 1304   MONOABS 1.5 (H) 10/10/2021 1304   EOSABS 0.0 10/10/2021 1304   BASOSABS 0.0 10/10/2021 1304       Latest Ref Rng & Units 10/10/2021    1:04 PM 11/19/2012    7:46 PM  CMP  Glucose 70 - 99 mg/dL 098133   119121    BUN 6 - 20 mg/dL 16   10    Creatinine 1.470.61 - 1.24 mg/dL 8.290.79   5.621.01    Sodium 130135 - 145 mmol/L 138   138    Potassium 3.5 - 5.1 mmol/L 3.4   3.2    Chloride 98 - 111 mmol/L 100   98    CO2 22 - 32 mmol/L 29   32    Calcium 8.9 - 10.3 mg/dL 9.1   8.9    Total Protein 6.0 - 8.3 g/dL  7.4    Total Bilirubin 0.3 - 1.2 mg/dL  0.5    Alkaline Phos 39 - 117 U/L  79    AST 0 - 37 U/L  20    ALT 0 - 53 U/L  20        Microbiology: Recent Results (from the past 240 hour(s))  Blood culture (routine x 2)     Status: None (Preliminary result)   Collection Time: 10/10/21  3:19 PM   Specimen: BLOOD LEFT FOREARM  Result Value Ref Range Status   Specimen Description BLOOD LEFT FOREARM  Final   Special Requests   Final    BOTTLES DRAWN AEROBIC AND ANAEROBIC Blood Culture adequate volume   Culture  Setup Time   Final    Organism ID to follow GRAM POSITIVE COCCI IN BOTH AEROBIC AND ANAEROBIC BOTTLES CRITICAL RESULT CALLED TO, READ BACK BY AND VERIFIED WITH: JASON ROBBINS AT [id number] 10/11/21.PMF Performed at [facility], [address] [place],  [place] [id number]    [facility]  Final   Report Status PENDING  Incomplete  Blood Culture ID Panel (Reflexed)     Status: Abnormal   Collection Time: 10/10/21  3:19 PM  Result Value Ref Range Status   Enterococcus faecalis NOT DETECTED NOT DETECTED Final   Enterococcus Faecium NOT DETECTED NOT DETECTED Final   Listeria monocytogenes NOT DETECTED NOT DETECTED Final   Staphylococcus species DETECTED (A) NOT DETECTED Final    Comment: CRITICAL RESULT CALLED TO, READ BACK BY AND VERIFIED WITH: JASON ROBBINS AT 0608 10/11/21.PMF    Staphylococcus aureus (BCID) DETECTED (A) NOT DETECTED Final    Comment: Methicillin (oxacillin)-resistant Staphylococcus aureus (MRSA). MRSA is predictably resistant to beta-lactam antibiotics (except ceftaroline). Preferred therapy is vancomycin unless clinically contraindicated. Patient requires contact precautions if  hospitalized. CRITICAL RESULT CALLED TO, READ BACK BY AND VERIFIED WITH: JASON ROBBINS AT [id number] 10/11/21.PMF    Staphylococcus epidermidis NOT DETECTED NOT DETECTED Final   Staphylococcus lugdunensis NOT DETECTED NOT DETECTED Final   Streptococcus species NOT DETECTED NOT DETECTED Final   Streptococcus agalactiae NOT DETECTED NOT DETECTED Final   Streptococcus pneumoniae NOT DETECTED NOT DETECTED Final   Streptococcus pyogenes NOT DETECTED NOT DETECTED Final   A.calcoaceticus-baumannii NOT DETECTED NOT DETECTED Final   Bacteroides fragilis NOT DETECTED NOT DETECTED Final   Enterobacterales NOT DETECTED NOT DETECTED Final   Enterobacter cloacae complex NOT DETECTED NOT DETECTED Final   Escherichia coli NOT DETECTED NOT DETECTED Final   Klebsiella aerogenes NOT DETECTED  NOT DETECTED Final   Klebsiella oxytoca NOT DETECTED NOT DETECTED Final   Klebsiella pneumoniae NOT DETECTED NOT DETECTED Final   Proteus species NOT DETECTED NOT DETECTED Final   Salmonella species NOT DETECTED NOT DETECTED Final   Serratia marcescens NOT DETECTED NOT  DETECTED Final   Haemophilus influenzae NOT DETECTED NOT DETECTED Final   Neisseria meningitidis NOT DETECTED NOT DETECTED Final   Pseudomonas aeruginosa NOT DETECTED NOT DETECTED Final   Stenotrophomonas maltophilia NOT DETECTED NOT DETECTED Final   Candida albicans NOT DETECTED NOT DETECTED Final   Candida auris NOT DETECTED NOT DETECTED Final   Candida glabrata NOT DETECTED NOT DETECTED Final   Candida krusei NOT DETECTED NOT DETECTED Final   Candida parapsilosis NOT DETECTED NOT DETECTED Final   Candida tropicalis NOT DETECTED NOT DETECTED Final   Cryptococcus neoformans/gattii NOT DETECTED NOT DETECTED Final   Meth resistant mecA/C and MREJ DETECTED (A) NOT DETECTED Final    Comment: CRITICAL RESULT CALLED TO, READ BACK BY AND VERIFIED WITH: JASON ROBBINS AT 0608 10/11/21.PMF Performed at [facility], [address] [place], [place] [address]   Blood culture (routine x 2)     Status: None (Preliminary result)   Collection Time: 10/10/21  3:24 PM   Specimen: Right Antecubital; Blood  Result Value Ref Range Status   Specimen Description RIGHT ANTECUBITAL  Final   Special Requests   Final    BOTTLES DRAWN AEROBIC AND ANAEROBIC Blood Culture adequate volume   Culture  Setup Time   Final    GRAM POSITIVE COCCI IN BOTH AEROBIC AND ANAEROBIC BOTTLES CRITICAL RESULT CALLED TO, READ BACK BY AND VERIFIED WITH: JASON ROBBINS AT 7829 10/11/21.PMF Performed at [facility], [address] [place], [place] [address]    Culture [facility] POSITIVE COCCI  Final   Report Status PENDING  Incomplete    IMAGING RESULTS:  I have personally reviewed the films ?T2-T3 discitis and osteo with paraspinal phlegmon extending to c6 till T6  Impression/Recommendation ? 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 Note:  This document was prepared using Dragon voice recognition software and may include unintentional dictation errors.

## 2021-10-11 NOTE — Progress Notes (Signed)
PHARMACY - PHYSICIAN COMMUNICATION CRITICAL VALUE ALERT - BLOOD CULTURE IDENTIFICATION (BCID)  Cameron Hammond is an 46 y.o. male who presented to [facility] on 10/10/2021 with a chief complaint of osteomyelitis.   Assessment:  MRSA in 3 of 4 bottles  (include suspected source if known)  Name of physician (or Provider) Contacted: Damita Dunnings  Current antibiotics: Vanc, Zosyn   Changes to prescribed antibiotics recommended:  Will d/c zosyn and continue with vanc   Results for orders placed or performed during the hospital encounter of 10/10/21  Blood Culture ID Panel (Reflexed) (Collected: 10/10/2021  3:19 PM)  Result Value Ref Range   Enterococcus faecalis NOT DETECTED NOT DETECTED   Enterococcus Faecium NOT DETECTED NOT DETECTED   Listeria monocytogenes NOT DETECTED NOT DETECTED   Staphylococcus species DETECTED (A) NOT DETECTED   Staphylococcus aureus (BCID) DETECTED (A) NOT DETECTED   Staphylococcus epidermidis NOT DETECTED NOT DETECTED   Staphylococcus lugdunensis NOT DETECTED NOT DETECTED   Streptococcus species NOT DETECTED NOT DETECTED   Streptococcus agalactiae NOT DETECTED NOT DETECTED   Streptococcus pneumoniae NOT DETECTED NOT DETECTED   Streptococcus pyogenes NOT DETECTED NOT DETECTED   A.calcoaceticus-baumannii NOT DETECTED NOT DETECTED   Bacteroides fragilis NOT DETECTED NOT DETECTED   Enterobacterales NOT DETECTED NOT DETECTED   Enterobacter cloacae complex NOT DETECTED NOT DETECTED   Escherichia coli NOT DETECTED NOT DETECTED   Klebsiella aerogenes NOT DETECTED NOT DETECTED   Klebsiella oxytoca NOT DETECTED NOT DETECTED   Klebsiella pneumoniae NOT DETECTED NOT DETECTED   Proteus species NOT DETECTED NOT DETECTED   Salmonella species NOT DETECTED NOT DETECTED   Serratia marcescens NOT DETECTED NOT DETECTED   Haemophilus influenzae NOT DETECTED NOT DETECTED   Neisseria meningitidis NOT DETECTED NOT DETECTED   Pseudomonas aeruginosa NOT DETECTED NOT DETECTED    Stenotrophomonas maltophilia NOT DETECTED NOT DETECTED   Candida albicans NOT DETECTED NOT DETECTED   Candida auris NOT DETECTED NOT DETECTED   Candida glabrata NOT DETECTED NOT DETECTED   Candida krusei NOT DETECTED NOT DETECTED   Candida parapsilosis NOT DETECTED NOT DETECTED   Candida tropicalis NOT DETECTED NOT DETECTED   Cryptococcus neoformans/gattii NOT DETECTED NOT DETECTED   Meth resistant mecA/C and MREJ DETECTED (A) NOT DETECTED    Tarnesha Ulloa D 10/11/2021  6:12 AM

## 2021-10-12 LAB — URINE DRUG SCREEN, QUALITATIVE (ARMC ONLY)
Amphetamines, Ur Screen: NOT DETECTED
Barbiturates, Ur Screen: NOT DETECTED
Benzodiazepine, Ur Scrn: NOT DETECTED
Cannabinoid 50 Ng, Ur ~~LOC~~: POSITIVE — AB
Cocaine Metabolite,Ur ~~LOC~~: NOT DETECTED
MDMA (Ecstasy)Ur Screen: NOT DETECTED
Methadone Scn, Ur: NOT DETECTED
Opiate, Ur Screen: POSITIVE — AB
Phencyclidine (PCP) Ur S: NOT DETECTED
Tricyclic, Ur Screen: POSITIVE — AB

## 2021-10-12 LAB — CBC
HCT: 36.1 % — ABNORMAL LOW (ref 39.0–52.0)
Hemoglobin: 12.3 g/dL — ABNORMAL LOW (ref 13.0–17.0)
MCH: 29.8 pg (ref 26.0–34.0)
MCHC: 34.1 g/dL (ref 30.0–36.0)
MCV: 87.4 fL (ref 80.0–100.0)
Platelets: 296 10*3/uL (ref 150–400)
RBC: 4.13 MIL/uL — ABNORMAL LOW (ref 4.22–5.81)
RDW: 12.7 % (ref 11.5–15.5)
WBC: 13 10*3/uL — ABNORMAL HIGH (ref 4.0–10.5)
nRBC: 0 % (ref 0.0–0.2)

## 2021-10-12 LAB — BASIC METABOLIC PANEL
Anion gap: 11 (ref 5–15)
BUN: 16 mg/dL (ref 6–20)
CO2: 26 mmol/L (ref 22–32)
Calcium: 8.4 mg/dL — ABNORMAL LOW (ref 8.9–10.3)
Chloride: 98 mmol/L (ref 98–111)
Creatinine, Ser: 0.68 mg/dL (ref 0.61–1.24)
GFR, Estimated: >60 mL/min (ref >60)
Glucose, Bld: 110 mg/dL — ABNORMAL HIGH (ref 70–99)
Potassium: 3.2 mmol/L — ABNORMAL LOW (ref 3.5–5.1)
Sodium: 135 mmol/L (ref 135–145)

## 2021-10-12 MED ORDER — POTASSIUM CHLORIDE 10 MEQ/100ML IV SOLN
10.0000 meq | INTRAVENOUS | Status: AC
  Administered 2021-10-12 (×3): 10 meq via INTRAVENOUS
  Filled 2021-10-12 (×3): qty 100

## 2021-10-12 NOTE — Progress Notes (Signed)
1900 -During shift change patient yelling and screaming in his room so loud that he could be heard at the nurses station. Upon entering the patient room, patient was on the floor on his knees laying on bed with upper torso. Patient complained that he was in severe pain. Dilaudid was last given at 1800. MD was called and with order for 0.5 mg Dilaudid and through out the rest of the shift patient was given scheduled Norco and PRN Dilaudid as often as ordered. This a.m. patient was requesting to take a shower even able to remove bedside commode in shower with out assistance. 0630 Patient is resting in bed.

## 2021-10-12 NOTE — Progress Notes (Signed)
PROGRESS NOTE    Cameron Hammond  ZOX:[id number] DOB: November 08, 1975  DOA: 10/10/2021 Date of Service: 10/12/21 PCP: Patient, No Pcp Per (Inactive)     Brief Narrative / Hospital Course:  Cameron Hammond is a 46 year old male PMH back pain, IVDU last use 6 months prior to admission.  Presented to ED 10/10/21 complaining of pain in head, neck, upper back x1 week, no relief with chiropractor. 05/25: In ED CT head/cervical spine and CXR negative, CTA chest no PE, paraspinal soft tissue thickening extending from T2-T5 concerning for osteomyelitis.  WBC 17.3.  Started on vancomycin, Zosyn.  Vital signs initially normal, later transient tachycardia/tachypnea, presumably due to pain, requiring IV Dilaudid for pain control.   MRI thoracic and cervical spine: Discitis/osteomyelitis at 2 to T3, extensive adjacent anterior and lateral paraspinal phlegmon extending to C6 and to T6, dorsal and ventral phlegmon T2-T4 ventrally and T1-T5 dorsally, probable septic arthritis of the right C2-C3 facet joint with adjacent facet osteomyelitis and right paraspinal soft tissue inflammation, right sided ventral and lateral phlegmon C2-C3.   MRSA on blood culture and 3 of 4 bottles, Zosyn was d/c, vancomycin was continued. 05/26: WBC trending down to 15.4. ID advising neurosurgery consult to eval for washout/procedure to reduce bioburden. TEE may not be needed since will not change therapy of at least 6 weeks abx IV. Repeat BCx in 48 hours to eval for clearance. Neurosurgery reviewed and communicated w/ me via secure chat, no plan at this point for surgical intervention.  05/27: pt reporting pain, see nurse notes from overnight. Otherwise remains of IV abx, ID to peripherally follow through the weekend (today is Saturday, Monday is Memorial Day). UTox still pending.   Consultants:  Infectious disease Neurosurgery   Procedures: Transthoracic Echo 10/12/21     Subjective: Patient reports pain is not well controlled, esp neck  pain. No N/V, no CP/SOB. Denies IVDU in the past 2 years (inconsistent w/ HPI in H&P).      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  ID consult: Continue abx for now, repeat BCx. Patient will require long-term antibiotic treatment 6 weeks, however w/ history of IVDU would be reluctant to discharge with venous access, appreciate recs regarding outpatient abx plan on eventual discharge.  Neurosurgical consult: MRI showing no abscess, and no neurologic deficits to warrant emergent intervention, will continue neurologic checks q shift and prn, neurosurgery advises no intervention / surgical procedure at this time since unlikely to drain anything, if needed could consider IR to sample  Pain control with toradol and oxycodone scheduled, will work on tapering down/off opiates, dilaudid IV for breakthrough but caution w/ this given history   Septic arthritis of cervical spine (HCC) Management as above  History of intravenous drug abuse (HCC) States last use was six months ago Counseled on abstinence  Hypokalemia Oral repletion and monitor    DVT prophylaxis: lovenox Code Status: FULL Family Communication: none at this time Disposition Plan /  TOC needs: remains inpatient for now for IV abx and close monitoring neurological status, no TOC needs at this time Barriers to discharge / significant  pending items: see above             Objective: Vitals:   10/11/21 2022 10/12/21 0335 10/12/21 0503 10/12/21 0756  BP: 139/72 123/86 125/71 (!) 111/54  Pulse: 89 93 86 71  Resp: 20 18 20    Temp: 99.6 F (37.6 C) (!) 100.9 F (38.3 C) 99.7 F (37.6 C) 98.2 F (36.8 C)  TempSrc:      SpO2: 100% 100% 96% 98%  Weight:      Height:        Intake/Output Summary (Last 24 hours) at 10/12/2021 1346 Last data filed at 10/12/2021 1223 Gross per 24 hour  Intake 490 ml  Output 300 ml  Net 190 ml   Filed Weights   10/10/21 1152  Weight: 79.4 kg    Examination:  Constitutional:  VS as above General Appearance: alert, well-developed, well-nourished, NAD Eyes: Normal lids and conjunctive, non-icteric sclera Ears, Nose, Mouth, Throat: Normal appearance Neck: No masses, trachea midline Respiratory: Normal respiratory effort Cardiovascular: S1/S2 normal, RRR No lower extremity edema Musculoskeletal:  No clubbing/cyanosis of digits Neurological: No cranial nerve deficit on limited exam Motor and sensation intact and symmetric Psychiatric: Normal judgment/insight Normal mood and affect       Scheduled Medications:   enoxaparin (LOVENOX) injection  40 mg Subcutaneous Q24H   HYDROcodone-acetaminophen  1 tablet Oral Q6H   ketorolac  30 mg Intravenous Q8H   orphenadrine  60 mg Intravenous Q12H    Continuous Infusions:  vancomycin Stopped (10/12/21 0519)    PRN Medications:  acetaminophen **OR** acetaminophen, cyclobenzaprine, HYDROmorphone (DILAUDID) injection, ondansetron **OR** ondansetron (ZOFRAN) IV  Antimicrobials:  Anti-infectives (From admission, onward)    Start     Dose/Rate Route Frequency Ordered Stop   10/11/21 0400  vancomycin (VANCOREADY) IVPB 1250 mg/250 mL        1,250 mg 166.7 mL/hr over 90 Minutes Intravenous Every 12 hours 10/10/21 2300     10/10/21 2300  piperacillin-tazobactam (ZOSYN) IVPB 3.375 g  Status:  Discontinued         3.375 g 12.5 mL/hr over 240 Minutes Intravenous Every 8 hours 10/10/21 2258 10/11/21 0612   10/10/21 1530  vancomycin (VANCOREADY) IVPB 1500 mg/300 mL        1,500 mg 150 mL/hr over 120 Minutes Intravenous  Once 10/10/21 1517 10/10/21 1841   10/10/21 1530  piperacillin-tazobactam (ZOSYN) IVPB 3.375 g        3.375 g 100 mL/hr over 30 Minutes Intravenous  Once 10/10/21 1517 10/10/21 1603       Data Reviewed: I have personally reviewed following labs and imaging studies  CBC: Recent Labs  Lab 10/10/21 1304 10/11/21 0554 10/12/21 0423  WBC 17.3* 15.4* 13.0*  NEUTROABS 14.8*  --   --   HGB 13.7 13.1 12.3*  HCT 41.1 38.8* 36.1*  MCV 88.8 89.6 87.4  PLT 287 283 296   Basic Metabolic Panel: Recent Labs  Lab 10/10/21 1304 10/12/21 0423  NA 138 135  K 3.4* 3.2*  CL 100 98  CO2 29 26  GLUCOSE 133* 110*  BUN 16 16  CREATININE 0.79 0.68  CALCIUM 9.1 8.4*   GFR: Estimated Creatinine Clearance: 117.8 mL/min (by C-G formula based on SCr of 0.68 mg/dL). Liver Function Tests: No results for input(s): AST, ALT, ALKPHOS, BILITOT, PROT, ALBUMIN in the last 168 hours.  No results for input(s): LIPASE, AMYLASE in the last 168 hours. No results for input(s): AMMONIA in the last 168 hours. Coagulation Profile: No results for input(s): INR, PROTIME in the last 168 hours. Cardiac Enzymes: No results for input(s): CKTOTAL, CKMB, CKMBINDEX, TROPONINI in the last 168 hours. BNP (last 3 results) No results for input(s): PROBNP in the last 8760 hours. HbA1C: No results for input(s): HGBA1C in the last 72 hours. CBG: No results for input(s): GLUCAP in the last 168 hours. Lipid Profile: No results for input(s): CHOL, HDL, LDLCALC, TRIG, CHOLHDL, LDLDIRECT in the last 72 hours. Thyroid Function Tests: No results for input(s): TSH, T4TOTAL, FREET4, T3FREE, THYROIDAB in the last 72 hours. Anemia Panel: No results for input(s): VITAMINB12, FOLATE, FERRITIN, TIBC, IRON, RETICCTPCT in the last  72 hours. Urine analysis:    Component Value Date/Time   COLORURINE AMBER (A) 11/19/2012 1957   APPEARANCEUR CLEAR 11/19/2012 1957   LABSPEC 1.027 11/19/2012 1957   PHURINE 6.5 11/19/2012 1957   GLUCOSEU NEGATIVE 11/19/2012 1957   HGBUR NEGATIVE 11/19/2012 1957   BILIRUBINUR SMALL (A) 11/19/2012 1957   KETONESUR 15 (A) 11/19/2012 1957   PROTEINUR 30 (A) 11/19/2012 1957   UROBILINOGEN 1.0 11/19/2012 1957   NITRITE NEGATIVE 11/19/2012 1957   LEUKOCYTESUR NEGATIVE 11/19/2012 1957   Sepsis Labs: @LABRCNTIP (procalcitonin:4,lacticidven:4)  Recent Results (from the past 240 hour(s))  Blood culture (routine x 2)     Status: Abnormal (Preliminary result)   Collection Time: 10/10/21  3:19 PM   Specimen: BLOOD LEFT FOREARM  Result Value Ref Range Status   Specimen Description   Final    BLOOD LEFT FOREARM Performed at [facility], [address], [place], [place] [address]    Special Requests   Final    BOTTLES DRAWN AEROBIC AND ANAEROBIC Blood Culture adequate volume Performed at [facility], [address] [place], [place] [address]    Culture  Setup Time   Final    Organism ID to follow GRAM POSITIVE COCCI IN BOTH AEROBIC AND ANAEROBIC BOTTLES CRITICAL RESULT CALLED TO, READ BACK BY AND VERIFIED WITH: JASON ROBBINS AT 8657 10/11/21.PMF Performed at [facility], [address], [place], [place] [address]    Culture (A)  Final    STAPHYLOCOCCUS AUREUS SUSCEPTIBILITIES TO FOLLOW Performed at [facility], [address], [place], [place] [address]    Report Status PENDING  Incomplete  Blood Culture ID Panel (Reflexed)     Status: Abnormal   Collection Time: 10/10/21  3:19 PM  Result Value Ref Range Status   Enterococcus faecalis NOT DETECTED NOT DETECTED Final   Enterococcus Faecium NOT DETECTED NOT DETECTED Final   Listeria monocytogenes NOT DETECTED NOT DETECTED Final   Staphylococcus species DETECTED (A) NOT DETECTED Final     Comment: CRITICAL RESULT CALLED TO, READ BACK BY AND VERIFIED WITH: JASON ROBBINS AT 0608 10/11/21.PMF    Staphylococcus aureus (BCID) DETECTED (A) NOT DETECTED Final    Comment: Methicillin (oxacillin)-resistant Staphylococcus aureus (MRSA). MRSA is predictably resistant to beta-lactam antibiotics (except ceftaroline). Preferred therapy is vancomycin unless clinically contraindicated. Patient requires contact precautions if  hospitalized. CRITICAL RESULT CALLED TO, READ BACK BY AND VERIFIED WITH: JASON ROBBINS AT 4132 10/11/21.PMF    Staphylococcus epidermidis NOT DETECTED NOT DETECTED Final   Staphylococcus lugdunensis NOT DETECTED NOT DETECTED Final   Streptococcus species NOT DETECTED NOT DETECTED Final   Streptococcus agalactiae NOT DETECTED NOT DETECTED Final   Streptococcus pneumoniae NOT DETECTED NOT DETECTED Final  Streptococcus pyogenes NOT DETECTED NOT DETECTED Final   A.calcoaceticus-baumannii NOT DETECTED NOT DETECTED Final   Bacteroides fragilis NOT DETECTED NOT DETECTED Final   Enterobacterales NOT DETECTED NOT DETECTED Final   Enterobacter cloacae complex NOT DETECTED NOT DETECTED Final   Escherichia coli NOT DETECTED NOT DETECTED Final   Klebsiella aerogenes NOT DETECTED NOT DETECTED Final   Klebsiella oxytoca NOT DETECTED NOT DETECTED Final   Klebsiella pneumoniae NOT DETECTED NOT DETECTED Final   Proteus species NOT DETECTED NOT DETECTED Final   Salmonella species NOT DETECTED NOT DETECTED Final   Serratia marcescens NOT DETECTED NOT DETECTED Final   Haemophilus influenzae NOT DETECTED NOT DETECTED Final   Neisseria meningitidis NOT DETECTED NOT DETECTED Final   Pseudomonas aeruginosa NOT DETECTED NOT DETECTED Final   Stenotrophomonas maltophilia NOT DETECTED NOT DETECTED Final   Candida albicans NOT DETECTED NOT DETECTED Final   Candida auris NOT DETECTED NOT DETECTED Final   Candida glabrata NOT DETECTED NOT DETECTED Final   Candida krusei NOT DETECTED NOT  DETECTED Final   Candida parapsilosis NOT DETECTED NOT DETECTED Final   Candida tropicalis NOT DETECTED NOT DETECTED Final   Cryptococcus neoformans/gattii NOT DETECTED NOT DETECTED Final   Meth resistant mecA/C and MREJ DETECTED (A) NOT DETECTED Final    Comment: CRITICAL RESULT CALLED TO, READ BACK BY AND VERIFIED WITH: JASON ROBBINS AT 0608 10/11/21.PMF Performed at [facility], [address] [place], [place] [address]   Blood culture (routine x 2)     Status: Abnormal (Preliminary result)   Collection Time: 10/10/21  3:24 PM   Specimen: Right Antecubital; Blood  Result Value Ref Range Status   Specimen Description   Final    RIGHT ANTECUBITAL Performed at [facility], [address] [place], [place] [address]    Special Requests   Final    BOTTLES DRAWN AEROBIC AND ANAEROBIC Blood Culture adequate volume Performed at [facility], [address] [place], [place] [address]    Culture  Setup Time   Final    GRAM POSITIVE COCCI IN BOTH AEROBIC AND ANAEROBIC BOTTLES CRITICAL RESULT CALLED TO, READ BACK BY AND VERIFIED WITH: JASON ROBBINS AT 8295 10/11/21.PMF Performed at [facility], [address] [place], [place] [address]    Culture STAPHYLOCOCCUS AUREUS (A)  Final   Report Status PENDING  Incomplete         Radiology Studies last 96 hours: DG Chest 2 View  Result Date: 10/10/2021 CLINICAL DATA:  Chest pain. EXAM: CHEST - 2 VIEW COMPARISON:  06/02/2020 and prior studies FINDINGS: The cardiomediastinal silhouette is unremarkable. There is no evidence of focal airspace disease, pulmonary edema, suspicious pulmonary nodule/mass, pleural effusion, or pneumothorax. No acute bony abnormalities are identified. IMPRESSION: No active cardiopulmonary disease. Electronically Signed   By: Harmon Pier M.D.   On: 10/10/2021 13:37   CT Head Wo Contrast  Result Date: 10/10/2021 CLINICAL DATA:  Headache, new or worsening headache in  46 year old male. EXAM: CT HEAD WITHOUT CONTRAST CT CERVICAL SPINE WITHOUT CONTRAST TECHNIQUE: Multidetector CT imaging of the head and cervical spine was performed following the standard protocol without intravenous contrast. Multiplanar CT image reconstructions of the cervical spine were also generated. RADIATION DOSE REDUCTION: This exam was performed according to the departmental dose-optimization program which includes automated exposure control, adjustment of the mA and/or kV according to patient size and/or use of iterative reconstruction technique. COMPARISON:  July 4th of 2014 CT of the face. FINDINGS: CT HEAD FINDINGS Brain:  No evidence of acute infarction, hemorrhage, hydrocephalus, extra-axial collection or mass lesion/mass effect. Vascular: No hyperdense vessel or unexpected calcification. Skull: Normal. Negative for fracture or focal lesion. Sinuses/Orbits: Visualized paranasal sinuses and orbits are unremarkable to the extent evaluated. Other: None CT CERVICAL SPINE FINDINGS Alignment: Mild anterolisthesis of C2 on C3 approximately 2 mm, associated with facet arthropathy that is greatest on the RIGHT. Skull base and vertebrae: No acute fracture. No primary bone lesion or focal pathologic process. Soft tissues and spinal canal: No prevertebral fluid or swelling. No visible canal hematoma. Disc levels: Multilevel degenerative changes in the cervical spine. Facet arthropathy greatest at C2-3 as discussed. Multiple levels of facet arthropathy and multiple levels of disc space narrowing. Disc space narrowing and osteophyte formation greatest in mid cervical spine and lower cervical spine at C5-6 and C6-7. Upper chest: Negative. Other: None IMPRESSION: 1. No acute intracranial abnormality. 2. No acute fracture or traumatic subluxation of the cervical spine. 3. Multilevel degenerative changes in the cervical spine. Electronically Signed   By: Donzetta Kohut M.D.   On: 10/10/2021 13:37   CT Angio Chest PE W  and/or Wo Contrast  Result Date: 10/10/2021 CLINICAL DATA:  Upper back pain EXAM: CT ANGIOGRAPHY CHEST WITH CONTRAST TECHNIQUE: Multidetector CT imaging of the chest was performed using the standard protocol during bolus administration of intravenous contrast. Multiplanar CT image reconstructions and MIPs were obtained to evaluate the vascular anatomy. RADIATION DOSE REDUCTION: This exam was performed according to the departmental dose-optimization program which includes automated exposure control, adjustment of the mA and/or kV according to patient size and/or use of iterative reconstruction technique. CONTRAST:  75mL OMNIPAQUE IOHEXOL 350 MG/ML SOLN COMPARISON:  None Available. FINDINGS: Cardiovascular: Normal heart size. No pericardial effusion. Normal caliber thoracic aorta with no significant atherosclerotic disease. Adequate contrast opacification of the pulmonary arteries. No evidence of pulmonary embolus, although evaluation of the lower lobe segmental and subsegmental pulmonary arteries is limited due to respiratory motion artifact. Mediastinum/Nodes: Esophagus and thyroid are unremarkable. No pathologically enlarged lymph nodes seen in the chest. Lungs/Pleura: Central airways are patent. No consolidation, pleural effusion or pneumothorax. Mild bibasilar atelectasis. Upper Abdomen: No acute abnormality. Musculoskeletal: Paraspinal soft tissue thickening extending from T2-T5. Schmorl's node involving the inferior endplate of T2. No aggressive appearing osseous lesions. Review of the MIP images confirms the above findings. IMPRESSION: 1. No evidence of pulmonary embolus. 2. Paraspinal soft tissue thickening extending from T2-T5, concerning for osteomyelitis. Recommend contrast-enhanced MRI of the thoracic spine for further evaluation. Electronically Signed   By: Allegra Lai M.D.   On: 10/10/2021 14:36   CT Cervical Spine Wo Contrast  Result Date: 10/10/2021 CLINICAL DATA:  Headache, new or  worsening headache in 46 year old male. EXAM: CT HEAD WITHOUT CONTRAST CT CERVICAL SPINE WITHOUT CONTRAST TECHNIQUE: Multidetector CT imaging of the head and cervical spine was performed following the standard protocol without intravenous contrast. Multiplanar CT image reconstructions of the cervical spine were also generated. RADIATION DOSE REDUCTION: This exam was performed according to the departmental dose-optimization program which includes automated exposure control, adjustment of the mA and/or kV according to patient size and/or use of iterative reconstruction technique. COMPARISON:  July 4th of 2014 CT of the face. FINDINGS: CT HEAD FINDINGS Brain: No evidence of acute infarction, hemorrhage, hydrocephalus, extra-axial collection or mass lesion/mass effect. Vascular: No hyperdense vessel or unexpected calcification. Skull: Normal. Negative for fracture or focal lesion. Sinuses/Orbits: Visualized paranasal sinuses and orbits are unremarkable to the extent evaluated. Other: None CT CERVICAL  SPINE FINDINGS Alignment: Mild anterolisthesis of C2 on C3 approximately 2 mm, associated with facet arthropathy that is greatest on the RIGHT. Skull base and vertebrae: No acute fracture. No primary bone lesion or focal pathologic process. Soft tissues and spinal canal: No prevertebral fluid or swelling. No visible canal hematoma. Disc levels: Multilevel degenerative changes in the cervical spine. Facet arthropathy greatest at C2-3 as discussed. Multiple levels of facet arthropathy and multiple levels of disc space narrowing. Disc space narrowing and osteophyte formation greatest in mid cervical spine and lower cervical spine at C5-6 and C6-7. Upper chest: Negative. Other: None IMPRESSION: 1. No acute intracranial abnormality. 2. No acute fracture or traumatic subluxation of the cervical spine. 3. Multilevel degenerative changes in the cervical spine. Electronically Signed   By: Donzetta Kohut M.D.   On: 10/10/2021 13:37    MR Cervical Spine W or Wo Contrast  Result Date: 10/10/2021 CLINICAL DATA:  Myelopathy, acute, cervical spine; Myelopathy, acute, thoracic spine EXAM: MRI CERVICAL AND THORACIC SPINE WITHOUT AND WITH CONTRAST TECHNIQUE: Multiplanar and multiecho pulse sequences of the cervical spine, to include the craniocervical junction and cervicothoracic junction, and the thoracic spine, were obtained without and with intravenous contrast. CONTRAST:  7.13mL GADAVIST GADOBUTROL 1 MMOL/ML IV SOLN COMPARISON:  Same day CT chest FINDINGS: MRI CERVICAL SPINE FINDINGS Motion degraded exam. Alignment: Trace anterolisthesis at C2-C3. Vertebrae: No evidence of acute fracture or aggressive osseous lesion. Cord: Normal signal and morphology. Posterior Fossa, vertebral arteries, paraspinal tissues: Preserved vertebral artery flow voids. The posterior fossa is unremarkable. There is soft tissue edema and enhancement along the right posterior paraspinal soft tissues adjacent to the right C2-C3 facet. There is paraspinal soft tissue edema along the upper thoracic spine extending into the anterior lower aspect of the cervical spine to level of C6, described below. Disc levels: The craniocervical junction is unremarkable. C2-C3: Trace degenerative anterolisthesis. Advanced right-sided facet arthropathy with cortical irregularity, periarticular marrow edema and enhancement, and a right-sided facet effusion. Mild bilateral neural foraminal stenosis.Minimal spinal canal narrowing. Right sided ventral and lateral epidural phlegmon measuring up to 2 mm in thicknes C3-C4: Minimal disc bulging. Minimal spinal canal narrowing. Mild bilateral facet arthropathy.Moderate left neural foraminal stenosis. No right neural foraminal stenosis. C4-C5: Asymmetric right disc bulging. Mild bilateral facet arthropathy. Minimal canal narrowing. Mild right neural foraminal stenosis. No left neural foraminal stenosis. C5-C6: Posterior disc osteophyte complex and  uncovertebral joint hypertrophy. Mild spinal canal stenosis. Moderate bilateral neural foraminal stenosis, left greater than right. C6-C7: Posterior disc osteophyte complex. Minimal canal narrowing. Mild bilateral neural foraminal stenosis. C7-T1: No significant spinal canal or neural foraminal narrowing. MRI THORACIC SPINE FINDINGS Alignment:  Physiologic. Vertebrae: There is endplate irregularity, marrow edema and enhancement at T2-T3 with minimally increased abnormal signal in the intervertebral disc. Focal T1 and T2 hyperintense lesion within the posterior aspect of the T10 vertebral body on the left near the pedicle with central signal dropout on STIR likely an atypical hemangioma. Scattered additional degenerative endplate changes. Cord: No definite abnormal cord signal. Paraspinal and other soft tissues: There is extensive anterior and lateral paraspinal soft tissue edema and enhancement spanning T2 through T6 consistent with phlegmon. There is thin phlegmonous extension into the anterior paraspinal soft tissues of the lower cervical spine to the level of C6 (cervical spine MRI sagittal postcontrast image 3). Disc levels: There is disc bulging at T2-T3, asymmetric right resulting in moderate to severe right-sided neural foraminal stenosis. There is ventral and dorsal epidural phlegmon spanning from T2-T4  ventrally and T1-T5 dorsally, measuring up to 2-3 mm and thickness. There is multilevel disc desiccation throughout the thoracic spine, with additional mild disc bulging at T3-T4 resulting in mild bilateral neural foraminal stenosis and minimal disc bulging at T10-T11 and T11-T12 resulting in no significant stenosis. 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 There is also adjacent dorsal and ventral epidural phlegmon measuring up to 2-3 mm in  thickness, spanning from T2-T4 ventrally and T1-T5 dorsally. 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 Right sided ventral and lateral epidural phlegmon at C2-C3 measuring up to 2 mm in thickness. Degenerative changes in the cervical and thoracic spine as described above. Electronically Signed   By: Caprice Renshaw M.D.   On: 10/10/2021 22:02   MR THORACIC SPINE W WO CONTRAST  Result Date: 10/10/2021 CLINICAL DATA:  Myelopathy, acute, cervical spine; Myelopathy, acute, thoracic spine EXAM: MRI CERVICAL AND THORACIC SPINE WITHOUT AND WITH CONTRAST TECHNIQUE: Multiplanar and multiecho pulse sequences of the cervical spine, to include the craniocervical junction and cervicothoracic junction, and the thoracic spine, were obtained without and with intravenous contrast. CONTRAST:  7.74mL GADAVIST GADOBUTROL 1 MMOL/ML IV SOLN COMPARISON:  Same day CT chest FINDINGS: MRI CERVICAL SPINE FINDINGS Motion degraded exam. Alignment: Trace anterolisthesis at C2-C3. Vertebrae: No evidence of acute fracture or aggressive osseous lesion. Cord: Normal signal and morphology. Posterior Fossa, vertebral arteries, paraspinal tissues: Preserved vertebral artery flow voids. The posterior fossa is unremarkable. There is soft tissue edema and enhancement along the right posterior paraspinal soft tissues adjacent to the right C2-C3 facet. There is paraspinal soft tissue edema along the upper thoracic spine extending into the anterior lower aspect of the cervical spine to level of C6, described below. Disc levels: The craniocervical junction is unremarkable. C2-C3: Trace degenerative anterolisthesis. Advanced right-sided facet arthropathy with cortical irregularity, periarticular marrow edema and enhancement, and a right-sided facet effusion. Mild bilateral neural foraminal stenosis.Minimal spinal canal narrowing.  Right sided ventral and lateral epidural phlegmon measuring up to 2 mm in thicknes C3-C4: Minimal disc bulging. Minimal spinal canal narrowing. Mild bilateral facet arthropathy.Moderate left neural foraminal stenosis. No right neural foraminal stenosis. C4-C5: Asymmetric right disc bulging. Mild bilateral facet arthropathy. Minimal canal narrowing. Mild right neural foraminal stenosis. No left neural foraminal stenosis. C5-C6: Posterior disc osteophyte complex and uncovertebral joint hypertrophy. Mild spinal canal stenosis. Moderate bilateral neural foraminal stenosis, left greater than right. C6-C7: Posterior disc osteophyte complex. Minimal canal narrowing. Mild bilateral neural foraminal stenosis. C7-T1: No significant spinal canal or neural foraminal narrowing. MRI THORACIC SPINE FINDINGS Alignment:  Physiologic. Vertebrae: There is endplate irregularity, marrow edema and enhancement at T2-T3 with minimally increased abnormal signal in the intervertebral disc. Focal T1 and T2 hyperintense lesion within the posterior aspect of the T10 vertebral body on the left near the pedicle with central signal dropout on STIR likely an atypical hemangioma. Scattered additional degenerative endplate changes. Cord: No definite abnormal cord signal. Paraspinal and other soft tissues: There is extensive anterior and lateral paraspinal soft tissue edema and enhancement spanning T2 through T6 consistent with phlegmon. There is thin phlegmonous extension into the anterior paraspinal soft tissues of the lower cervical spine to the level of C6 (cervical spine MRI sagittal postcontrast image 3). Disc levels: There is  disc bulging at T2-T3, asymmetric right resulting in moderate to severe right-sided neural foraminal stenosis. There is ventral and dorsal epidural phlegmon spanning from T2-T4 ventrally and T1-T5 dorsally, measuring up to 2-3 mm and thickness. There is multilevel disc desiccation throughout the thoracic spine, with  additional mild disc bulging at T3-T4 resulting in mild bilateral neural foraminal stenosis and minimal disc bulging at T10-T11 and T11-T12 resulting in no significant stenosis. 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 There is also adjacent dorsal and ventral epidural phlegmon measuring up to 2-3 mm in thickness, spanning from T2-T4 ventrally and T1-T5 dorsally. 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 Right sided ventral and lateral epidural phlegmon at C2-C3 measuring up to 2 mm in thickness. Degenerative changes in the cervical and thoracic spine as described above. Electronically Signed   By: Caprice Renshaw M.D.   On: 10/10/2021 22:02   ECHOCARDIOGRAM COMPLETE  Result Date: 10/11/2021    ECHOCARDIOGRAM REPORT   Patient Name:   Cameron Hammond Date of Exam: 10/11/2021 Medical Rec #:  [id number]   Height:       67.0 in Accession #:    [id number]  Weight:       175.0 lb Date of Birth:  January 14, 1976    BSA:          1.911 m Patient Age:    46 years    BP:           111/67 mmHg Patient Gender: M           HR:           72 bpm. Exam Location:  [facility] Procedure: 2D Echo, Cardiac Doppler and Color Doppler Indications:     Bacteremia R78.81  History:         Patient has no prior history of Echocardiogram examinations.                  Hypokalemia , History of IV drug abuse.  Sonographer:     Cristela Blue Referring Phys:  [id number] Sunnie Nielsen Diagnosing Phys: Julien Nordmann MD  Sonographer Comments: Suboptimal apical window. IMPRESSIONS  1. Left ventricular ejection fraction, by estimation, is 60 to 65%. The left ventricle has normal function. The left ventricle has no regional wall motion abnormalities. Left ventricular  diastolic parameters were normal.  2. Right ventricular systolic function is normal. The right ventricular size is normal. There is normal pulmonary artery systolic pressure. The estimated right ventricular systolic pressure is 17.8 mmHg.  3. The mitral valve is normal in structure. Mild mitral valve regurgitation. No evidence of mitral stenosis.  4. The aortic valve is normal in structure. Aortic valve regurgitation is not visualized. Aortic valve sclerosis is present, with no evidence of aortic valve stenosis.  5. The inferior vena cava is normal in size with greater than 50% respiratory variability, suggesting right atrial pressure of 3 mmHg.  6. No valve vegetation noted. FINDINGS  Left Ventricle: Left ventricular ejection fraction, by estimation, is 60 to 65%. The left ventricle has normal function. The left ventricle has no regional wall motion abnormalities. The left ventricular internal cavity size was normal in size. There is  no left ventricular hypertrophy. Left ventricular diastolic parameters were normal. Right Ventricle: The  right ventricular size is normal. No increase in right ventricular wall thickness. Right ventricular systolic function is normal. There is normal pulmonary artery systolic pressure. The tricuspid regurgitant velocity is 1.79 m/s, and  with an assumed right atrial pressure of 5 mmHg, the estimated right ventricular systolic pressure is 17.8 mmHg. Left Atrium: Left atrial size was normal in size. Right Atrium: Right atrial size was normal in size. Pericardium: There is no evidence of pericardial effusion. Mitral Valve: The mitral valve is normal in structure. Mild mitral valve regurgitation. No evidence of mitral valve stenosis. MV peak gradient, 3.6 mmHg. The mean mitral valve gradient is 2.0 mmHg. Tricuspid Valve: The tricuspid valve is normal in structure. Tricuspid valve regurgitation is mild . No evidence of tricuspid stenosis. Aortic Valve: The aortic valve is normal in  structure. Aortic valve regurgitation is not visualized. Aortic valve sclerosis is present, with no evidence of aortic valve stenosis. Aortic valve mean gradient measures 3.0 mmHg. Aortic valve peak gradient measures 6.0 mmHg. Aortic valve area, by VTI measures 2.26 cm. Pulmonic Valve: The pulmonic valve was normal in structure. Pulmonic valve regurgitation is not visualized. No evidence of pulmonic stenosis. Aorta: The aortic root is normal in size and structure. Venous: The inferior vena cava is normal in size with greater than 50% respiratory variability, suggesting right atrial pressure of 3 mmHg. IAS/Shunts: No atrial level shunt detected by color flow Doppler.  LEFT VENTRICLE PLAX 2D LVIDd:         4.70 cm   Diastology LVIDs:         2.80 cm   LV e' medial:    9.25 cm/s LV PW:         0.90 cm   LV E/e' medial:  8.0 LV IVS:        0.70 cm   LV e' lateral:   16.50 cm/s LVOT diam:     2.00 cm   LV E/e' lateral: 4.5 LV SV:         42 LV SV Index:   22 LVOT Area:     3.14 cm  RIGHT VENTRICLE RV Basal diam:  3.90 cm RV S prime:     12.90 cm/s TAPSE (M-mode): 2.3 cm LEFT ATRIUM             Index        RIGHT ATRIUM           Index LA diam:        3.50 cm 1.83 cm/m   RA Area:     16.60 cm LA Vol (A2C):   24.4 ml 12.77 ml/m  RA Volume:   45.30 ml  23.71 ml/m LA Vol (A4C):   36.7 ml 19.21 ml/m LA Biplane Vol: 30.0 ml 15.70 ml/m  AORTIC VALVE                    PULMONIC VALVE AV Area (Vmax):    2.30 cm     PV Vmax:        1.02 m/s AV Area (Vmean):   2.10 cm     PV Vmean:       68.800 cm/s AV Area (VTI):     2.26 cm     PV VTI:         0.175 m AV Vmax:           122.00 cm/s  PV Peak grad:   4.2 mmHg AV Vmean:  82.100 cm/s  PV Mean grad:   2.0 mmHg AV VTI:            0.186 m      RVOT Peak grad: 5 mmHg AV Peak Grad:      6.0 mmHg AV Mean Grad:      3.0 mmHg LVOT Vmax:         89.40 cm/s LVOT Vmean:        54.800 cm/s LVOT VTI:          0.134 m LVOT/AV VTI ratio: 0.72  AORTA Ao Root diam: 3.60 cm MITRAL  VALVE               TRICUSPID VALVE MV Area (PHT): 4.41 cm    TR Peak grad:   12.8 mmHg MV Area VTI:   1.97 cm    TR Vmax:        179.00 cm/s MV Peak grad:  3.6 mmHg MV Mean grad:  2.0 mmHg    SHUNTS MV Vmax:       0.95 m/s    Systemic VTI:  0.13 m MV Vmean:      64.5 cm/s   Systemic Diam: 2.00 cm MV Decel Time: 172 msec    Pulmonic VTI:  0.147 m MV E velocity: 74.10 cm/s MV A velocity: 56.60 cm/s MV E/A ratio:  1.31 Julien Nordmann MD Electronically signed by Julien Nordmann MD Signature Date/Time: 10/11/2021/2:55:05 PM    Final             LOS: 2 days    Time spent: 35 min    Sunnie Nielsen, DO [facility] 10/12/2021, 1:46 PM   Staff may message me via secure chat in Epic  but this may not receive immediate response,  please page for urgent matters!  If 7PM-7AM, please contact night-coverage [email or web address]  Dictation software was used to generate the above note. Typos may occur and escape review, as with typed/written notes. Please contact Dr Lyn Hollingshead directly for clarity if needed.

## 2021-10-13 LAB — BLOOD CULTURE ID PANEL (REFLEXED) - BCID2

## 2021-10-13 LAB — CULTURE, BLOOD (ROUTINE X 2)
Special Requests: ADEQUATE
Special Requests: ADEQUATE

## 2021-10-13 LAB — VANCOMYCIN, PEAK: Vancomycin Pk: 28 ug/mL — ABNORMAL LOW (ref 30–40)

## 2021-10-13 MED ORDER — HYDROMORPHONE HCL 1 MG/ML IJ SOLN
0.5000 mg | INTRAMUSCULAR | Status: DC | PRN
  Administered 2021-10-13 – 2021-10-14 (×4): 0.5 mg via INTRAVENOUS
  Filled 2021-10-13 (×4): qty 1

## 2021-10-13 MED ORDER — HYDROCODONE-ACETAMINOPHEN 10-325 MG PO TABS
1.0000 | ORAL_TABLET | Freq: Four times a day (QID) | ORAL | Status: AC
  Administered 2021-10-13 – 2021-10-16 (×12): 1 via ORAL
  Filled 2021-10-13 (×13): qty 1

## 2021-10-13 MED ORDER — KETOROLAC TROMETHAMINE 30 MG/ML IJ SOLN
30.0000 mg | Freq: Three times a day (TID) | INTRAMUSCULAR | Status: DC | PRN
  Administered 2021-10-13 – 2021-10-15 (×6): 30 mg via INTRAVENOUS
  Filled 2021-10-13 (×6): qty 1

## 2021-10-13 NOTE — Progress Notes (Addendum)
PHARMACY - PHYSICIAN COMMUNICATION CRITICAL VALUE ALERT - BLOOD CULTURE IDENTIFICATION (BCID)  Cameron Hammond is an 46 y.o. male who presented to [facility] on 10/10/2021 with a chief complaint of Osteomyelitis  Assessment:  MRSA in 1 of 4 bottles (include suspected source if known)  Name of physician (or Provider) Contacted: Bishop Limbo, NP   Current antibiotics: Vancomycin   Changes to prescribed antibiotics recommended:  Patient is on recommended antibiotics - No changes needed NP will defer any changes to attending MD.   Results for orders placed or performed during the hospital encounter of 10/10/21  Blood Culture ID Panel (Reflexed) (Collected: 10/13/2021  4:32 AM)  Result Value Ref Range   Enterococcus faecalis NOT DETECTED NOT DETECTED   Enterococcus Faecium NOT DETECTED NOT DETECTED   Listeria monocytogenes NOT DETECTED NOT DETECTED   Staphylococcus species DETECTED (A) NOT DETECTED   Staphylococcus aureus (BCID) DETECTED (A) NOT DETECTED   Staphylococcus epidermidis NOT DETECTED NOT DETECTED   Staphylococcus lugdunensis NOT DETECTED NOT DETECTED   Streptococcus species NOT DETECTED NOT DETECTED   Streptococcus agalactiae NOT DETECTED NOT DETECTED   Streptococcus pneumoniae NOT DETECTED NOT DETECTED   Streptococcus pyogenes NOT DETECTED NOT DETECTED   A.calcoaceticus-baumannii NOT DETECTED NOT DETECTED   Bacteroides fragilis NOT DETECTED NOT DETECTED   Enterobacterales NOT DETECTED NOT DETECTED   Enterobacter cloacae complex NOT DETECTED NOT DETECTED   Escherichia coli NOT DETECTED NOT DETECTED   Klebsiella aerogenes NOT DETECTED NOT DETECTED   Klebsiella oxytoca NOT DETECTED NOT DETECTED   Klebsiella pneumoniae NOT DETECTED NOT DETECTED   Proteus species NOT DETECTED NOT DETECTED   Salmonella species NOT DETECTED NOT DETECTED   Serratia marcescens NOT DETECTED NOT DETECTED   Haemophilus influenzae NOT DETECTED NOT DETECTED   Neisseria meningitidis NOT DETECTED NOT  DETECTED   Pseudomonas aeruginosa NOT DETECTED NOT DETECTED   Stenotrophomonas maltophilia NOT DETECTED NOT DETECTED   Candida albicans NOT DETECTED NOT DETECTED   Candida auris NOT DETECTED NOT DETECTED   Candida glabrata NOT DETECTED NOT DETECTED   Candida krusei NOT DETECTED NOT DETECTED   Candida parapsilosis NOT DETECTED NOT DETECTED   Candida tropicalis NOT DETECTED NOT DETECTED   Cryptococcus neoformans/gattii NOT DETECTED NOT DETECTED   Meth resistant mecA/C and MREJ DETECTED (A) NOT DETECTED    Amariya Liskey D 10/13/2021  10:51 PM

## 2021-10-13 NOTE — Progress Notes (Signed)
PROGRESS NOTE    Cameron Hammond  WHQ:[id number] DOB: [birth year]  DOA: 10/10/2021 Date of Service: 10/13/21 PCP: Patient, No Pcp Per (Inactive)     Brief Narrative / Hospital Course:  Cameron Hammond is a 45 year old male PMH back pain, IVDU last use 6 months prior to admission (varying history on this).  Presented to ED 10/10/21 complaining of pain in head, neck, upper back x1 week, no relief with chiropractor. 05/25: In ED CT head/cervical spine and CXR negative, CTA chest no PE, paraspinal soft tissue thickening extending from T2-T5 concerning for osteomyelitis.  WBC 17.3.  Started on vancomycin, Zosyn.  Vital signs initially normal, later transient tachycardia/tachypnea, presumably due to pain, requiring IV Dilaudid for pain control.   MRI thoracic and cervical spine: Discitis/osteomyelitis at 2 to T3, extensive adjacent anterior and lateral paraspinal phlegmon extending to C6 and to T6, dorsal and ventral phlegmon T2-T4 ventrally and T1-T5 dorsally, probable septic arthritis of the right C2-C3 facet joint with adjacent facet osteomyelitis and right paraspinal soft tissue inflammation, right sided ventral and lateral phlegmon C2-C3.   MRSA on blood culture 3 of 4 bottles, Zosyn was d/c, vancomycin was continued. 05/26: WBC trending down to 15.4. ID advising neurosurgery consult to eval for washout/procedure to reduce bioburden. TEE may not be needed since will not change therapy of at least 6 weeks abx IV. Repeat BCx in 48 hours to eval for clearance. Neurosurgery reviewed and communicated w/ me via secure chat, no plan at this point for surgical intervention.  05/27: pt reporting pain, see nurse notes from overnight. Otherwise remains of IV abx, ID to peripherally follow through the weekend (today is Saturday, Monday is Memorial Day). UTox (+)opiates (from inpatient administration meds), cannabinoids, tricyclics.  05/28: pain better on scheduled Norco 10 q6h, repeat cultures 48 h per ID recs (drawn  yesterday) no growth so far   Consultants:  Infectious disease Neurosurgery   Procedures: Transthoracic Echo     Subjective: Patient reports pain is better controlled today, he's more ambulatory.      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  ID consult: Continue abx for now, repeat BCx pending results. Patient will require long-term antibiotic treatment 6 weeks, however w/ history of IVDU would be reluctant to discharge with venous access, appreciate recs regarding outpatient abx plan on eventual discharge.  Neurosurgical consult: MRI showing no abscess, and no neurologic deficits to warrant emergent intervention, will continue neurologic checks q shift and prn, neurosurgery advises no intervention / surgical procedure at this time since unlikely to drain anything, if needed could consider IR to sample  Pain control with toradol and oxycodone scheduled, will work on tapering down/off opiates, dilaudid IV for breakthrough but have instructed RN to avoid giving this if possible   Septic arthritis of cervical spine (HCC) Management as above  History of intravenous drug abuse (HCC) States last use was six months ago Counseled on abstinence  Hypokalemia Oral repletion and  monitor    DVT prophylaxis: lovenox Code Status: FULL Family Communication: none at this time Disposition Plan / TOC  needs: remains inpatient for now for IV abx and close monitoring neurological status, no TOC needs at this time. Hoping to keep here through the weekend, confirm negative repeat blood cultures or further treatment if positive, improve pain control, await ID recs re: outpatient abx plan,  Barriers to discharge / significant pending items: see above             Objective: Vitals:   10/12/21 1944 10/12/21 2000 10/13/21 0315 10/13/21 0742  BP: 132/70 132/70 112/67 112/65  Pulse: (!) 101 86 71 65  Resp: Temp: (!) 102.5 F (39.2 C) 98.9 F (37.2 C) 99.4 F (37.4 C) 99.4 F (37.4 C)  TempSrc: Oral Oral    SpO2: 97% 97% 99% 99%  Weight:      Height:        Intake/Output Summary (Last 24 hours) at 10/13/2021 1218 Last data filed at 10/13/2021 1191 Gross per 24 hour  Intake 250 ml  Output 1100 ml  Net -850 ml   Filed Weights   10/10/21 1152  Weight: 79.4 kg    Examination:  Constitutional:  VS as above General Appearance: alert, well-developed, well-nourished, NAD Eyes: Normal lids and conjunctive, non-icteric sclera Ears, Nose, Mouth, Throat: Normal appearance Neck: No masses, trachea midline Respiratory: Normal respiratory effort Cardiovascular: S1/S2 normal, RRR No lower extremity edema Musculoskeletal:  No clubbing/cyanosis of digits Neurological: No cranial nerve deficit on limited exam Motor and sensation intact and symmetric Psychiatric: Normal judgment/insight Normal mood and affect       Scheduled Medications:   enoxaparin (LOVENOX) injection  40 mg Subcutaneous Q24H   HYDROcodone-acetaminophen  1 tablet Oral Q6H   ketorolac  30 mg Intravenous Q8H   orphenadrine  60 mg Intravenous Q12H    Continuous Infusions:  vancomycin 1,250 mg (10/13/21 0433)    PRN Medications:  acetaminophen **OR** acetaminophen, cyclobenzaprine, HYDROmorphone (DILAUDID) injection, ketorolac, ondansetron **OR** ondansetron (ZOFRAN)  IV  Antimicrobials:  Anti-infectives (From admission, onward)    Start     Dose/Rate Route Frequency Ordered Stop   10/11/21 0400  vancomycin (VANCOREADY) IVPB 1250 mg/250 mL        1,250 mg 166.7 mL/hr over 90 Minutes Intravenous Every 12 hours 10/10/21 2300     10/10/21 2300  piperacillin-tazobactam (ZOSYN) IVPB 3.375 g  Status:  Discontinued        3.375 g 12.5 mL/hr over 240 Minutes Intravenous Every 8 hours 10/10/21 2258 10/11/21 0612   10/10/21 1530  vancomycin (VANCOREADY) IVPB 1500 mg/300 mL        1,500 mg 150 mL/hr over 120 Minutes Intravenous  Once 10/10/21 1517 10/10/21 1841   10/10/21 1530  piperacillin-tazobactam (ZOSYN) IVPB 3.375 g        3.375 g 100 mL/hr over 30 Minutes Intravenous  Once 10/10/21 1517 10/10/21 1603       Data Reviewed: I have personally reviewed following labs and imaging studies  CBC: Recent Labs  Lab 10/10/21 1304 10/11/21 0554 10/12/21 0423  WBC 17.3* 15.4* 13.0*  NEUTROABS 14.8*  --   --   HGB 13.7 13.1 12.3*  HCT 41.1 38.8* 36.1*  MCV 88.8 89.6 87.4  PLT 287 283 296   Basic Metabolic Panel: Recent Labs  Lab 10/10/21 1304 10/12/21 0423  NA 138 135  K 3.4* 3.2*  CL 100 98  CO2 29 26  GLUCOSE  133* 110*  BUN 16 16  CREATININE 0.79 0.68  CALCIUM 9.1 8.4*   GFR: Estimated Creatinine Clearance: 117.8 mL/min (by C-G formula based on SCr of 0.68 mg/dL). Liver Function Tests: No results for input(s): AST, ALT, ALKPHOS, BILITOT, PROT, ALBUMIN in the last 168 hours. No results for input(s): LIPASE, AMYLASE in the last 168 hours. No results for input(s): AMMONIA in the last 168 hours. Coagulation Profile: No results for input(s): INR, PROTIME in the last 168 hours. Cardiac Enzymes: No results for input(s): CKTOTAL, CKMB, CKMBINDEX, TROPONINI in the last 168 hours. BNP (last 3 results) No results for input(s): PROBNP in the last 8760 hours. HbA1C: No results for input(s): HGBA1C in the last 72 hours. CBG: No results for  input(s): GLUCAP in the last 168 hours. Lipid Profile: No results for input(s): CHOL, HDL, LDLCALC, TRIG, CHOLHDL, LDLDIRECT in the last 72 hours. Thyroid Function Tests: No results for input(s): TSH, T4TOTAL, FREET4, T3FREE, THYROIDAB in the last 72 hours. Anemia Panel: No results for input(s): VITAMINB12, FOLATE, FERRITIN, TIBC, IRON, RETICCTPCT in the last 72 hours. Urine analysis:    Component Value Date/Time   COLORURINE AMBER (A) 11/19/2012 1957   APPEARANCEUR CLEAR 11/19/2012 1957   LABSPEC 1.027 11/19/2012 1957   PHURINE 6.5 11/19/2012 1957   GLUCOSEU NEGATIVE 11/19/2012 1957   HGBUR NEGATIVE 11/19/2012 1957   BILIRUBINUR SMALL (A) 11/19/2012 1957   KETONESUR 15 (A) 11/19/2012 1957   PROTEINUR 30 (A) 11/19/2012 1957   UROBILINOGEN 1.0 11/19/2012 1957   NITRITE NEGATIVE 11/19/2012 1957   LEUKOCYTESUR NEGATIVE 11/19/2012 1957   Sepsis Labs: @LABRCNTIP (procalcitonin:4,lacticidven:4)  Recent Results (from the past 240 hour(s))  Blood culture (routine x 2)     Status: Abnormal   Collection Time: 10/10/21  3:19 PM   Specimen: BLOOD LEFT FOREARM  Result Value Ref Range Status   Specimen Description   Final    BLOOD LEFT FOREARM Performed at [facility], [address] [place], [place] [address]    Special Requests   Final    BOTTLES DRAWN AEROBIC AND ANAEROBIC Blood Culture adequate volume Performed at [facility], [address] [place], [place] [address]    Culture  Setup Time   Final    Organism ID to follow GRAM POSITIVE COCCI IN BOTH AEROBIC AND ANAEROBIC BOTTLES CRITICAL RESULT CALLED TO, READ BACK BY AND VERIFIED WITH: JASON ROBBINS AT 0981 10/11/21.PMF Performed at [facility], [address] [place], [place] [address]    Culture METHICILLIN RESISTANT STAPHYLOCOCCUS AUREUS (A)  Final   Report Status 10/13/2021 FINAL  Final   Organism ID, Bacteria METHICILLIN RESISTANT STAPHYLOCOCCUS AUREUS  Final      Susceptibility    Methicillin resistant staphylococcus aureus - MIC*    CIPROFLOXACIN <=0.5 SENSITIVE Sensitive     ERYTHROMYCIN >=8 RESISTANT Resistant     GENTAMICIN <=0.5 SENSITIVE Sensitive     OXACILLIN >=4 RESISTANT Resistant     TETRACYCLINE <=1 SENSITIVE Sensitive     VANCOMYCIN <=0.5 SENSITIVE Sensitive     TRIMETH/SULFA <=10 SENSITIVE Sensitive     CLINDAMYCIN <=0.25 SENSITIVE Sensitive     RIFAMPIN <=0.5 SENSITIVE Sensitive     Inducible Clindamycin NEGATIVE Sensitive     * METHICILLIN RESISTANT STAPHYLOCOCCUS AUREUS  Blood Culture ID Panel (Reflexed)     Status: Abnormal   Collection Time: 10/10/21  3:19 PM  Result Value Ref Range Status   Enterococcus faecalis NOT DETECTED NOT DETECTED Final   Enterococcus Faecium NOT DETECTED NOT  DETECTED Final   Listeria monocytogenes NOT DETECTED NOT DETECTED Final   Staphylococcus species DETECTED (A) NOT DETECTED Final    Comment: CRITICAL RESULT CALLED TO, READ BACK BY AND VERIFIED WITH: JASON ROBBINS AT 0608 10/11/21.PMF    Staphylococcus aureus (BCID) DETECTED (A) NOT DETECTED Final    Comment: Methicillin (oxacillin)-resistant Staphylococcus aureus (MRSA). MRSA is predictably resistant to beta-lactam antibiotics (except ceftaroline). Preferred therapy is vancomycin unless clinically contraindicated. Patient requires contact precautions if  hospitalized. CRITICAL RESULT CALLED TO, READ BACK BY AND VERIFIED WITH: JASON ROBBINS AT 1610 10/11/21.PMF    Staphylococcus epidermidis NOT DETECTED NOT DETECTED Final   Staphylococcus lugdunensis NOT DETECTED NOT DETECTED Final   Streptococcus species NOT DETECTED NOT DETECTED Final   Streptococcus agalactiae NOT DETECTED NOT DETECTED Final   Streptococcus pneumoniae NOT DETECTED NOT DETECTED Final   Streptococcus pyogenes NOT DETECTED NOT DETECTED Final   A.calcoaceticus-baumannii NOT DETECTED NOT DETECTED Final   Bacteroides fragilis NOT DETECTED NOT DETECTED Final   Enterobacterales NOT DETECTED NOT  DETECTED Final   Enterobacter cloacae complex NOT DETECTED NOT DETECTED Final   Escherichia coli NOT DETECTED NOT DETECTED Final   Klebsiella aerogenes NOT DETECTED NOT DETECTED Final   Klebsiella oxytoca NOT DETECTED NOT DETECTED Final   Klebsiella pneumoniae NOT DETECTED NOT DETECTED Final   Proteus species NOT DETECTED NOT DETECTED Final   Salmonella species NOT DETECTED NOT DETECTED Final   Serratia marcescens NOT DETECTED NOT DETECTED Final   Haemophilus influenzae NOT DETECTED NOT DETECTED Final   Neisseria meningitidis NOT DETECTED NOT DETECTED Final   Pseudomonas aeruginosa NOT DETECTED NOT DETECTED Final   Stenotrophomonas maltophilia NOT DETECTED NOT DETECTED Final   Candida albicans NOT DETECTED NOT DETECTED Final   Candida auris NOT DETECTED NOT DETECTED Final   Candida glabrata NOT DETECTED NOT DETECTED Final   Candida krusei NOT DETECTED NOT DETECTED Final   Candida parapsilosis NOT DETECTED NOT DETECTED Final   Candida tropicalis NOT DETECTED NOT DETECTED Final   Cryptococcus neoformans/gattii NOT DETECTED NOT DETECTED Final   Meth resistant mecA/C and MREJ DETECTED (A) NOT DETECTED Final    Comment: CRITICAL RESULT CALLED TO, READ BACK BY AND VERIFIED WITH: JASON ROBBINS AT 0608 10/11/21.PMF Performed at [facility], [address] [place], [place] [address]   Blood culture (routine x 2)     Status: Abnormal   Collection Time: 10/10/21  3:24 PM   Specimen: Right Antecubital; Blood  Result Value Ref Range Status   Specimen Description   Final    RIGHT ANTECUBITAL Performed at [facility] [place], [address], [place], [place] [address]    Special Requests   Final    BOTTLES DRAWN AEROBIC AND ANAEROBIC Blood Culture adequate volume Performed at [facility], [address] [place], [place] [address]    Culture  Setup Time   Final    GRAM POSITIVE COCCI IN BOTH AEROBIC AND ANAEROBIC BOTTLES CRITICAL RESULT CALLED TO, READ BACK BY AND  VERIFIED WITH: JASON ROBBINS AT 8295 10/11/21.PMF Performed at [facility], [address] [place], [place] [address]    Culture (A)  Final    STAPHYLOCOCCUS AUREUS SUSCEPTIBILITIES PERFORMED ON PREVIOUS CULTURE WITHIN THE LAST 5 DAYS. Performed at [facility], [address] [place], [place] [address]    Report Status 10/13/2021 FINAL  Final  Culture, blood (Routine X 2) w Reflex to ID Panel     Status: None (Preliminary result)   Collection Time: 10/13/21  4:31 AM   Specimen: BLOOD  Result Value Ref Range Status   Specimen Description BLOOD LEFT ANTECUBITAL  Final   Special Requests   Final    BOTTLES DRAWN AEROBIC AND ANAEROBIC Blood Culture adequate volume   Culture   Final    NO GROWTH <12 HOURS Performed at [facility], [address], [place], [place] [address]    Report Status PENDING  Incomplete  Culture, blood (Routine X 2) w Reflex to ID Panel     Status: None (Preliminary result)   Collection Time: 10/13/21  4:32 AM   Specimen: BLOOD  Result Value Ref Range Status   Specimen Description BLOOD RIGHT ANTECUBITAL  Final   Special Requests   Final    BOTTLES DRAWN AEROBIC AND ANAEROBIC Blood Culture adequate volume   Culture   Final    NO GROWTH <12 HOURS Performed at [facility], [address], [place], [place] [address]    Report Status PENDING  Incomplete         Radiology Studies last 96 hours: DG Chest 2 View  Result Date: 10/10/2021 CLINICAL DATA:  Chest pain. EXAM: CHEST - 2 VIEW COMPARISON:  06/02/2020 and prior studies FINDINGS: The cardiomediastinal silhouette is unremarkable. There is no evidence of focal airspace disease, pulmonary edema, suspicious pulmonary nodule/mass, pleural effusion, or pneumothorax. No acute bony abnormalities are identified. IMPRESSION: No active cardiopulmonary disease. Electronically Signed   By: Harmon Pier M.D.   On: 10/10/2021 13:37   CT Head Wo Contrast  Result Date:  10/10/2021 CLINICAL DATA:  Headache, new or worsening headache in 46 year old male. EXAM: CT HEAD WITHOUT CONTRAST CT CERVICAL SPINE WITHOUT CONTRAST TECHNIQUE: Multidetector CT imaging of the head and cervical spine was performed following the standard protocol without intravenous contrast. Multiplanar CT image reconstructions of the cervical spine were also generated. RADIATION DOSE REDUCTION: This exam was performed according to the departmental dose-optimization program which includes automated exposure control, adjustment of the mA and/or kV according to patient size and/or use of iterative reconstruction technique. COMPARISON:  July 4th of 2014 CT of the face. FINDINGS: CT HEAD FINDINGS Brain: No evidence of acute infarction, hemorrhage, hydrocephalus, extra-axial collection or mass lesion/mass effect. Vascular: No hyperdense vessel or unexpected calcification. Skull: Normal. Negative for fracture or focal lesion. Sinuses/Orbits: Visualized paranasal sinuses and orbits are unremarkable to the extent evaluated. Other: None CT CERVICAL SPINE FINDINGS Alignment: Mild anterolisthesis of C2 on C3 approximately 2 mm, associated with facet arthropathy that is greatest on the RIGHT. Skull base and vertebrae: No acute fracture. No primary bone lesion or focal pathologic process. Soft tissues and spinal canal: No prevertebral fluid or swelling. No visible canal hematoma. Disc levels: Multilevel degenerative changes in the cervical spine. Facet arthropathy greatest at C2-3 as discussed. Multiple levels of facet arthropathy and multiple levels of disc space narrowing. Disc space narrowing and osteophyte formation greatest in mid cervical spine and lower cervical spine at C5-6 and C6-7. Upper chest: Negative. Other: None IMPRESSION: 1. No acute intracranial abnormality. 2. No acute fracture or traumatic subluxation of the cervical spine. 3. Multilevel degenerative changes in the cervical spine. Electronically Signed   By:  Donzetta Kohut M.D.   On: 10/10/2021 13:37   CT Angio Chest PE W and/or Wo Contrast  Result Date: 10/10/2021 CLINICAL DATA:  Upper back pain EXAM: CT ANGIOGRAPHY CHEST WITH CONTRAST TECHNIQUE: Multidetector CT imaging of the chest was performed using the standard protocol during bolus administration of intravenous contrast. Multiplanar  CT image reconstructions and MIPs were obtained to evaluate the vascular anatomy. RADIATION DOSE REDUCTION: This exam was performed according to the departmental dose-optimization program which includes automated exposure control, adjustment of the mA and/or kV according to patient size and/or use of iterative reconstruction technique. CONTRAST:  56mL OMNIPAQUE IOHEXOL 350 MG/ML SOLN COMPARISON:  None Available. FINDINGS: Cardiovascular: Normal heart size. No pericardial effusion. Normal caliber thoracic aorta with no significant atherosclerotic disease. Adequate contrast opacification of the pulmonary arteries. No evidence of pulmonary embolus, although evaluation of the lower lobe segmental and subsegmental pulmonary arteries is limited due to respiratory motion artifact. Mediastinum/Nodes: Esophagus and thyroid are unremarkable. No pathologically enlarged lymph nodes seen in the chest. Lungs/Pleura: Central airways are patent. No consolidation, pleural effusion or pneumothorax. Mild bibasilar atelectasis. Upper Abdomen: No acute abnormality. Musculoskeletal: Paraspinal soft tissue thickening extending from T2-T5. Schmorl's node involving the inferior endplate of T2. No aggressive appearing osseous lesions. Review of the MIP images confirms the above findings. IMPRESSION: 1. No evidence of pulmonary embolus. 2. Paraspinal soft tissue thickening extending from T2-T5, concerning for osteomyelitis. Recommend contrast-enhanced MRI of the thoracic spine for further evaluation. Electronically Signed   By: Allegra Lai M.D.   On: 10/10/2021 14:36   CT Cervical Spine Wo  Contrast  Result Date: 10/10/2021 CLINICAL DATA:  Headache, new or worsening headache in 46 year old male. EXAM: CT HEAD WITHOUT CONTRAST CT CERVICAL SPINE WITHOUT CONTRAST TECHNIQUE: Multidetector CT imaging of the head and cervical spine was performed following the standard protocol without intravenous contrast. Multiplanar CT image reconstructions of the cervical spine were also generated. RADIATION DOSE REDUCTION: This exam was performed according to the departmental dose-optimization program which includes automated exposure control, adjustment of the mA and/or kV according to patient size and/or use of iterative reconstruction technique. COMPARISON:  July 4th of 2014 CT of the face. FINDINGS: CT HEAD FINDINGS Brain: No evidence of acute infarction, hemorrhage, hydrocephalus, extra-axial collection or mass lesion/mass effect. Vascular: No hyperdense vessel or unexpected calcification. Skull: Normal. Negative for fracture or focal lesion. Sinuses/Orbits: Visualized paranasal sinuses and orbits are unremarkable to the extent evaluated. Other: None CT CERVICAL SPINE FINDINGS Alignment: Mild anterolisthesis of C2 on C3 approximately 2 mm, associated with facet arthropathy that is greatest on the RIGHT. Skull base and vertebrae: No acute fracture. No primary bone lesion or focal pathologic process. Soft tissues and spinal canal: No prevertebral fluid or swelling. No visible canal hematoma. Disc levels: Multilevel degenerative changes in the cervical spine. Facet arthropathy greatest at C2-3 as discussed. Multiple levels of facet arthropathy and multiple levels of disc space narrowing. Disc space narrowing and osteophyte formation greatest in mid cervical spine and lower cervical spine at C5-6 and C6-7. Upper chest: Negative. Other: None IMPRESSION: 1. No acute intracranial abnormality. 2. No acute fracture or traumatic subluxation of the cervical spine. 3. Multilevel degenerative changes in the cervical spine.  Electronically Signed   By: Donzetta Kohut M.D.   On: 10/10/2021 13:37   MR Cervical Spine W or Wo Contrast  Result Date: 10/10/2021 CLINICAL DATA:  Myelopathy, acute, cervical spine; Myelopathy, acute, thoracic spine EXAM: MRI CERVICAL AND THORACIC SPINE WITHOUT AND WITH CONTRAST TECHNIQUE: Multiplanar and multiecho pulse sequences of the cervical spine, to include the craniocervical junction and cervicothoracic junction, and the thoracic spine, were obtained without and with intravenous contrast. CONTRAST:  7.30mL GADAVIST GADOBUTROL 1 MMOL/ML IV SOLN COMPARISON:  Same day CT chest FINDINGS: MRI CERVICAL SPINE FINDINGS Motion degraded exam. Alignment: Trace anterolisthesis at  C2-C3. Vertebrae: No evidence of acute fracture or aggressive osseous lesion. Cord: Normal signal and morphology. Posterior Fossa, vertebral arteries, paraspinal tissues: Preserved vertebral artery flow voids. The posterior fossa is unremarkable. There is soft tissue edema and enhancement along the right posterior paraspinal soft tissues adjacent to the right C2-C3 facet. There is paraspinal soft tissue edema along the upper thoracic spine extending into the anterior lower aspect of the cervical spine to level of C6, described below. Disc levels: The craniocervical junction is unremarkable. C2-C3: Trace degenerative anterolisthesis. Advanced right-sided facet arthropathy with cortical irregularity, periarticular marrow edema and enhancement, and a right-sided facet effusion. Mild bilateral neural foraminal stenosis.Minimal spinal canal narrowing. Right sided ventral and lateral epidural phlegmon measuring up to 2 mm in thicknes C3-C4: Minimal disc bulging. Minimal spinal canal narrowing. Mild bilateral facet arthropathy.Moderate left neural foraminal stenosis. No right neural foraminal stenosis. C4-C5: Asymmetric right disc bulging. Mild bilateral facet arthropathy. Minimal canal narrowing. Mild right neural foraminal stenosis. No left  neural foraminal stenosis. C5-C6: Posterior disc osteophyte complex and uncovertebral joint hypertrophy. Mild spinal canal stenosis. Moderate bilateral neural foraminal stenosis, left greater than right. C6-C7: Posterior disc osteophyte complex. Minimal canal narrowing. Mild bilateral neural foraminal stenosis. C7-T1: No significant spinal canal or neural foraminal narrowing. MRI THORACIC SPINE FINDINGS Alignment:  Physiologic. Vertebrae: There is endplate irregularity, marrow edema and enhancement at T2-T3 with minimally increased abnormal signal in the intervertebral disc. Focal T1 and T2 hyperintense lesion within the posterior aspect of the T10 vertebral body on the left near the pedicle with central signal dropout on STIR likely an atypical hemangioma. Scattered additional degenerative endplate changes. Cord: No definite abnormal cord signal. Paraspinal and other soft tissues: There is extensive anterior and lateral paraspinal soft tissue edema and enhancement spanning T2 through T6 consistent with phlegmon. There is thin phlegmonous extension into the anterior paraspinal soft tissues of the lower cervical spine to the level of C6 (cervical spine MRI sagittal postcontrast image 3). Disc levels: There is disc bulging at T2-T3, asymmetric right resulting in moderate to severe right-sided neural foraminal stenosis. There is ventral and dorsal epidural phlegmon spanning from T2-T4 ventrally and T1-T5 dorsally, measuring up to 2-3 mm and thickness. There is multilevel disc desiccation throughout the thoracic spine, with additional mild disc bulging at T3-T4 resulting in mild bilateral neural foraminal stenosis and minimal disc bulging at T10-T11 and T11-T12 resulting in no significant stenosis. 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 There is also  adjacent dorsal and ventral epidural phlegmon measuring up to 2-3 mm in thickness, spanning from T2-T4 ventrally and T1-T5 dorsally. 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 Right sided ventral and lateral epidural phlegmon at C2-C3 measuring up to 2 mm in thickness. Degenerative changes in the cervical and thoracic spine as described above. Electronically Signed   By: Caprice Renshaw M.D.   On: 10/10/2021 22:02   MR THORACIC SPINE W WO CONTRAST  Result Date: 10/10/2021 CLINICAL DATA:  Myelopathy, acute, cervical spine; Myelopathy, acute, thoracic spine EXAM: MRI CERVICAL AND THORACIC SPINE WITHOUT AND WITH CONTRAST TECHNIQUE: Multiplanar and multiecho pulse sequences of the cervical spine, to include the craniocervical junction and cervicothoracic junction, and the thoracic spine, were obtained without and with intravenous contrast. CONTRAST:  7.79mL GADAVIST GADOBUTROL 1 MMOL/ML IV SOLN COMPARISON:  Same day CT chest FINDINGS: MRI CERVICAL SPINE FINDINGS Motion degraded exam. Alignment: Trace anterolisthesis at C2-C3. Vertebrae: No evidence of acute fracture or aggressive osseous lesion. Cord: Normal signal and morphology. Posterior Fossa, vertebral arteries, paraspinal tissues: Preserved vertebral artery flow voids. The posterior fossa is unremarkable. There is soft tissue edema and enhancement along the right posterior paraspinal soft tissues adjacent to the right C2-C3 facet. There is paraspinal soft tissue edema along the upper thoracic spine extending into the anterior lower aspect of the cervical spine to level of C6, described below. Disc levels: The craniocervical junction is unremarkable. C2-C3: Trace degenerative anterolisthesis. Advanced right-sided facet arthropathy with cortical irregularity, periarticular marrow edema and enhancement, and a right-sided facet effusion.  Mild bilateral neural foraminal stenosis.Minimal spinal canal narrowing. Right sided ventral and lateral epidural phlegmon measuring up to 2 mm in thicknes C3-C4: Minimal disc bulging. Minimal spinal canal narrowing. Mild bilateral facet arthropathy.Moderate left neural foraminal stenosis. No right neural foraminal stenosis. C4-C5: Asymmetric right disc bulging. Mild bilateral facet arthropathy. Minimal canal narrowing. Mild right neural foraminal stenosis. No left neural foraminal stenosis. C5-C6: Posterior disc osteophyte complex and uncovertebral joint hypertrophy. Mild spinal canal stenosis. Moderate bilateral neural foraminal stenosis, left greater than right. C6-C7: Posterior disc osteophyte complex. Minimal canal narrowing. Mild bilateral neural foraminal stenosis. C7-T1: No significant spinal canal or neural foraminal narrowing. MRI THORACIC SPINE FINDINGS Alignment:  Physiologic. Vertebrae: There is endplate irregularity, marrow edema and enhancement at T2-T3 with minimally increased abnormal signal in the intervertebral disc. Focal T1 and T2 hyperintense lesion within the posterior aspect of the T10 vertebral body on the left near the pedicle with central signal dropout on STIR likely an atypical hemangioma. Scattered additional degenerative endplate changes. Cord: No definite abnormal cord signal. Paraspinal and other soft tissues: There is extensive anterior and lateral paraspinal soft tissue edema and enhancement spanning T2 through T6 consistent with phlegmon. There is thin phlegmonous extension into the anterior paraspinal soft tissues of the lower cervical spine to the level of C6 (cervical spine MRI sagittal postcontrast image 3). Disc levels: There is disc bulging at T2-T3, asymmetric right resulting in moderate to severe right-sided neural foraminal stenosis. There is ventral and dorsal epidural phlegmon spanning from T2-T4 ventrally and T1-T5 dorsally, measuring up to 2-3 mm and thickness. There  is multilevel disc desiccation throughout the thoracic spine, with additional mild disc bulging at T3-T4 resulting in mild bilateral neural foraminal stenosis and minimal disc bulging at T10-T11 and T11-T12 resulting in no significant stenosis. 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 There is also adjacent dorsal and ventral epidural phlegmon measuring up to 2-3 mm in thickness, spanning from T2-T4 ventrally and T1-T5 dorsally. 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 Right sided ventral and lateral epidural phlegmon at C2-C3 measuring up to 2 mm in thickness. Degenerative changes in the cervical and thoracic spine as described above. Electronically Signed   By: Caprice Renshaw M.D.   On: 10/10/2021 22:02   ECHOCARDIOGRAM COMPLETE  Result Date: 10/11/2021    ECHOCARDIOGRAM REPORT   Patient Name:   Cleburne Henle Date of Exam: 10/11/2021 Medical Rec #:  [id number]   Height:  67.0 in Accession #:    [id number]  Weight:       175.0 lb Date of Birth:  1975-11-22    BSA:          1.911 m Patient Age:    46 years    BP:           111/67 mmHg Patient Gender: M           HR:           72 bpm. Exam Location:  [facility] Procedure: 2D Echo, Cardiac Doppler and Color Doppler Indications:     Bacteremia R78.81  History:         Patient has no prior history of Echocardiogram examinations.                  Hypokalemia , History of IV drug abuse.  Sonographer:     Cristela Blue Referring Phys:  [id number] Sunnie Nielsen Diagnosing Phys: Julien Nordmann MD  Sonographer Comments: Suboptimal apical window. IMPRESSIONS  1. Left ventricular ejection fraction, by estimation, is 60 to 65%. The left ventricle has normal function. The left ventricle  has no regional wall motion abnormalities. Left ventricular diastolic parameters were normal.  2. Right ventricular systolic function is normal. The right ventricular size is normal. There is normal pulmonary artery systolic pressure. The estimated right ventricular systolic pressure is 17.8 mmHg.  3. The mitral valve is normal in structure. Mild mitral valve regurgitation. No evidence of mitral stenosis.  4. The aortic valve is normal in structure. Aortic valve regurgitation is not visualized. Aortic valve sclerosis is present, with no evidence of aortic valve stenosis.  5. The inferior vena cava is normal in size with greater than 50% respiratory variability, suggesting right atrial pressure of 3 mmHg.  6. No valve vegetation noted. FINDINGS  Left Ventricle: Left ventricular ejection fraction, by estimation, is 60 to 65%. The left ventricle has normal function. The left ventricle has no regional wall motion abnormalities. The left ventricular internal cavity size was normal in size. There is  no left ventricular hypertrophy. Left ventricular diastolic parameters were normal. Right Ventricle: The right ventricular size is normal. No increase in right ventricular wall thickness. Right ventricular systolic function is normal. There is normal pulmonary artery systolic pressure. The tricuspid regurgitant velocity is 1.79 m/s, and  with an assumed right atrial pressure of 5 mmHg, the estimated right ventricular systolic pressure is 17.8 mmHg. Left Atrium: Left atrial size was normal in size. Right Atrium: Right atrial size was normal in size. Pericardium: There is no evidence of pericardial effusion. Mitral Valve: The mitral valve is normal in structure. Mild mitral valve regurgitation. No evidence of mitral valve stenosis. MV peak gradient, 3.6 mmHg. The mean mitral valve gradient is 2.0 mmHg. Tricuspid Valve: The tricuspid valve is normal in structure. Tricuspid valve regurgitation is mild . No evidence of tricuspid  stenosis. Aortic Valve: The aortic valve is normal in structure. Aortic valve regurgitation is not visualized. Aortic valve sclerosis is present, with no evidence of aortic valve stenosis. Aortic valve mean gradient measures 3.0 mmHg. Aortic valve peak gradient measures 6.0 mmHg. Aortic valve area, by VTI measures 2.26 cm. Pulmonic Valve: The pulmonic valve was normal in structure. Pulmonic valve regurgitation is not visualized. No evidence of pulmonic stenosis. Aorta: The aortic root is normal in size and structure. Venous: The inferior vena cava is normal in size with greater than 50% respiratory variability, suggesting right atrial pressure of 3 mmHg. IAS/Shunts:  No atrial level shunt detected by color flow Doppler.  LEFT VENTRICLE PLAX 2D LVIDd:         4.70 cm   Diastology LVIDs:         2.80 cm   LV e' medial:    9.25 cm/s LV PW:         0.90 cm   LV E/e' medial:  8.0 LV IVS:        0.70 cm   LV e' lateral:   16.50 cm/s LVOT diam:     2.00 cm   LV E/e' lateral: 4.5 LV SV:         42 LV SV Index:   22 LVOT Area:     3.14 cm  RIGHT VENTRICLE RV Basal diam:  3.90 cm RV S prime:     12.90 cm/s TAPSE (M-mode): 2.3 cm LEFT ATRIUM             Index        RIGHT ATRIUM           Index LA diam:        3.50 cm 1.83 cm/m   RA Area:     16.60 cm LA Vol (A2C):   24.4 ml 12.77 ml/m  RA Volume:   45.30 ml  23.71 ml/m LA Vol (A4C):   36.7 ml 19.21 ml/m LA Biplane Vol: 30.0 ml 15.70 ml/m  AORTIC VALVE                    PULMONIC VALVE AV Area (Vmax):    2.30 cm     PV Vmax:        1.02 m/s AV Area (Vmean):   2.10 cm     PV Vmean:       68.800 cm/s AV Area (VTI):     2.26 cm     PV VTI:         0.175 m AV Vmax:           122.00 cm/s  PV Peak grad:   4.2 mmHg AV Vmean:          82.100 cm/s  PV Mean grad:   2.0 mmHg AV VTI:            0.186 m      RVOT Peak grad: 5 mmHg AV Peak Grad:      6.0 mmHg AV Mean Grad:      3.0 mmHg LVOT Vmax:         89.40 cm/s LVOT Vmean:        54.800 cm/s LVOT VTI:          0.134 m LVOT/AV  VTI ratio: 0.72  AORTA Ao Root diam: 3.60 cm MITRAL VALVE               TRICUSPID VALVE MV Area (PHT): 4.41 cm    TR Peak grad:   12.8 mmHg MV Area VTI:   1.97 cm    TR Vmax:        179.00 cm/s MV Peak grad:  3.6 mmHg MV Mean grad:  2.0 mmHg    SHUNTS MV Vmax:       0.95 m/s    Systemic VTI:  0.13 m MV Vmean:      64.5 cm/s   Systemic Diam: 2.00 cm MV Decel Time: 172 msec    Pulmonic VTI:  0.147 m MV E velocity: 74.10 cm/s MV A velocity: 56.60 cm/s MV E/A ratio:  1.31 Julien Nordmannimothy Gollan MD Electronically signed by Julien Nordmannimothy Gollan MD Signature Date/Time: 10/11/2021/2:55:05 PM    Final             LOS: 3 days    Time spent: 35 min    Sunnie NielsenNatalie Canyon Lohr, DO [facility] 10/13/2021, 12:18 PM   Staff may message me via secure chat in Epic  but this may not receive immediate response,  please page for urgent matters!  If 7PM-7AM, please contact night-coverage [email or web address]  Dictation software was used to generate the above note. Typos may occur and escape review, as with typed/written notes. Please contact Dr Lyn HollingsheadAlexander directly for clarity if needed.

## 2021-10-13 NOTE — Progress Notes (Signed)
Pt loud and verbally combative towards staff, making threatening gestures with his hand and using profanity.  Pt is upset about pain medication.  He has received IV and PO pain medication and is refusing to listen to staff explain to him pain medication regimen and that MD would have to be notified because there is no other  pain medication to offer pt at this time.  He was offered Tylenol by assigned nurse and pt became even more upset.  He threatened to leave and go to [facility].  Will continue to monitor and update as needed.

## 2021-10-14 LAB — BASIC METABOLIC PANEL
Anion gap: 9 (ref 5–15)
BUN: 11 mg/dL (ref 6–20)
CO2: 26 mmol/L (ref 22–32)
Calcium: 8.4 mg/dL — ABNORMAL LOW (ref 8.9–10.3)
Chloride: 101 mmol/L (ref 98–111)
Creatinine, Ser: 0.67 mg/dL (ref 0.61–1.24)
GFR, Estimated: >60 mL/min (ref >60)
Glucose, Bld: 112 mg/dL — ABNORMAL HIGH (ref 70–99)
Potassium: 2.9 mmol/L — ABNORMAL LOW (ref 3.5–5.1)
Sodium: 136 mmol/L (ref 135–145)

## 2021-10-14 LAB — CBC
HCT: 31.4 % — ABNORMAL LOW (ref 39.0–52.0)
Hemoglobin: 10.7 g/dL — ABNORMAL LOW (ref 13.0–17.0)
MCH: 29.5 pg (ref 26.0–34.0)
MCHC: 34.1 g/dL (ref 30.0–36.0)
MCV: 86.5 fL (ref 80.0–100.0)
Platelets: 398 10*3/uL (ref 150–400)
RBC: 3.63 MIL/uL — ABNORMAL LOW (ref 4.22–5.81)
RDW: 12.8 % (ref 11.5–15.5)
WBC: 11.7 10*3/uL — ABNORMAL HIGH (ref 4.0–10.5)
nRBC: 0 % (ref 0.0–0.2)

## 2021-10-14 LAB — SEDIMENTATION RATE: Sed Rate: 128 mm/hr — ABNORMAL HIGH (ref 0–15)

## 2021-10-14 LAB — VANCOMYCIN, TROUGH: Vancomycin Tr: 8 ug/mL — ABNORMAL LOW (ref 15–20)

## 2021-10-14 MED ORDER — VANCOMYCIN HCL IN DEXTROSE 1-5 GM/200ML-% IV SOLN
1000.0000 mg | Freq: Three times a day (TID) | INTRAVENOUS | Status: AC
  Administered 2021-10-14 (×2): 1000 mg via INTRAVENOUS
  Filled 2021-10-14 (×3): qty 200

## 2021-10-14 MED ORDER — SODIUM CHLORIDE 0.9 % IV SOLN
10.0000 mg/kg | Freq: Every day | INTRAVENOUS | Status: DC
  Administered 2021-10-15 – 2021-11-18 (×35): 800 mg via INTRAVENOUS
  Filled 2021-10-14 (×36): qty 16

## 2021-10-14 MED ORDER — VANCOMYCIN HCL 1500 MG/300ML IV SOLN
1500.0000 mg | Freq: Two times a day (BID) | INTRAVENOUS | Status: DC
  Administered 2021-10-14: 1500 mg via INTRAVENOUS
  Filled 2021-10-14 (×2): qty 300

## 2021-10-14 NOTE — Progress Notes (Addendum)
PROGRESS NOTE    Cameron Hammond  ZOX:[id number] DOB: 1975-06-29  DOA: 10/10/2021 Date of Service: 10/14/21 PCP: Patient, No Pcp Per (Inactive)     Brief Narrative / Hospital Course:  Mr. Ciavarella is a 46 year old male PMH back pain, IVDU last use 6 months prior to admission (varying history on this).  Presented to ED 10/10/21 complaining of pain in head, neck, upper back x1 week, no relief with chiropractor. 05/25: In ED CT head/cervical spine and CXR negative, CTA chest no PE, paraspinal soft tissue thickening extending from T2-T5 concerning for osteomyelitis.  WBC 17.3.  Started on vancomycin, Zosyn.  Vital signs initially normal, later transient tachycardia/tachypnea, presumably due to pain, requiring IV Dilaudid for pain control.   MRI thoracic and cervical spine: Discitis/osteomyelitis at 2 to T3, extensive adjacent anterior and lateral paraspinal phlegmon extending to C6 and to T6, dorsal and ventral phlegmon T2-T4 ventrally and T1-T5 dorsally, probable septic arthritis of the right C2-C3 facet joint with adjacent facet osteomyelitis and right paraspinal soft tissue inflammation, right sided ventral and lateral phlegmon C2-C3.   MRSA on blood culture 3 of 4 bottles, Zosyn was d/c, vancomycin was continued. 05/26: WBC trending down to 15.4. ID advising neurosurgery consult to eval for washout/procedure to reduce bioburden. TEE may not be needed since will not change therapy of at least 6 weeks abx IV. Repeat BCx in 48 hours to eval for clearance. Neurosurgery reviewed and communicated w/ me via secure chat, no plan at this point for surgical intervention.  05/27: pt reporting pain, see nurse notes from overnight. Otherwise remains of IV abx, ID to peripherally follow through the weekend (today is Saturday, Monday is Memorial Day). UTox (+)opiates (from inpatient administration meds), cannabinoids, tricyclics.  05/28: pain better on scheduled Norco 10 q6h, repeat cultures pending 05/29: (+) repeat  blood cultures, ID following, I have asked neurosurgery to reevaluate ([name])  Consultants:  Infectious disease Neurosurgery   Procedures: Transthoracic Echo     Subjective: Patient reports pain is not well controlled after about 4 hours on meds. Requesting increase, states Dilaudid isn't working. He is sitting calmly in chair when I saw him on rounds.      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  ID consult: Continue abx for now, repeat BCx pending results. Patient will require long-term antibiotic treatment 6 weeks, however w/ history of IVDU would be reluctant to discharge with venous access, appreciate recs regarding outpatient abx plan on eventual discharge.  Neurosurgical input via secure chat 05/26: MRI no discrete abscess, and no neurologic deficits to warrant emergent intervention, will continue neurologic checks q shift and prn, neurosurgery advises no intervention / no surgical procedure at this time since unlikely to drain anything, if needed could consider IR to sample  Pain control with toradol and oxycodone scheduled, will work on tapering down/off opiates, dilaudid IV for breakthrough but have instructed RN to avoid giving this if possible  Repeat (+)blood  cultures 10/13/21, ID following, have asked neurosurgery to reevaluate (secure chat to [name] 05/29)  Septic  arthritis of cervical spine (HCC) Management as above  History of intravenous drug abuse (HCC) States last use was six months ago Counseled on abstinence  Hypokalemia Oral repletion and monitor    DVT prophylaxis: lovenox Code Status: FULL Family Communication: none at this time Disposition Plan / TOC needs: remains inpatient for now for IV abx and close monitoring neurological status, no TOC needs at this time. Hoping to keep here through the weekend, confirm negative repeat blood cultures or further treatment if positive, improve pain control, await ID recs re: outpatient abx plan,  Barriers to discharge / significant pending items: see above             Objective: Vitals:   10/13/21 1712 10/13/21 1934 10/14/21 0359 10/14/21 0817  BP: 124/70 133/72 114/71 122/79  Pulse: 81 81 79 93  Resp: 18 17 18 16   Temp: 99.1 F (37.3 C) 98.5 F (36.9 C) 99.4 F (37.4 C) 99.2 F (37.3 C)  TempSrc: Oral Oral Oral   SpO2: 100% 96% 99% 100%  Weight:      Height:        Intake/Output Summary (Last 24 hours) at 10/14/2021 1001 Last data filed at 10/14/2021 1610 Gross per 24 hour  Intake 240 ml  Output 300 ml  Net -60 ml   Filed Weights   10/10/21 1152  Weight: 79.4 kg    Examination:  Constitutional:  VS as above General Appearance: alert, well-developed, well-nourished, NAD Eyes: Normal lids and conjunctive, non-icteric sclera Ears, Nose, Mouth, Throat: Normal appearance Neck: No masses, trachea midline Respiratory: Normal respiratory effort Cardiovascular: S1/S2 normal, RRR No lower extremity edema Musculoskeletal:  No clubbing/cyanosis of digits Neurological: No cranial nerve deficit on limited exam Motor and sensation intact and symmetric Psychiatric: Normal judgment/insight Normal mood and affect       Scheduled Medications:   enoxaparin (LOVENOX) injection  40 mg Subcutaneous Q24H   HYDROcodone-acetaminophen  1 tablet Oral Q6H    orphenadrine  60 mg Intravenous Q12H    Continuous Infusions:  vancomycin 1,500 mg (10/14/21 0416)    PRN Medications:  acetaminophen **OR** acetaminophen, cyclobenzaprine, ketorolac, ondansetron **OR** ondansetron (ZOFRAN) IV  Antimicrobials:  Anti-infectives (From admission, onward)    Start     Dose/Rate Route Frequency Ordered Stop   10/14/21 0400  vancomycin (VANCOREADY) IVPB 1500 mg/300 mL        1,500 mg 150 mL/hr over 120 Minutes Intravenous Every 12 hours 10/14/21 0331     10/11/21 0400  vancomycin (VANCOREADY) IVPB 1250 mg/250 mL  Status:  Discontinued        1,250 mg 166.7 mL/hr over 90 Minutes Intravenous Every 12 hours 10/10/21 2300 10/14/21 0330   10/10/21 2300  piperacillin-tazobactam (ZOSYN) IVPB 3.375 g  Status:  Discontinued        3.375 g 12.5 mL/hr over 240 Minutes Intravenous Every 8 hours 10/10/21 2258 10/11/21 0612   10/10/21 1530  vancomycin (VANCOREADY) IVPB 1500 mg/300 mL        1,500 mg 150 mL/hr over 120 Minutes Intravenous  Once 10/10/21 1517 10/10/21 1841   10/10/21 1530  piperacillin-tazobactam (ZOSYN) IVPB 3.375 g        3.375 g 100 mL/hr over 30 Minutes Intravenous  Once 10/10/21 1517 10/10/21 1603       Data Reviewed: I have personally reviewed following labs and imaging studies  CBC: Recent Labs  Lab 10/10/21 1304 10/11/21 0554 10/12/21 0423 10/14/21 0246  WBC 17.3* 15.4* 13.0* 11.7*  NEUTROABS 14.8*  --   --   --   HGB 13.7 13.1 12.3* 10.7*  HCT 41.1 38.8* 36.1* 31.4*  MCV 88.8 89.6 87.4 86.5  PLT 287 283 296 398   Basic Metabolic Panel: Recent Labs  Lab 10/10/21 1304 10/12/21 0423 10/14/21 0246  NA 138 135 136  K 3.4* 3.2* 2.9*  CL 100 98 101  CO2 GLUCOSE 133* 110* 112*  BUN CREATININE 0.79 0.68 0.67  CALCIUM 9.1 8.4* 8.4*   GFR: Estimated Creatinine Clearance: 117.8 mL/min (by C-G formula based on SCr of 0.67 mg/dL). Liver Function Tests: No results for input(s): AST, ALT, ALKPHOS, BILITOT,  PROT, ALBUMIN in the last 168 hours. No results for input(s): LIPASE, AMYLASE in the last 168 hours. No results for input(s): AMMONIA in the last 168 hours. Coagulation Profile: No results for input(s): INR, PROTIME in the last 168 hours. Cardiac Enzymes: No results for input(s): CKTOTAL, CKMB, CKMBINDEX, TROPONINI in the last 168 hours. BNP (last 3 results) No results for input(s): PROBNP in the last 8760 hours. HbA1C: No results for input(s): HGBA1C in the last 72 hours. CBG: No results for input(s): GLUCAP in the last 168 hours. Lipid Profile: No results for input(s): CHOL, HDL, LDLCALC, TRIG, CHOLHDL, LDLDIRECT in the last 72 hours. Thyroid Function Tests: No results for input(s): TSH, T4TOTAL, FREET4, T3FREE, THYROIDAB in the last 72 hours. Anemia Panel: No results for input(s): VITAMINB12, FOLATE, FERRITIN, TIBC, IRON, RETICCTPCT in the last 72 hours. Urine analysis:    Component Value Date/Time   COLORURINE AMBER (A) 11/19/2012 1957   APPEARANCEUR CLEAR 11/19/2012 1957   LABSPEC 1.027 11/19/2012 1957   PHURINE 6.5 11/19/2012 1957   GLUCOSEU NEGATIVE 11/19/2012 1957   HGBUR NEGATIVE 11/19/2012 1957   BILIRUBINUR SMALL (A) 11/19/2012 1957   KETONESUR 15 (A) 11/19/2012 1957   PROTEINUR 30 (A) 11/19/2012 1957   UROBILINOGEN 1.0 11/19/2012 1957   NITRITE NEGATIVE 11/19/2012 1957   LEUKOCYTESUR NEGATIVE 11/19/2012 1957   Sepsis Labs: (procalcitonin:4,lacticidven:4)  Recent Results (from the past 240 hour(s))  Blood culture (routine x 2)     Status: Abnormal   Collection Time: 10/10/21  3:19 PM   Specimen: BLOOD LEFT FOREARM  Result Value Ref Range Status   Specimen Description   Final    BLOOD LEFT FOREARM Performed at [facility], [address] [place], [place] [address]    Special Requests   Final    BOTTLES DRAWN AEROBIC AND ANAEROBIC Blood Culture adequate volume Performed at [facility], [address] [place], [place]  [address]    Culture  Setup Time   Final    Organism ID to follow GRAM POSITIVE COCCI IN BOTH AEROBIC AND ANAEROBIC BOTTLES CRITICAL RESULT CALLED TO, READ BACK BY AND VERIFIED WITH: JASON ROBBINS AT 0981 10/11/21.PMF Performed at [facility], [address] [place], [place] [address]    Culture METHICILLIN RESISTANT STAPHYLOCOCCUS AUREUS (A)  Final   Report Status 10/13/2021 FINAL  Final   Organism ID, Bacteria METHICILLIN RESISTANT STAPHYLOCOCCUS AUREUS  Final      Susceptibility   Methicillin resistant staphylococcus aureus - MIC*    CIPROFLOXACIN <=0.5 SENSITIVE Sensitive     ERYTHROMYCIN >=8 RESISTANT Resistant     GENTAMICIN <=0.5 SENSITIVE Sensitive     OXACILLIN >=4 RESISTANT Resistant     TETRACYCLINE <=1 SENSITIVE  Sensitive     VANCOMYCIN <=0.5 SENSITIVE Sensitive     TRIMETH/SULFA <=10 SENSITIVE Sensitive     CLINDAMYCIN <=0.25 SENSITIVE Sensitive     RIFAMPIN <=0.5 SENSITIVE Sensitive     Inducible Clindamycin NEGATIVE Sensitive     * METHICILLIN RESISTANT STAPHYLOCOCCUS AUREUS  Blood Culture ID Panel (Reflexed)     Status: Abnormal   Collection Time: 10/10/21  3:19 PM  Result Value Ref Range Status   Enterococcus faecalis NOT DETECTED NOT DETECTED Final   Enterococcus Faecium NOT DETECTED NOT DETECTED Final   Listeria monocytogenes NOT DETECTED NOT DETECTED Final   Staphylococcus species DETECTED (A) NOT DETECTED Final    Comment: CRITICAL RESULT CALLED TO, READ BACK BY AND VERIFIED WITH: JASON ROBBINS AT 0608 10/11/21.PMF    Staphylococcus aureus (BCID) DETECTED (A) NOT DETECTED Final    Comment: Methicillin (oxacillin)-resistant Staphylococcus aureus (MRSA). MRSA is predictably resistant to beta-lactam antibiotics (except ceftaroline). Preferred therapy is vancomycin unless clinically contraindicated. Patient requires contact precautions if  hospitalized. CRITICAL RESULT CALLED TO, READ BACK BY AND VERIFIED WITH: JASON ROBBINS AT 4098 10/11/21.PMF     Staphylococcus epidermidis NOT DETECTED NOT DETECTED Final   Staphylococcus lugdunensis NOT DETECTED NOT DETECTED Final   Streptococcus species NOT DETECTED NOT DETECTED Final   Streptococcus agalactiae NOT DETECTED NOT DETECTED Final   Streptococcus pneumoniae NOT DETECTED NOT DETECTED Final   Streptococcus pyogenes NOT DETECTED NOT DETECTED Final   A.calcoaceticus-baumannii NOT DETECTED NOT DETECTED Final   Bacteroides fragilis NOT DETECTED NOT DETECTED Final   Enterobacterales NOT DETECTED NOT DETECTED Final   Enterobacter cloacae complex NOT DETECTED NOT DETECTED Final   Escherichia coli NOT DETECTED NOT DETECTED Final   Klebsiella aerogenes NOT DETECTED NOT DETECTED Final   Klebsiella oxytoca NOT DETECTED NOT DETECTED Final   Klebsiella pneumoniae NOT DETECTED NOT DETECTED Final   Proteus species NOT DETECTED NOT DETECTED Final   Salmonella species NOT DETECTED NOT DETECTED Final   Serratia marcescens NOT DETECTED NOT DETECTED Final   Haemophilus influenzae NOT DETECTED NOT DETECTED Final   Neisseria meningitidis NOT DETECTED NOT DETECTED Final   Pseudomonas aeruginosa NOT DETECTED NOT DETECTED Final   Stenotrophomonas maltophilia NOT DETECTED NOT DETECTED Final   Candida albicans NOT DETECTED NOT DETECTED Final   Candida auris NOT DETECTED NOT DETECTED Final   Candida glabrata NOT DETECTED NOT DETECTED Final   Candida krusei NOT DETECTED NOT DETECTED Final   Candida parapsilosis NOT DETECTED NOT DETECTED Final   Candida tropicalis NOT DETECTED NOT DETECTED Final   Cryptococcus neoformans/gattii NOT DETECTED NOT DETECTED Final   Meth resistant mecA/C and MREJ DETECTED (A) NOT DETECTED Final    Comment: CRITICAL RESULT CALLED TO, READ BACK BY AND VERIFIED WITH: JASON ROBBINS AT 0608 10/11/21.PMF Performed at [facility] [address] [place], [place] [address]   Blood culture (routine x 2)     Status: Abnormal   Collection Time: 10/10/21  3:24 PM   Specimen: Right  Antecubital; Blood  Result Value Ref Range Status   Specimen Description   Final    RIGHT ANTECUBITAL Performed at [facility], [address] [place], [place] [address]    Special Requests   Final    BOTTLES DRAWN AEROBIC AND ANAEROBIC Blood Culture adequate volume Performed at [facility], [address], [place], [place] [address]    Culture  Setup Time   Final    GRAM POSITIVE COCCI IN BOTH AEROBIC AND ANAEROBIC BOTTLES CRITICAL RESULT CALLED  TO, READ BACK BY AND VERIFIED WITH: JASON ROBBINS AT 1610 10/11/21.PMF Performed at [facility], [address] [place], [place] [address]    Culture (A)  Final    STAPHYLOCOCCUS AUREUS SUSCEPTIBILITIES PERFORMED ON PREVIOUS CULTURE WITHIN THE LAST 5 DAYS. Performed at [facility], [address], [place], [place] [address]    Report Status 10/13/2021 FINAL  Final  Culture, blood (Routine X 2) w Reflex to ID Panel     Status: None (Preliminary result)   Collection Time: 10/13/21  4:31 AM   Specimen: BLOOD  Result Value Ref Range Status   Specimen Description BLOOD LEFT ANTECUBITAL  Final   Special Requests   Final    BOTTLES DRAWN AEROBIC AND ANAEROBIC Blood Culture adequate volume   Culture  Setup Time   Final    GRAM POSITIVE COCCI ANAEROBIC BOTTLE ONLY CRITICAL RESULT CALLED TO, READ BACK BY AND VERIFIED WITH: Lyndon Code 2336 10/13/21 LFD Performed at [facility], [address], [place], [place] [address]    Culture GRAM POSITIVE COCCI  Final   Report Status PENDING  Incomplete  Culture, blood (Routine X 2) w Reflex to ID Panel     Status: None (Preliminary result)   Collection Time: 10/13/21  4:32 AM   Specimen: BLOOD  Result Value Ref Range Status   Specimen Description   Final    BLOOD RIGHT ANTECUBITAL Performed at [facility], [address] [place], [place] [address]    Special Requests   Final    BOTTLES DRAWN AEROBIC AND ANAEROBIC Blood Culture  adequate volume Performed at [facility], [address] [place], [place] [address]    Culture  Setup Time   Final    GRAM POSITIVE COCCI ANAEROBIC BOTTLE ONLY CRITICAL RESULT CALLED TO, READ BACK BY AND VERIFIED WITH: JASON ROBINS @ 2236 10/13/21 LFD Performed at [facility], [address] [place], [place] [address]    Culture GRAM POSITIVE COCCI  Final   Report Status PENDING  Incomplete  Blood Culture ID Panel (Reflexed)     Status: Abnormal   Collection Time: 10/13/21  4:32 AM  Result Value Ref Range Status   Enterococcus faecalis NOT DETECTED NOT DETECTED Final   Enterococcus Faecium NOT DETECTED NOT DETECTED Final   Listeria monocytogenes NOT DETECTED NOT DETECTED Final   Staphylococcus species DETECTED (A) NOT DETECTED Final    Comment: CRITICAL RESULT CALLED TO, READ BACK BY AND VERIFIED WITH: JASON ROBINS@ 2236 10/13/21 LFD    Staphylococcus aureus (BCID) DETECTED (A) NOT DETECTED Final    Comment: Methicillin (oxacillin)-resistant Staphylococcus aureus (MRSA). MRSA is predictably resistant to beta-lactam antibiotics (except ceftaroline). Preferred therapy is vancomycin unless clinically contraindicated. Patient requires contact precautions if  hospitalized. CRITICAL RESULT CALLED TO, READ BACK BY AND VERIFIED WITH: JASON ROBINS@ 2236 10/13/21 LFD    Staphylococcus epidermidis NOT DETECTED NOT DETECTED Final   Staphylococcus lugdunensis NOT DETECTED NOT DETECTED Final   Streptococcus species NOT DETECTED NOT DETECTED Final   Streptococcus agalactiae NOT DETECTED NOT DETECTED Final   Streptococcus pneumoniae NOT DETECTED NOT DETECTED Final   Streptococcus pyogenes NOT DETECTED NOT DETECTED Final   A.calcoaceticus-baumannii NOT DETECTED NOT DETECTED Final   Bacteroides fragilis NOT DETECTED NOT DETECTED Final   Enterobacterales NOT DETECTED NOT DETECTED Final   Enterobacter cloacae complex NOT DETECTED NOT DETECTED Final   Escherichia coli NOT  DETECTED NOT DETECTED Final   Klebsiella aerogenes NOT DETECTED NOT DETECTED  Final   Klebsiella oxytoca NOT DETECTED NOT DETECTED Final   Klebsiella pneumoniae NOT DETECTED NOT DETECTED Final   Proteus species NOT DETECTED NOT DETECTED Final   Salmonella species NOT DETECTED NOT DETECTED Final   Serratia marcescens NOT DETECTED NOT DETECTED Final   Haemophilus influenzae NOT DETECTED NOT DETECTED Final   Neisseria meningitidis NOT DETECTED NOT DETECTED Final   Pseudomonas aeruginosa NOT DETECTED NOT DETECTED Final   Stenotrophomonas maltophilia NOT DETECTED NOT DETECTED Final   Candida albicans NOT DETECTED NOT DETECTED Final   Candida auris NOT DETECTED NOT DETECTED Final   Candida glabrata NOT DETECTED NOT DETECTED Final   Candida krusei NOT DETECTED NOT DETECTED Final   Candida parapsilosis NOT DETECTED NOT DETECTED Final   Candida tropicalis NOT DETECTED NOT DETECTED Final   Cryptococcus neoformans/gattii NOT DETECTED NOT DETECTED Final   Meth resistant mecA/C and MREJ DETECTED (A) NOT DETECTED Final    Comment: CRITICAL RESULT CALLED TO, READ BACK BY AND VERIFIED WITH: JASON ROBINS @ 2236 10/13/21 LFD Performed at [facility], [address], [place], [place] [address]          Radiology Studies last 96 hours: DG Chest 2 View  Result Date: 10/10/2021 CLINICAL DATA:  Chest pain. EXAM: CHEST - 2 VIEW COMPARISON:  06/02/2020 and prior studies FINDINGS: The cardiomediastinal silhouette is unremarkable. There is no evidence of focal airspace disease, pulmonary edema, suspicious pulmonary nodule/mass, pleural effusion, or pneumothorax. No acute bony abnormalities are identified. IMPRESSION: No active cardiopulmonary disease. Electronically Signed   By: Harmon Pier M.D.   On: 10/10/2021 13:37   CT Head Wo Contrast  Result Date: 10/10/2021 CLINICAL DATA:  Headache, new or worsening headache in 46 year old male. EXAM: CT HEAD WITHOUT CONTRAST CT CERVICAL SPINE WITHOUT  CONTRAST TECHNIQUE: Multidetector CT imaging of the head and cervical spine was performed following the standard protocol without intravenous contrast. Multiplanar CT image reconstructions of the cervical spine were also generated. RADIATION DOSE REDUCTION: This exam was performed according to the departmental dose-optimization program which includes automated exposure control, adjustment of the mA and/or kV according to patient size and/or use of iterative reconstruction technique. COMPARISON:  July 4th of 2014 CT of the face. FINDINGS: CT HEAD FINDINGS Brain: No evidence of acute infarction, hemorrhage, hydrocephalus, extra-axial collection or mass lesion/mass effect. Vascular: No hyperdense vessel or unexpected calcification. Skull: Normal. Negative for fracture or focal lesion. Sinuses/Orbits: Visualized paranasal sinuses and orbits are unremarkable to the extent evaluated. Other: None CT CERVICAL SPINE FINDINGS Alignment: Mild anterolisthesis of C2 on C3 approximately 2 mm, associated with facet arthropathy that is greatest on the RIGHT. Skull base and vertebrae: No acute fracture. No primary bone lesion or focal pathologic process. Soft tissues and spinal canal: No prevertebral fluid or swelling. No visible canal hematoma. Disc levels: Multilevel degenerative changes in the cervical spine. Facet arthropathy greatest at C2-3 as discussed. Multiple levels of facet arthropathy and multiple levels of disc space narrowing. Disc space narrowing and osteophyte formation greatest in mid cervical spine and lower cervical spine at C5-6 and C6-7. Upper chest: Negative. Other: None IMPRESSION: 1. No acute intracranial abnormality. 2. No acute fracture or traumatic subluxation of the cervical spine. 3. Multilevel degenerative changes in the cervical spine. Electronically Signed   By: Donzetta Kohut M.D.   On: 10/10/2021 13:37   CT Angio Chest PE W and/or Wo Contrast  Result Date: 10/10/2021 CLINICAL DATA:  Upper back  pain EXAM: CT ANGIOGRAPHY CHEST WITH CONTRAST TECHNIQUE:  Multidetector CT imaging of the chest was performed using the standard protocol during bolus administration of intravenous contrast. Multiplanar CT image reconstructions and MIPs were obtained to evaluate the vascular anatomy. RADIATION DOSE REDUCTION: This exam was performed according to the departmental dose-optimization program which includes automated exposure control, adjustment of the mA and/or kV according to patient size and/or use of iterative reconstruction technique. CONTRAST:  75mL OMNIPAQUE IOHEXOL 350 MG/ML SOLN COMPARISON:  None Available. FINDINGS: Cardiovascular: Normal heart size. No pericardial effusion. Normal caliber thoracic aorta with no significant atherosclerotic disease. Adequate contrast opacification of the pulmonary arteries. No evidence of pulmonary embolus, although evaluation of the lower lobe segmental and subsegmental pulmonary arteries is limited due to respiratory motion artifact. Mediastinum/Nodes: Esophagus and thyroid are unremarkable. No pathologically enlarged lymph nodes seen in the chest. Lungs/Pleura: Central airways are patent. No consolidation, pleural effusion or pneumothorax. Mild bibasilar atelectasis. Upper Abdomen: No acute abnormality. Musculoskeletal: Paraspinal soft tissue thickening extending from T2-T5. Schmorl's node involving the inferior endplate of T2. No aggressive appearing osseous lesions. Review of the MIP images confirms the above findings. IMPRESSION: 1. No evidence of pulmonary embolus. 2. Paraspinal soft tissue thickening extending from T2-T5, concerning for osteomyelitis. Recommend contrast-enhanced MRI of the thoracic spine for further evaluation. Electronically Signed   By: Allegra Lai M.D.   On: 10/10/2021 14:36   CT Cervical Spine Wo Contrast  Result Date: 10/10/2021 CLINICAL DATA:  Headache, new or worsening headache in 46 year old male. EXAM: CT HEAD WITHOUT CONTRAST CT  CERVICAL SPINE WITHOUT CONTRAST TECHNIQUE: Multidetector CT imaging of the head and cervical spine was performed following the standard protocol without intravenous contrast. Multiplanar CT image reconstructions of the cervical spine were also generated. RADIATION DOSE REDUCTION: This exam was performed according to the departmental dose-optimization program which includes automated exposure control, adjustment of the mA and/or kV according to patient size and/or use of iterative reconstruction technique. COMPARISON:  July 4th of 2014 CT of the face. FINDINGS: CT HEAD FINDINGS Brain: No evidence of acute infarction, hemorrhage, hydrocephalus, extra-axial collection or mass lesion/mass effect. Vascular: No hyperdense vessel or unexpected calcification. Skull: Normal. Negative for fracture or focal lesion. Sinuses/Orbits: Visualized paranasal sinuses and orbits are unremarkable to the extent evaluated. Other: None CT CERVICAL SPINE FINDINGS Alignment: Mild anterolisthesis of C2 on C3 approximately 2 mm, associated with facet arthropathy that is greatest on the RIGHT. Skull base and vertebrae: No acute fracture. No primary bone lesion or focal pathologic process. Soft tissues and spinal canal: No prevertebral fluid or swelling. No visible canal hematoma. Disc levels: Multilevel degenerative changes in the cervical spine. Facet arthropathy greatest at C2-3 as discussed. Multiple levels of facet arthropathy and multiple levels of disc space narrowing. Disc space narrowing and osteophyte formation greatest in mid cervical spine and lower cervical spine at C5-6 and C6-7. Upper chest: Negative. Other: None IMPRESSION: 1. No acute intracranial abnormality. 2. No acute fracture or traumatic subluxation of the cervical spine. 3. Multilevel degenerative changes in the cervical spine. Electronically Signed   By: Donzetta Kohut M.D.   On: 10/10/2021 13:37   MR Cervical Spine W or Wo Contrast  Result Date: 10/10/2021 CLINICAL  DATA:  Myelopathy, acute, cervical spine; Myelopathy, acute, thoracic spine EXAM: MRI CERVICAL AND THORACIC SPINE WITHOUT AND WITH CONTRAST TECHNIQUE: Multiplanar and multiecho pulse sequences of the cervical spine, to include the craniocervical junction and cervicothoracic junction, and the thoracic spine, were obtained without and with intravenous contrast. CONTRAST:  7.20mL GADAVIST GADOBUTROL 1 MMOL/ML IV  SOLN COMPARISON:  Same day CT chest FINDINGS: MRI CERVICAL SPINE FINDINGS Motion degraded exam. Alignment: Trace anterolisthesis at C2-C3. Vertebrae: No evidence of acute fracture or aggressive osseous lesion. Cord: Normal signal and morphology. Posterior Fossa, vertebral arteries, paraspinal tissues: Preserved vertebral artery flow voids. The posterior fossa is unremarkable. There is soft tissue edema and enhancement along the right posterior paraspinal soft tissues adjacent to the right C2-C3 facet. There is paraspinal soft tissue edema along the upper thoracic spine extending into the anterior lower aspect of the cervical spine to level of C6, described below. Disc levels: The craniocervical junction is unremarkable. C2-C3: Trace degenerative anterolisthesis. Advanced right-sided facet arthropathy with cortical irregularity, periarticular marrow edema and enhancement, and a right-sided facet effusion. Mild bilateral neural foraminal stenosis.Minimal spinal canal narrowing. Right sided ventral and lateral epidural phlegmon measuring up to 2 mm in thicknes C3-C4: Minimal disc bulging. Minimal spinal canal narrowing. Mild bilateral facet arthropathy.Moderate left neural foraminal stenosis. No right neural foraminal stenosis. C4-C5: Asymmetric right disc bulging. Mild bilateral facet arthropathy. Minimal canal narrowing. Mild right neural foraminal stenosis. No left neural foraminal stenosis. C5-C6: Posterior disc osteophyte complex and uncovertebral joint hypertrophy. Mild spinal canal stenosis. Moderate  bilateral neural foraminal stenosis, left greater than right. C6-C7: Posterior disc osteophyte complex. Minimal canal narrowing. Mild bilateral neural foraminal stenosis. C7-T1: No significant spinal canal or neural foraminal narrowing. MRI THORACIC SPINE FINDINGS Alignment:  Physiologic. Vertebrae: There is endplate irregularity, marrow edema and enhancement at T2-T3 with minimally increased abnormal signal in the intervertebral disc. Focal T1 and T2 hyperintense lesion within the posterior aspect of the T10 vertebral body on the left near the pedicle with central signal dropout on STIR likely an atypical hemangioma. Scattered additional degenerative endplate changes. Cord: No definite abnormal cord signal. Paraspinal and other soft tissues: There is extensive anterior and lateral paraspinal soft tissue edema and enhancement spanning T2 through T6 consistent with phlegmon. There is thin phlegmonous extension into the anterior paraspinal soft tissues of the lower cervical spine to the level of C6 (cervical spine MRI sagittal postcontrast image 3). Disc levels: There is disc bulging at T2-T3, asymmetric right resulting in moderate to severe right-sided neural foraminal stenosis. There is ventral and dorsal epidural phlegmon spanning from T2-T4 ventrally and T1-T5 dorsally, measuring up to 2-3 mm and thickness. There is multilevel disc desiccation throughout the thoracic spine, with additional mild disc bulging at T3-T4 resulting in mild bilateral neural foraminal stenosis and minimal disc bulging at T10-T11 and T11-T12 resulting in no significant stenosis. 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 There is also adjacent dorsal and ventral epidural phlegmon measuring up to 2-3 mm in thickness, spanning from T2-T4 ventrally and T1-T5 dorsally. In  addition, there is probable septic arthritis of the right C2-C3 facet joint with adjacent facet osteomyelitis and right paraspinal soft tissue inflammation at this level. Right sided ventral and lateral epidural phlegmon at C2-C3 measuring up to 2 mm in thickness. Degenerative changes in the cervical and thoracic spine as described above. Electronically Signed   By: Caprice Renshaw M.D.   On: 10/10/2021 22:02   MR THORACIC SPINE W WO CONTRAST  Result Date: 10/10/2021 CLINICAL DATA:  Myelopathy, acute, cervical spine; Myelopathy, acute, thoracic spine EXAM: MRI CERVICAL AND THORACIC SPINE WITHOUT AND WITH CONTRAST TECHNIQUE: Multiplanar and multiecho pulse sequences of the cervical spine, to include  the craniocervical junction and cervicothoracic junction, and the thoracic spine, were obtained without and with intravenous contrast. CONTRAST:  7.54mL GADAVIST GADOBUTROL 1 MMOL/ML IV SOLN COMPARISON:  Same day CT chest FINDINGS: MRI CERVICAL SPINE FINDINGS Motion degraded exam. Alignment: Trace anterolisthesis at C2-C3. Vertebrae: No evidence of acute fracture or aggressive osseous lesion. Cord: Normal signal and morphology. Posterior Fossa, vertebral arteries, paraspinal tissues: Preserved vertebral artery flow voids. The posterior fossa is unremarkable. There is soft tissue edema and enhancement along the right posterior paraspinal soft tissues adjacent to the right C2-C3 facet. There is paraspinal soft tissue edema along the upper thoracic spine extending into the anterior lower aspect of the cervical spine to level of C6, described below. Disc levels: The craniocervical junction is unremarkable. C2-C3: Trace degenerative anterolisthesis. Advanced right-sided facet arthropathy with cortical irregularity, periarticular marrow edema and enhancement, and a right-sided facet effusion. Mild bilateral neural foraminal stenosis.Minimal spinal canal narrowing. Right sided ventral and lateral epidural phlegmon measuring up to  2 mm in thicknes C3-C4: Minimal disc bulging. Minimal spinal canal narrowing. Mild bilateral facet arthropathy.Moderate left neural foraminal stenosis. No right neural foraminal stenosis. C4-C5: Asymmetric right disc bulging. Mild bilateral facet arthropathy. Minimal canal narrowing. Mild right neural foraminal stenosis. No left neural foraminal stenosis. C5-C6: Posterior disc osteophyte complex and uncovertebral joint hypertrophy. Mild spinal canal stenosis. Moderate bilateral neural foraminal stenosis, left greater than right. C6-C7: Posterior disc osteophyte complex. Minimal canal narrowing. Mild bilateral neural foraminal stenosis. C7-T1: No significant spinal canal or neural foraminal narrowing. MRI THORACIC SPINE FINDINGS Alignment:  Physiologic. Vertebrae: There is endplate irregularity, marrow edema and enhancement at T2-T3 with minimally increased abnormal signal in the intervertebral disc. Focal T1 and T2 hyperintense lesion within the posterior aspect of the T10 vertebral body on the left near the pedicle with central signal dropout on STIR likely an atypical hemangioma. Scattered additional degenerative endplate changes. Cord: No definite abnormal cord signal. Paraspinal and other soft tissues: There is extensive anterior and lateral paraspinal soft tissue edema and enhancement spanning T2 through T6 consistent with phlegmon. There is thin phlegmonous extension into the anterior paraspinal soft tissues of the lower cervical spine to the level of C6 (cervical spine MRI sagittal postcontrast image 3). Disc levels: There is disc bulging at T2-T3, asymmetric right resulting in moderate to severe right-sided neural foraminal stenosis. There is ventral and dorsal epidural phlegmon spanning from T2-T4 ventrally and T1-T5 dorsally, measuring up to 2-3 mm and thickness. There is multilevel disc desiccation throughout the thoracic spine, with additional mild disc bulging at T3-T4 resulting in mild bilateral neural  foraminal stenosis and minimal disc bulging at T10-T11 and T11-T12 resulting in no significant stenosis. 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 There is also adjacent dorsal and ventral epidural phlegmon measuring up to 2-3 mm in thickness, spanning from T2-T4 ventrally and T1-T5 dorsally. 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 Right sided ventral and lateral epidural phlegmon at C2-C3 measuring up to 2 mm in thickness. Degenerative changes in the cervical and thoracic spine as described above. Electronically Signed   By: Caprice Renshaw M.D.   On: 10/10/2021 22:02   ECHOCARDIOGRAM COMPLETE  Result Date: 10/11/2021    ECHOCARDIOGRAM REPORT   Patient Name:   Coron  Bruun Date of Exam: 10/11/2021 Medical Rec #:  [id number]   Height:       67.0 in Accession #:    [id number]  Weight:       175.0 lb Date of Birth:  12/15/1975    BSA:          1.911 m Patient Age:    46 years    BP:           111/67 mmHg Patient Gender: M           HR:           72 bpm. Exam Location:  [facility] Procedure: 2D Echo, Cardiac Doppler and Color Doppler Indications:     Bacteremia R78.81  History:         Patient has no prior history of Echocardiogram examinations.                  Hypokalemia , History of IV drug abuse.  Sonographer:     Cristela BlueJerry Hege Referring Phys:  [id number] Sunnie NielsenNATALIE Hanifa Antonetti Diagnosing Phys: Julien Nordmannimothy Gollan MD  Sonographer Comments: Suboptimal apical window. IMPRESSIONS  1. Left ventricular ejection fraction, by estimation, is 60 to 65%. The left ventricle has normal function. The left ventricle has no regional wall motion abnormalities. Left ventricular diastolic parameters were normal.  2. Right ventricular systolic function is  normal. The right ventricular size is normal. There is normal pulmonary artery systolic pressure. The estimated right ventricular systolic pressure is 17.8 mmHg.  3. The mitral valve is normal in structure. Mild mitral valve regurgitation. No evidence of mitral stenosis.  4. The aortic valve is normal in structure. Aortic valve regurgitation is not visualized. Aortic valve sclerosis is present, with no evidence of aortic valve stenosis.  5. The inferior vena cava is normal in size with greater than 50% respiratory variability, suggesting right atrial pressure of 3 mmHg.  6. No valve vegetation noted. FINDINGS  Left Ventricle: Left ventricular ejection fraction, by estimation, is 60 to 65%. The left ventricle has normal function. The left ventricle has no regional wall motion abnormalities. The left ventricular internal cavity size was normal in size. There is  no left ventricular hypertrophy. Left ventricular diastolic parameters were normal. Right Ventricle: The right ventricular size is normal. No increase in right ventricular wall thickness. Right ventricular systolic function is normal. There is normal pulmonary artery systolic pressure. The tricuspid regurgitant velocity is 1.79 m/s, and  with an assumed right atrial pressure of 5 mmHg, the estimated right ventricular systolic pressure is 17.8 mmHg. Left Atrium: Left atrial size was normal in size. Right Atrium: Right atrial size was normal in size. Pericardium: There is no evidence of pericardial effusion. Mitral Valve: The mitral valve is normal in structure. Mild mitral valve regurgitation. No evidence of mitral valve stenosis. MV peak gradient, 3.6 mmHg. The mean mitral valve gradient is 2.0 mmHg. Tricuspid Valve: The tricuspid valve is normal in structure. Tricuspid valve regurgitation is mild . No evidence of tricuspid stenosis. Aortic Valve: The aortic valve is normal in structure. Aortic valve regurgitation is not visualized. Aortic valve sclerosis is  present, with no evidence of aortic valve stenosis. Aortic valve mean gradient measures 3.0 mmHg. Aortic valve peak gradient measures 6.0 mmHg. Aortic valve area, by VTI measures 2.26 cm. Pulmonic Valve: The pulmonic valve was normal in structure. Pulmonic valve regurgitation is not visualized. No evidence of pulmonic stenosis. Aorta: The aortic root is normal in size and structure. Venous: The inferior vena  cava is normal in size with greater than 50% respiratory variability, suggesting right atrial pressure of 3 mmHg. IAS/Shunts: No atrial level shunt detected by color flow Doppler.  LEFT VENTRICLE PLAX 2D LVIDd:         4.70 cm   Diastology LVIDs:         2.80 cm   LV e' medial:    9.25 cm/s LV PW:         0.90 cm   LV E/e' medial:  8.0 LV IVS:        0.70 cm   LV e' lateral:   16.50 cm/s LVOT diam:     2.00 cm   LV E/e' lateral: 4.5 LV SV:         42 LV SV Index:   22 LVOT Area:     3.14 cm  RIGHT VENTRICLE RV Basal diam:  3.90 cm RV S prime:     12.90 cm/s TAPSE (M-mode): 2.3 cm LEFT ATRIUM             Index        RIGHT ATRIUM           Index LA diam:        3.50 cm 1.83 cm/m   RA Area:     16.60 cm LA Vol (A2C):   24.4 ml 12.77 ml/m  RA Volume:   45.30 ml  23.71 ml/m LA Vol (A4C):   36.7 ml 19.21 ml/m LA Biplane Vol: 30.0 ml 15.70 ml/m  AORTIC VALVE                    PULMONIC VALVE AV Area (Vmax):    2.30 cm     PV Vmax:        1.02 m/s AV Area (Vmean):   2.10 cm     PV Vmean:       68.800 cm/s AV Area (VTI):     2.26 cm     PV VTI:         0.175 m AV Vmax:           122.00 cm/s  PV Peak grad:   4.2 mmHg AV Vmean:          82.100 cm/s  PV Mean grad:   2.0 mmHg AV VTI:            0.186 m      RVOT Peak grad: 5 mmHg AV Peak Grad:      6.0 mmHg AV Mean Grad:      3.0 mmHg LVOT Vmax:         89.40 cm/s LVOT Vmean:        54.800 cm/s LVOT VTI:          0.134 m LVOT/AV VTI ratio: 0.72  AORTA Ao Root diam: 3.60 cm MITRAL VALVE               TRICUSPID VALVE MV Area (PHT): 4.41 cm    TR Peak grad:   12.8  mmHg MV Area VTI:   1.97 cm    TR Vmax:        179.00 cm/s MV Peak grad:  3.6 mmHg MV Mean grad:  2.0 mmHg    SHUNTS MV Vmax:       0.95 m/s    Systemic VTI:  0.13 m MV Vmean:      64.5 cm/s   Systemic Diam: 2.00 cm MV Decel Time: 172 msec  Pulmonic VTI:  0.147 m MV E velocity: 74.10 cm/s MV A velocity: 56.60 cm/s MV E/A ratio:  1.31 Julien Nordmann MD Electronically signed by Julien Nordmann MD Signature Date/Time: 10/11/2021/2:55:05 PM    Final             LOS: 4 days    Time spent: 35 min    Sunnie Nielsen, DO [facility] 10/14/2021, 10:01 AM   Staff may message me via secure chat in Epic  but this may not receive immediate response,  please page for urgent matters!  If 7PM-7AM, please contact night-coverage [email or web address]  Dictation software was used to generate the above note. Typos may occur and escape review, as with typed/written notes. Please contact Dr Lyn Hollingshead directly for clarity if needed.

## 2021-10-14 NOTE — Progress Notes (Signed)
Pharmacy Antibiotic Note  Cameron Hammond is a 46 y.o. male admitted on 10/10/2021 with sepsis d/t MRSA bacteremia.  Pharmacy has been consulted for vancomycin dosing.  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  Levels were drawn at steady-state (1250 mg q12h):   vancomycin peak 05/28 1819: 28 mcg/mL  vancomycin trough 05/29 0246 = 8 mcg/mL  Plan: adjust vancomycin dose to 1000 mg IV every 8 hours  Projected AUC:  460.2 Css: 30.5 / 10.8 mcg/mL Ke: 0.1483 T1/2 6.7 h  --recheck levels at steady-state  Height: 5\' 7"  (170.2 cm) Weight: 79.4 kg (175 lb) IBW/kg (Calculated) : 66.1  Temp (24hrs), Avg:99.1 F (37.3 C), Min:98.5 F (36.9 C), Max:99.4 F (37.4 C)  Recent Labs  Lab 10/10/21 1304 10/10/21 1529 10/11/21 0554 10/12/21 0423 10/13/21 1819 10/14/21 0246  WBC 17.3*  --  15.4* 13.0*  --  11.7*  CREATININE 0.79  --   --  0.68  --  0.67  LATICACIDVEN  --  1.2  --   --   --   --   VANCOTROUGH  --   --   --   --   --  8*  VANCOPEAK  --   --   --   --  28*  --      Estimated Creatinine Clearance: 117.8 mL/min (by C-G formula based on SCr of 0.67 mg/dL).    No Known Allergies  Antimicrobials this admission: Zosyn 5/25>>5/25 vancomycin 5/25 >>  Microbiology results: 5/25 Bcx 4of4 bottles>> MRSA by BCID 5/28 Bcx 2/4 GPC  MRI : Discitis-osteomyelitis at T2-T3,  with extensive adjacent anterior and lateral paraspinal phlegmon  Thank you for allowing pharmacy to be a part of this patient's care.  6/28 10/14/2021 1:08 PM

## 2021-10-14 NOTE — Progress Notes (Signed)
Pharmacy Antibiotic Note  Cameron Hammond is a 46 y.o. male admitted on 10/10/2021 with sepsis d/t MRSA bacteremia.  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   Plan 10/14/21 : Will transition from Vancomycin to Daptomycin starting 5/30 AM Discontinue Vancomycin 1000 mg Q8H, last dose 5/30 0000 Initiate Daptomycin 10 mg/kg (TBW) Q24H 5/30  Baseline CK ordered for tomorrow AM   Height: 5\' 7"  (170.2 cm) Weight: 79.4 kg (175 lb) IBW/kg (Calculated) : 66.1  Temp (24hrs), Avg:99.1 F (37.3 C), Min:98.4 F (36.9 C), Max:99.4 F (37.4 C)  Recent Labs  Lab 10/10/21 1304 10/10/21 1529 10/11/21 0554 10/12/21 0423 10/13/21 1819 10/14/21 0246  WBC 17.3*  --  15.4* 13.0*  --  11.7*  CREATININE 0.79  --   --  0.68  --  0.67  LATICACIDVEN  --  1.2  --   --   --   --   VANCOTROUGH  --   --   --   --   --  8*  VANCOPEAK  --   --   --   --  28*  --      Estimated Creatinine Clearance: 117.8 mL/min (by C-G formula based on SCr of 0.67 mg/dL).    No Known Allergies  Antimicrobials this admission: Zosyn 5/25>>5/25 vancomycin 5/25 >> 5/29 5/30 Daptomycin >>   Microbiology results: 5/25 Bcx 4of4 bottles>> MRSA by BCID 5/28 Bcx 2/4 GPC  MRI : Discitis-osteomyelitis at T2-T3,  with extensive adjacent anterior and lateral paraspinal phlegmon  Thank you for allowing pharmacy to be a part of this patient's care.  6/28, PharmD, BCPS Clinical Pharmacist   10/14/2021 8:46 PM

## 2021-10-14 NOTE — Progress Notes (Addendum)
Pharmacy Antibiotic Note  Cameron Hammond is a 46 y.o. male admitted on 10/10/2021 with sepsis.  Pharmacy has been consulted for Vancomycin dosing.  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 Vancomycin 1250 mg IV Q12H ordered to start on 5/26 @ 0400.  AUC = 460.9 Vanc trough = 11.9   5/28:  Vanc peak @ 1819 = 28  5/29:  Vanc trough @ 0246 = 8   Calculated AUC = 399.2  Will adjust dose to Vancomycin 1500 mg IV Q12H to start on 5/29 @ 0400.  Projected AUC:  478.2  Will draw next Vanc peak and trough around 3rd new dose on 5/30 @ 0400.   Vanc peak on 5/30 @ 0700 Vanc trough on 5/30 @ 1530  F/u renal fxn    Height: 5\' 7"  (170.2 cm) Weight: 79.4 kg (175 lb) IBW/kg (Calculated) : 66.1  Temp (24hrs), Avg:99 F (37.2 C), Min:98.5 F (36.9 C), Max:99.4 F (37.4 C)  Recent Labs  Lab 10/10/21 1304 10/10/21 1529 10/11/21 0554 10/12/21 0423 10/13/21 1819 10/14/21 0246  WBC 17.3*  --  15.4* 13.0*  --  11.7*  CREATININE 0.79  --   --  0.68  --  0.67  LATICACIDVEN  --  1.2  --   --   --   --   VANCOTROUGH  --   --   --   --   --  8*  VANCOPEAK  --   --   --   --  28*  --      Estimated Creatinine Clearance: 117.8 mL/min (by C-G formula based on SCr of 0.67 mg/dL).    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  Aribelle Mccosh D 10/14/2021 3:45 AM

## 2021-10-14 NOTE — Progress Notes (Signed)
   Date of Admission:  10/10/2021     ID: Cameron Hammond is a 45 y.o. 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 Active Problems:   History of intravenous drug abuse (HCC)   Hypokalemia   Septic arthritis of cervical spine (HCC)    Subjective: Patient is complaining of pain upper back in the mid spine radiating to the neck and to the back of his head and lower back No fever   Medications:   enoxaparin (LOVENOX) injection  40 mg Subcutaneous Q24H   HYDROcodone-acetaminophen  1 tablet Oral Q6H   orphenadrine  60 mg Intravenous Q12H    Objective: Vital signs in last 24 hours: Temp:  [98.5 F (36.9 C)-99.4 F (37.4 C)] 99.3 F (37.4 C) (05/29 1611) Pulse Rate:  [78-93] 78 (05/29 1611) Resp:  [16-18] 18 (05/29 1611) BP: (114-133)/(70-79) 116/70 (05/29 1611) SpO2:  [96 %-100 %] 100 % (05/29 1611)   PHYSICAL EXAM:  General: Alert, cooperative, no distress, appears stated age.  Head: Normocephalic, without obvious abnormality, atraumatic. Eyes: Conjunctivae clear, anicteric sclerae. Pupils are equal ENT Nares normal. No drainage or sinus tenderness. Lips, mucosa, and tongue normal. No Thrush Neck: Supple, symmetrical, no adenopathy, thyroid: non tender no carotid bruit and no JVD. Back: No CVA tenderness. Lungs: Clear to auscultation bilaterally. No Wheezing or Rhonchi. No rales. Heart: Regular rate and rhythm, no murmur, rub or gallop. Abdomen: Soft, non-tender,not distended. Bowel sounds normal. No masses Extremities: atraumatic, no cyanosis. No edema. No clubbing Skin: No rashes or lesions. Or bruising Lymph: Cervical, supraclavicular normal. Neurologic: Grossly non-focal  Lab Results Recent Labs    10/12/21 0423 10/14/21 0246  WBC 13.0* 11.7*  HGB 12.3* 10.7*  HCT 36.1* 31.4*  NA 135 136  K 3.2* 2.9*  CL 98 101  CO2 26 26  BUN 16 11  CREATININE 0.68 0.67     Microbiology: 10/10/2021 blood culture  MRSA 10/05/2021 4 out of 4 MRSA  Studies/Results:   Discitis/osteomyelitis at T2-T3 with extensive adjacent anterior and lateral paraspinal phlegmon extending superiorly to the lower cervical spine at the level of C6 and inferiorly to the level of T6.  In addition there is possible septic arthritis of the right C2-C3 facet joint with adjacent facet osteomyelitis and right paraspinal soft tissue inflammation.  Assessment/Plan: MRSA bacteremia with thoracic vertebral and cervical vertebral infection.  There is discitis osteomyelitis at T2-T3 level with extensive paraspinal phlegmon extending from C6 until T6.  Discussed with neurosurgeon.  As epidural phlegmon very minimal surgery is not needed IR to see whether the paraspinal phlegmon could be drained t. Persistent bacteremia. We will change vancomycin to daptomycin.  But antibiotics alone may not be sufficient as we need to decrease the bioburden by either IR drainage or surgery.  Discussed the management with the patient and his mother on the phone and the hospitalist and the neurosurgeon.

## 2021-10-15 ENCOUNTER — Inpatient Hospital Stay: Payer: Self-pay

## 2021-10-15 DIAGNOSIS — M4644 Discitis, unspecified, thoracic region: Secondary | ICD-10-CM

## 2021-10-15 DIAGNOSIS — M4620 Osteomyelitis of vertebra, site unspecified: Secondary | ICD-10-CM

## 2021-10-15 LAB — CREATININE, SERUM
Creatinine, Ser: 0.54 mg/dL — ABNORMAL LOW (ref 0.61–1.24)
GFR, Estimated: >60 mL/min (ref >60)

## 2021-10-15 LAB — BASIC METABOLIC PANEL
Anion gap: 14 (ref 5–15)
BUN: 19 mg/dL (ref 6–20)
CO2: 30 mmol/L (ref 22–32)
Calcium: 9.5 mg/dL (ref 8.9–10.3)
Chloride: 96 mmol/L — ABNORMAL LOW (ref 98–111)
Creatinine, Ser: 0.82 mg/dL (ref 0.61–1.24)
GFR, Estimated: >60 mL/min (ref >60)
Glucose, Bld: 118 mg/dL — ABNORMAL HIGH (ref 70–99)
Potassium: 3.8 mmol/L (ref 3.5–5.1)
Sodium: 140 mmol/L (ref 135–145)

## 2021-10-15 LAB — CK: Total CK: 45 U/L — ABNORMAL LOW (ref 49–397)

## 2021-10-15 LAB — CBC
HCT: 35.6 % — ABNORMAL LOW (ref 39.0–52.0)
Hemoglobin: 11.9 g/dL — ABNORMAL LOW (ref 13.0–17.0)
MCH: 29.8 pg (ref 26.0–34.0)
MCHC: 33.4 g/dL (ref 30.0–36.0)
MCV: 89 fL (ref 80.0–100.0)
Platelets: 574 10*3/uL — ABNORMAL HIGH (ref 150–400)
RBC: 4 MIL/uL — ABNORMAL LOW (ref 4.22–5.81)
RDW: 12.9 % (ref 11.5–15.5)
WBC: 13.3 10*3/uL — ABNORMAL HIGH (ref 4.0–10.5)
nRBC: 0 % (ref 0.0–0.2)

## 2021-10-15 LAB — C-REACTIVE PROTEIN: CRP: 20.4 mg/dL — ABNORMAL HIGH (ref <1.0)

## 2021-10-15 MED ORDER — FENTANYL CITRATE (PF) 100 MCG/2ML IJ SOLN
100.0000 ug | Freq: Once | INTRAMUSCULAR | Status: AC
Start: 2021-10-15 — End: 2021-10-15
  Administered 2021-10-15: 100 ug via INTRAVENOUS
  Filled 2021-10-15: qty 2

## 2021-10-15 MED ORDER — KETOROLAC TROMETHAMINE 30 MG/ML IJ SOLN
30.0000 mg | Freq: Four times a day (QID) | INTRAMUSCULAR | Status: DC
  Administered 2021-10-15 – 2021-10-16 (×3): 30 mg via INTRAVENOUS
  Filled 2021-10-15 (×3): qty 1

## 2021-10-15 MED ORDER — LIDOCAINE 5 % EX PTCH
2.0000 | MEDICATED_PATCH | CUTANEOUS | Status: DC
  Administered 2021-10-15 – 2021-10-16 (×2): 2 via TRANSDERMAL
  Filled 2021-10-15 (×9): qty 2

## 2021-10-15 NOTE — Progress Notes (Addendum)
1800 of fentanyl admin through IV at this time   1802 VSS pt still in a lot of pain. Another IV fentanyl given   1804 Pt still reviving in pain VSS stable another fentanyl given via IV   1807 VSS pt states some relief last fentanyl IV given at this time. Pt still moaning. Nurse will place lidocaine patches to back as well

## 2021-10-15 NOTE — Progress Notes (Signed)
PROGRESS NOTE    Cameron Hammond  WJX:[id number] DOB: 06/10/1975  DOA: 10/10/2021 Date of Service: 10/15/21 PCP: Patient, No Pcp Per (Inactive)     Brief Narrative / Hospital Course:  Mr. Cameron Hammond is a 46 year old male PMH back pain, IVDU last use 6 months prior to admission (varying history on this).  Presented to ED 10/10/21 complaining of pain in head, neck, upper back x1 week, no relief with chiropractor. 05/25: In ED CT head/cervical spine and CXR negative, CTA chest no PE, paraspinal soft tissue thickening extending from T2-T5 concerning for osteomyelitis.  WBC 17.3.  Started on vancomycin, Zosyn.  Vital signs initially normal, later transient tachycardia/tachypnea, presumably due to pain, requiring IV Dilaudid for pain control.   MRI thoracic and cervical spine: Discitis/osteomyelitis at 2 to T3, extensive adjacent anterior and lateral paraspinal phlegmon extending to C6 and to T6, dorsal and ventral phlegmon T2-T4 ventrally and T1-T5 dorsally, probable septic arthritis of the right C2-C3 facet joint with adjacent facet osteomyelitis and right paraspinal soft tissue inflammation, right sided ventral and lateral phlegmon C2-C3.   MRSA on blood culture 3 of 4 bottles, Zosyn was d/c, vancomycin was continued. 05/26: WBC trending down to 15.4. ID advising neurosurgery consult to eval for washout/procedure to reduce bioburden. TEE may not be needed since will not change therapy of at least 6 weeks abx IV. Repeat BCx in 48 hours to eval for clearance. Neurosurgery reviewed and communicated w/ me via secure chat, no plan at this point for surgical intervention.  05/27: pt reporting pain, see nurse notes from overnight. Otherwise remains of IV abx, ID to peripherally follow through the weekend (today is Saturday, Monday is Memorial Day). UTox (+)opiates (from inpatient administration meds), cannabinoids, tricyclics.  05/28: pain better on scheduled Norco 10 q6h, repeat cultures pending 05/29: (+) repeat  blood cultures, ID following, changed abx to daptomycin. I have asked neurosurgery to reevaluate (Dr. Katrinka BlazingSmith) 05/30: No plans for NS or IR to go in and wash/debride. Contineu IV antibitoics per ID and repeat cultures per ID. 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  ID consult: Continue abx for now, repeat BCx pending results. Patient will require long-term antibiotic treatment 6 weeks, however w/ history of IVDU would be reluctant to discharge with venous access, appreciate recs regarding outpatient abx plan on eventual discharge.  Neurosurgical input via secure chat 05/26: MRI no discrete abscess, and no neurologic deficits to warrant emergent intervention, will continue neurologic checks q shift and prn, neurosurgery advises no intervention / no surgical procedure at this time since unlikely to drain anything, if needed could consider IR to sample  Pain control with toradol and oxycodone scheduled, will work on tapering down/off opiates, dilaudid IV for breakthrough  but have instructed RN to avoid giving this if possible  Repeat (+)blood cultures 10/13/21, ID following, have asked  neurosurgery to reevaluate (secure chat to [name] 05/29)  Septic arthritis of cervical spine (HCC) Management as above  History of intravenous drug abuse (HCC) States last use was six months ago Counseled on abstinence  Hypokalemia Oral repletion and monitor    DVT prophylaxis: lovenox Code Status: FULL Family Communication: none at this time Disposition Plan / TOC needs: remains inpatient for now for IV abx and close monitoring neurological status, no TOC needs at this time. Await ID recs re: outpatient abx plan, may need repeat imaging to reassess for procedure to wash out / reduce bioburden  Barriers to discharge / significant pending items: see above             Objective: Vitals:   10/14/21 1611 10/14/21 2044 10/15/21 0503 10/15/21 0828  BP: 116/70 119/66 105/67 102/62  Pulse: 78 61 61 61  Resp: 18 19 18    Temp: 99.3 F (37.4 C) 98.4 F (36.9 C) 97.9 F (36.6 C) 98.7 F (37.1 C)  TempSrc:      SpO2: 100% 100% 100% 100%  Weight:      Height:        Intake/Output Summary (Last 24 hours) at 10/15/2021 1346 Last data filed at 10/15/2021 1344 Gross per 24 hour  Intake 1280 ml  Output 400 ml  Net 880 ml    Filed Weights   10/10/21 1152  Weight: 79.4 kg    Examination:  Constitutional:  VS as above General Appearance: alert, well-developed, well-nourished, NAD Eyes: Normal lids and conjunctive, non-icteric sclera Ears, Nose, Mouth, Throat: Normal appearance Neck: No masses, trachea midline Respiratory: Normal respiratory effort Cardiovascular: S1/S2 normal, RRR No lower extremity edema Musculoskeletal:  No clubbing/cyanosis of digits Neurological: No cranial nerve deficit on limited exam Motor and sensation intact and symmetric Psychiatric: Normal judgment/insight Normal mood and affect       Scheduled Medications:   enoxaparin (LOVENOX) injection  40 mg Subcutaneous Q24H   HYDROcodone-acetaminophen  1 tablet Oral Q6H    orphenadrine  60 mg Intravenous Q12H    Continuous Infusions:  DAPTOmycin (CUBICIN)  IV 800 mg (10/15/21 0909)    PRN Medications:  acetaminophen **OR** acetaminophen, cyclobenzaprine, ketorolac, ondansetron **OR** ondansetron (ZOFRAN) IV  Antimicrobials:  Anti-infectives (From admission, onward)    Start     Dose/Rate Route Frequency Ordered Stop   10/15/21 1000  DAPTOmycin (CUBICIN) 800 mg in sodium chloride 0.9 % IVPB        10 mg/kg  79.4 kg 132 mL/hr over 30 Minutes Intravenous Daily 10/14/21 2045     10/14/21 1600  vancomycin (VANCOCIN) IVPB 1000 mg/200 mL premix        1,000 mg 200 mL/hr over 60 Minutes Intravenous Every 8 hours 10/14/21 1318 10/15/21 0057   10/14/21 0400  vancomycin (VANCOREADY) IVPB 1500 mg/300 mL  Status:  Discontinued        1,500 mg 150 mL/hr over 120 Minutes Intravenous Every 12 hours 10/14/21 0331 10/14/21 1318   10/11/21 0400  vancomycin (VANCOREADY) IVPB 1250 mg/250 mL  Status:  Discontinued        1,250 mg 166.7 mL/hr over 90 Minutes Intravenous Every 12 hours 10/10/21 2300 10/14/21 0330   10/10/21 2300  piperacillin-tazobactam (ZOSYN) IVPB 3.375 g  Status:  Discontinued        3.375 g 12.5 mL/hr over 240 Minutes Intravenous Every 8 hours 10/10/21 2258  10/11/21 0612   10/10/21 1530  vancomycin (VANCOREADY) IVPB 1500 mg/300 mL        1,500 mg 150 mL/hr over 120 Minutes Intravenous  Once 10/10/21 1517 10/10/21 1841   10/10/21 1530  piperacillin-tazobactam (ZOSYN) IVPB 3.375 g        3.375 g 100 mL/hr over 30 Minutes Intravenous  Once 10/10/21 1517 10/10/21 1603       Data Reviewed: I have personally reviewed following labs and imaging studies  CBC: Recent Labs  Lab 10/10/21 1304 10/11/21 0554 10/12/21 0423 10/14/21 0246  WBC 17.3* 15.4* 13.0* 11.7*  NEUTROABS 14.8*  --   --   --   HGB 13.7 13.1 12.3* 10.7*  HCT 41.1 38.8* 36.1* 31.4*  MCV 88.8 89.6 87.4 86.5  PLT 287 283 296 398    Basic Metabolic Panel: Recent Labs  Lab  10/10/21 1304 10/12/21 0423 10/14/21 0246 10/15/21 0402  NA 138 135 136  --   K 3.4* 3.2* 2.9*  --   CL 100 98 101  --   CO2 29 26 26   --   GLUCOSE 133* 110* 112*  --   BUN 16 16 11   --   CREATININE 0.79 0.68 0.67 0.54*  CALCIUM 9.1 8.4* 8.4*  --     GFR: Estimated Creatinine Clearance: 117.8 mL/min (A) (by C-G formula based on SCr of 0.54 mg/dL (L)). Liver Function Tests: No results for input(s): AST, ALT, ALKPHOS, BILITOT, PROT, ALBUMIN in the last 168 hours. No results for input(s): LIPASE, AMYLASE in the last 168 hours. No results for input(s): AMMONIA in the last 168 hours. Coagulation Profile: No results for input(s): INR, PROTIME in the last 168 hours. Cardiac Enzymes: Recent Labs  Lab 10/15/21 0402  CKTOTAL 45*   BNP (last 3 results) No results for input(s): PROBNP in the last 8760 hours. HbA1C: No results for input(s): HGBA1C in the last 72 hours. CBG: No results for input(s): GLUCAP in the last 168 hours. Lipid Profile: No results for input(s): CHOL, HDL, LDLCALC, TRIG, CHOLHDL, LDLDIRECT in the last 72 hours. Thyroid Function Tests: No results for input(s): TSH, T4TOTAL, FREET4, T3FREE, THYROIDAB in the last 72 hours. Anemia Panel: No results for input(s): VITAMINB12, FOLATE, FERRITIN, TIBC, IRON, RETICCTPCT in the last 72 hours. Urine analysis:    Component Value Date/Time   COLORURINE AMBER (A) 11/19/2012 1957   APPEARANCEUR CLEAR 11/19/2012 1957   LABSPEC 1.027 11/19/2012 1957   PHURINE 6.5 11/19/2012 1957   GLUCOSEU NEGATIVE 11/19/2012 1957   HGBUR NEGATIVE 11/19/2012 1957   BILIRUBINUR SMALL (A) 11/19/2012 1957   KETONESUR 15 (A) 11/19/2012 1957   PROTEINUR 30 (A) 11/19/2012 1957   UROBILINOGEN 1.0 11/19/2012 1957   NITRITE NEGATIVE 11/19/2012 1957   LEUKOCYTESUR NEGATIVE 11/19/2012 1957   Sepsis Labs: @LABRCNTIP (procalcitonin:4,lacticidven:4)  Recent Results (from the past 240 hour(s))  Blood culture (routine x 2)     Status: Abnormal    Collection Time: 10/10/21  3:19 PM   Specimen: BLOOD LEFT FOREARM  Result Value Ref Range Status   Specimen Description   Final    BLOOD LEFT FOREARM Performed at [facility], [address] [place], [facility] [address]    Special Requests   Final    BOTTLES DRAWN AEROBIC AND ANAEROBIC Blood Culture adequate volume Performed at [facility], [address] [place], [facility] [address]    Culture  Setup Time   Final    Organism ID to follow GRAM POSITIVE COCCI IN BOTH  AEROBIC AND ANAEROBIC BOTTLES CRITICAL RESULT CALLED TO, READ BACK BY AND VERIFIED WITH: JASON ROBBINS AT 1610 10/11/21.PMF Performed at [facility], [address] [place], [place] [address]    Culture METHICILLIN RESISTANT STAPHYLOCOCCUS AUREUS (A)  Final   Report Status 10/13/2021 FINAL  Final   Organism ID, Bacteria METHICILLIN RESISTANT STAPHYLOCOCCUS AUREUS  Final      Susceptibility   Methicillin resistant staphylococcus aureus - MIC*    CIPROFLOXACIN <=0.5 SENSITIVE Sensitive     ERYTHROMYCIN >=8 RESISTANT Resistant     GENTAMICIN <=0.5 SENSITIVE Sensitive     OXACILLIN >=4 RESISTANT Resistant     TETRACYCLINE <=1 SENSITIVE Sensitive     VANCOMYCIN <=0.5 SENSITIVE Sensitive     TRIMETH/SULFA <=10 SENSITIVE Sensitive     CLINDAMYCIN <=0.25 SENSITIVE Sensitive     RIFAMPIN <=0.5 SENSITIVE Sensitive     Inducible Clindamycin NEGATIVE Sensitive     * METHICILLIN RESISTANT STAPHYLOCOCCUS AUREUS  Blood Culture ID Panel (Reflexed)     Status: Abnormal   Collection Time: 10/10/21  3:19 PM  Result Value Ref Range Status   Enterococcus faecalis NOT DETECTED NOT DETECTED Final   Enterococcus Faecium NOT DETECTED NOT DETECTED Final   Listeria monocytogenes NOT DETECTED NOT DETECTED Final   Staphylococcus species DETECTED (A) NOT DETECTED Final    Comment: CRITICAL RESULT CALLED TO, READ BACK BY AND VERIFIED WITH: JASON ROBBINS AT 0608 10/11/21.PMF    Staphylococcus aureus (BCID)  DETECTED (A) NOT DETECTED Final    Comment: Methicillin (oxacillin)-resistant Staphylococcus aureus (MRSA). MRSA is predictably resistant to beta-lactam antibiotics (except ceftaroline). Preferred therapy is vancomycin unless clinically contraindicated. Patient requires contact precautions if  hospitalized. CRITICAL RESULT CALLED TO, READ BACK BY AND VERIFIED WITH: JASON ROBBINS AT 4098 10/11/21.PMF    Staphylococcus epidermidis NOT DETECTED NOT DETECTED Final   Staphylococcus lugdunensis NOT DETECTED NOT DETECTED Final   Streptococcus species NOT DETECTED NOT DETECTED Final   Streptococcus agalactiae NOT DETECTED NOT DETECTED Final   Streptococcus pneumoniae NOT DETECTED NOT DETECTED Final   Streptococcus pyogenes NOT DETECTED NOT DETECTED Final   A.calcoaceticus-baumannii NOT DETECTED NOT DETECTED Final   Bacteroides fragilis NOT DETECTED NOT DETECTED Final   Enterobacterales NOT DETECTED NOT DETECTED Final   Enterobacter cloacae complex NOT DETECTED NOT DETECTED Final   Escherichia coli NOT DETECTED NOT DETECTED Final   Klebsiella aerogenes NOT DETECTED NOT DETECTED Final   Klebsiella oxytoca NOT DETECTED NOT DETECTED Final   Klebsiella pneumoniae NOT DETECTED NOT DETECTED Final   Proteus species NOT DETECTED NOT DETECTED Final   Salmonella species NOT DETECTED NOT DETECTED Final   Serratia marcescens NOT DETECTED NOT DETECTED Final   Haemophilus influenzae NOT DETECTED NOT DETECTED Final   Neisseria meningitidis NOT DETECTED NOT DETECTED Final   Pseudomonas aeruginosa NOT DETECTED NOT DETECTED Final   Stenotrophomonas maltophilia NOT DETECTED NOT DETECTED Final   Candida albicans NOT DETECTED NOT DETECTED Final   Candida auris NOT DETECTED NOT DETECTED Final   Candida glabrata NOT DETECTED NOT DETECTED Final   Candida krusei NOT DETECTED NOT DETECTED Final   Candida parapsilosis NOT DETECTED NOT DETECTED Final   Candida tropicalis NOT DETECTED NOT DETECTED Final   Cryptococcus  neoformans/gattii NOT DETECTED NOT DETECTED Final   Meth resistant mecA/C and MREJ DETECTED (A) NOT DETECTED Final    Comment: CRITICAL RESULT CALLED TO, READ BACK BY AND VERIFIED WITH: JASON ROBBINS AT 0608 10/11/21.PMF Performed at [facility], [address], [place], [place] [address]  Blood culture (routine x 2)     Status: Abnormal   Collection Time: 10/10/21  3:24 PM   Specimen: Right Antecubital; Blood  Result Value Ref Range Status   Specimen Description   Final    RIGHT ANTECUBITAL Performed at [facility], [address], [place], [place] [address]    Special Requests   Final    BOTTLES DRAWN AEROBIC AND ANAEROBIC Blood Culture adequate volume Performed at [facility], [address] [place], [place] [address]    Culture  Setup Time   Final    GRAM POSITIVE COCCI IN BOTH AEROBIC AND ANAEROBIC BOTTLES CRITICAL RESULT CALLED TO, READ BACK BY AND VERIFIED WITH: JASON ROBBINS AT 0630 10/11/21.PMF Performed at [facility], [address] [place], [place] [address]    Culture (A)  Final    STAPHYLOCOCCUS AUREUS SUSCEPTIBILITIES PERFORMED ON PREVIOUS CULTURE WITHIN THE LAST 5 DAYS. Performed at [facility], [address], [place], [place] [address]    Report Status 10/13/2021 FINAL  Final  Culture, blood (Routine X 2) w Reflex to ID Panel     Status: Abnormal (Preliminary result)   Collection Time: 10/13/21  4:31 AM   Specimen: BLOOD  Result Value Ref Range Status   Specimen Description   Final    BLOOD LEFT ANTECUBITAL Performed at [facility], [address], [place], [place] [address]    Special Requests   Final    BOTTLES DRAWN AEROBIC AND ANAEROBIC Blood Culture adequate volume Performed at [facility], [address] [place], [place] [address]    Culture  Setup Time   Final    GRAM POSITIVE COCCI IN BOTH AEROBIC AND ANAEROBIC BOTTLES CRITICAL RESULT CALLED TO, READ BACK BY AND VERIFIED  WITH: JASON ROBINS@ 2336 10/13/21 LFD Performed at [facility], [address] [place], [place] [address]    Culture STAPHYLOCOCCUS AUREUS (A)  Final   Report Status PENDING  Incomplete  Culture, blood (Routine X 2) w Reflex to ID Panel     Status: Abnormal (Preliminary result)   Collection Time: 10/13/21  4:32 AM   Specimen: BLOOD  Result Value Ref Range Status   Specimen Description   Final    BLOOD RIGHT ANTECUBITAL Performed at [facility], [address] [place], [place] [address]    Special Requests   Final    BOTTLES DRAWN AEROBIC AND ANAEROBIC Blood Culture adequate volume Performed at [facility], [address] [place], [place] [address]    Culture  Setup Time   Final    GRAM POSITIVE COCCI ANAEROBIC BOTTLE ONLY CRITICAL RESULT CALLED TO, READ BACK BY AND VERIFIED WITH: JASON ROBINS @ 2236 10/13/21 LFD    Culture (A)  Final    STAPHYLOCOCCUS AUREUS SUSCEPTIBILITIES TO FOLLOW Performed at [facility], [address], [place], [place] [address]    Report Status PENDING  Incomplete  Blood Culture ID Panel (Reflexed)     Status: Abnormal   Collection Time: 10/13/21  4:32 AM  Result Value Ref Range Status   Enterococcus faecalis NOT DETECTED NOT DETECTED Final   Enterococcus Faecium NOT DETECTED NOT DETECTED Final   Listeria monocytogenes NOT DETECTED NOT DETECTED Final   Staphylococcus species DETECTED (A) NOT DETECTED Final    Comment: CRITICAL RESULT CALLED TO, READ BACK BY AND VERIFIED WITH: JASON ROBINS@ 2236 10/13/21 LFD    Staphylococcus aureus (BCID) DETECTED (A) NOT DETECTED Final  Comment: Methicillin (oxacillin)-resistant Staphylococcus aureus (MRSA). MRSA is predictably resistant to beta-lactam antibiotics (except ceftaroline). Preferred therapy is vancomycin unless clinically contraindicated. Patient requires contact precautions if  hospitalized. CRITICAL RESULT CALLED TO, READ BACK BY AND VERIFIED WITH: JASON  ROBINS@ 2236 10/13/21 LFD    Staphylococcus epidermidis NOT DETECTED NOT DETECTED Final   Staphylococcus lugdunensis NOT DETECTED NOT DETECTED Final   Streptococcus species NOT DETECTED NOT DETECTED Final   Streptococcus agalactiae NOT DETECTED NOT DETECTED Final   Streptococcus pneumoniae NOT DETECTED NOT DETECTED Final   Streptococcus pyogenes NOT DETECTED NOT DETECTED Final   A.calcoaceticus-baumannii NOT DETECTED NOT DETECTED Final   Bacteroides fragilis NOT DETECTED NOT DETECTED Final   Enterobacterales NOT DETECTED NOT DETECTED Final   Enterobacter cloacae complex NOT DETECTED NOT DETECTED Final   Escherichia coli NOT DETECTED NOT DETECTED Final   Klebsiella aerogenes NOT DETECTED NOT DETECTED Final   Klebsiella oxytoca NOT DETECTED NOT DETECTED Final   Klebsiella pneumoniae NOT DETECTED NOT DETECTED Final   Proteus species NOT DETECTED NOT DETECTED Final   Salmonella species NOT DETECTED NOT DETECTED Final   Serratia marcescens NOT DETECTED NOT DETECTED Final   Haemophilus influenzae NOT DETECTED NOT DETECTED Final   Neisseria meningitidis NOT DETECTED NOT DETECTED Final   Pseudomonas aeruginosa NOT DETECTED NOT DETECTED Final   Stenotrophomonas maltophilia NOT DETECTED NOT DETECTED Final   Candida albicans NOT DETECTED NOT DETECTED Final   Candida auris NOT DETECTED NOT DETECTED Final   Candida glabrata NOT DETECTED NOT DETECTED Final   Candida krusei NOT DETECTED NOT DETECTED Final   Candida parapsilosis NOT DETECTED NOT DETECTED Final   Candida tropicalis NOT DETECTED NOT DETECTED Final   Cryptococcus neoformans/gattii NOT DETECTED NOT DETECTED Final   Meth resistant mecA/C and MREJ DETECTED (A) NOT DETECTED Final    Comment: CRITICAL RESULT CALLED TO, READ BACK BY AND VERIFIED WITH: JASON ROBINS @ 2236 10/13/21 LFD Performed at [facility], [address] [place], [place] [address]          Radiology Studies last 96 hours: No results  found.          LOS: 5 days    Time spent: 35 min    Sunnie Nielsen, DO [facility] 10/15/2021, 1:46 PM   Staff may message me via secure chat in Epic  but this may not receive immediate response,  please page for urgent matters!  If 7PM-7AM, please contact night-coverage [email or web address]  Dictation software was used to generate the above note. Typos may occur and escape review, as with typed/written notes. Please contact Dr Lyn Hollingshead directly for clarity if needed.

## 2021-10-15 NOTE — Progress Notes (Signed)
   Date of Admission:  10/10/2021     ID: Cameron Hammond is a 46 y.o. 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 Getting Toradol every 8 hours Also on Percocet and Flexeril.  No fever   Medications:   enoxaparin (LOVENOX) injection  40 mg Subcutaneous Q24H   HYDROcodone-acetaminophen  1 tablet Oral Q6H   orphenadrine  60 mg Intravenous Q12H    Objective: Vital signs in last 24 hours: Temp:  [97.9 F (36.6 C)-99.3 F (37.4 C)] 98.7 F (37.1 C) (05/30 0828) Pulse Rate:  [61-78] 61 (05/30 0828) Resp:  [18-19] 18 (05/30 0503) BP: (102-119)/(62-70) 102/62 (05/30 0828) SpO2:  [100 %] 100 % (05/30 0828)   PHYSICAL EXAM:  General: In distress Lungs: Clear to auscultation bilaterally. No Wheezing or Rhonchi. No rales. Heart: Regular rate and rhythm, no murmur, rub or gallop. Abdomen: Soft, non-tender,not distended. Bowel sounds normal. No masses Extremities: atraumatic, no cyanosis. No edema. No clubbing Skin: No rashes or lesions. Or bruising Lymph: Cervical, supraclavicular normal. Neurologic: Grossly non-focal  Lab Results Recent Labs    10/14/21 0246 10/15/21 0402 10/15/21 1420  WBC 11.7*  --  13.3*  HGB 10.7*  --  11.9*  HCT 31.4*  --  35.6*  NA 136  --  140  K 2.9*  --  3.8  CL 101  --  96*  CO2 26  --  30  BUN 11  --  19  CREATININE 0.67 0.54* 0.82     Microbiology: 10/10/2021 blood culture MRSA 10/05/2021 4 out of 4 MRSA  Studies/Results:   Discitis/osteomyelitis at T2-T3 with extensive adjacent anterior and lateral paraspinal phlegmon extending superiorly to the lower cervical spine at the level of C6 and inferiorly to the level of T6.  In addition there is possible septic arthritis of the right C2-C3 facet  joint with adjacent facet osteomyelitis and right paraspinal soft tissue inflammation.  Assessment/Plan: MRSA bacteremia with thoracic vertebral and cervical vertebral infection.  There is discitis osteomyelitis at T2-T3 level with extensive paraspinal phlegmon extending from C6 until T6.  Discussed with neurosurgeon.  As epidural phlegmon very minimal surgery is not needed Discussed with IR and they also say it cannot be drained because it is a phlegmon and also it is in the posterior mediastinum.  Persistent bacteremia. changed vancomycin to daptomycin.  If no improvement then we will have to repeat the imaging to see whether there is now any abscess. Discussed the management with the patient and care team.

## 2021-10-16 LAB — CULTURE, BLOOD (ROUTINE X 2)
Special Requests: ADEQUATE
Special Requests: ADEQUATE

## 2021-10-16 MED ORDER — KETOROLAC TROMETHAMINE 30 MG/ML IJ SOLN
30.0000 mg | Freq: Four times a day (QID) | INTRAMUSCULAR | Status: AC
  Administered 2021-10-16 – 2021-10-19 (×12): 30 mg via INTRAVENOUS
  Filled 2021-10-16 (×12): qty 1

## 2021-10-16 MED ORDER — GADOBUTROL 1 MMOL/ML IV SOLN
8.0000 mL | Freq: Once | INTRAVENOUS | Status: AC | PRN
  Administered 2021-10-16: 8 mL via INTRAVENOUS

## 2021-10-16 MED ORDER — SODIUM CHLORIDE 0.9 % IV SOLN
600.0000 mg | Freq: Three times a day (TID) | INTRAVENOUS | Status: AC
  Administered 2021-10-16 – 2021-10-26 (×30): 600 mg via INTRAVENOUS
  Filled 2021-10-16 (×32): qty 20

## 2021-10-16 MED ORDER — HYDROCODONE-ACETAMINOPHEN 7.5-325 MG PO TABS
1.0000 | ORAL_TABLET | Freq: Four times a day (QID) | ORAL | Status: DC | PRN
  Administered 2021-10-16 – 2021-10-20 (×10): 1 via ORAL
  Filled 2021-10-16 (×13): qty 1

## 2021-10-16 NOTE — Hospital Course (Addendum)
Taken from prior notes.  Cameron Hammond is a 46 year old male PMH back pain, IVDU last use 6 months prior to admission (varying history on this).  Presented to ED 10/10/21 complaining of pain in head, neck, upper back x1 week, no relief with chiropractor. 05/25: In ED CT head/cervical spine and CXR negative, CTA chest no PE, paraspinal soft tissue thickening extending from T2-T5 concerning for osteomyelitis.  WBC 17.3.  Started on vancomycin, Zosyn.  Vital signs initially normal, later transient tachycardia/tachypnea, presumably due to pain, requiring IV Dilaudid for pain control.   MRI thoracic and cervical spine: Discitis/osteomyelitis at 2 to T3, extensive adjacent anterior and lateral paraspinal phlegmon extending to C6 and to T6, dorsal and ventral phlegmon T2-T4 ventrally and T1-T5 dorsally, probable septic arthritis of the right C2-C3 facet joint with adjacent facet osteomyelitis and right paraspinal soft tissue inflammation, right sided ventral and lateral phlegmon C2-C3.   MRSA on blood culture 3 of 4 bottles, Zosyn was d/c, vancomycin was continued. 05/26: WBC trending down to 15.4. ID advising neurosurgery consult to eval for washout/procedure to reduce bioburden. TEE may not be needed since will not change therapy of at least 6 weeks abx IV. Repeat BCx in 48 hours to eval for clearance. Neurosurgery reviewed and communicated w/ me via secure chat, no plan at this point for surgical intervention.  05/27: pt reporting pain, see nurse notes from overnight. Otherwise remains of IV abx, ID to peripherally follow through the weekend (today is Saturday, Monday is Memorial Day). UTox (+)opiates (from inpatient administration meds), cannabinoids, tricyclics.  05/28: pain better on scheduled Norco 10 q6h, repeat cultures pending 05/29: (+) repeat blood cultures, ID following, changed abx to daptomycin. I have asked neurosurgery to reevaluate ([name]) 05/30: No plans for NS or IR to go in and wash/debride.  Contineu IV antibitoics per ID and repeat cultures per ID. May need long term IV Abx vs repeat imaging if persistent (+)Cx  5/31: [name].  Norco was added with Toradol.  ID is planning to repeat blood cultures tomorrow and if remain positive we will reimage.  6/1: Repeat blood cultures were obtained today.  MRI repeated yesterday which shows some progression of septic arthritis, discitis and osteomyelitis with some microabscesses. Per neurosurgery he still does not have any indication for surgical intervention and they were recommending continuing medical therapy.  6/2: Repeat blood cultures done yesterday remain negative in 24 hours.  Pain seems to be well controlled with Toradol and Norco.  No surgical intervention required at this time per neurosurgery.  Patient will remain in hospital for a long time to complete at least a 6 weeks course of IV antibiotics.  6/3: Pen Mar 6/1 NGRD. No change in care plan.   6/5: Repeat blood cultures done on 10/17/2021 remain negative.  CRP improved to 3.  Clinically stable.   6/6: Blood cultures from 10/17/2021 remain negative for 5 days.  ESR improving and leukocytosis has been resolved.  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  PICC line ordered and ID is recommending outpatient IV daptomycin for 6 weeks with end date of 11/28/2021. TOC is trying to find a TEFL teacher for home health. Patient also need assistance with medications.  Pharmacy is aware  Week summary from the other provider. Patient continued to complain about pain.  He was  weaned off from Toradol and started on ibuprofen.  PICC line was placed.  Decision was made to keep him in the hospital to complete a 6-week course of IV daptomycin which will be completed on 11/28/2021.  6/14: Patient continued to experience significant upper  back and chest pain which interferes with his sleep.  Stating that ibuprofen is not helping and requesting to restart Toradol.  Also requesting to give Flexeril with Norco as it helps most. Toradol twice daily as needed was restarted. Also added MS Contin 15 mg twice daily Discussed with pharmacy to give Flexeril with Norco  6/15: Patient was able to sleep well after getting Toradol around midnight.  MS Contin which was started yesterday was not very helpful at this dose, dose was increased to 30 mg twice daily.  6/16: Remains stable.  We will continue with current pain regimen and daptomycin.  6/17: Pain remained uncontrolled again.  Unable to sleep last night.  MS Contin dose increased to 45 mg twice daily, Toradol made every 8 hourly as needed.  Also added Mobic.  6/18: Repeat labs were ordered for tomorrow.  Pain seems little improved today, able to get some sleep last night.  We will continue with current regimen. Patient is moving around without any difficulty, we will discontinue DVT prophylaxis today.  6/19: ESR stable in mid 50s, slight worsening of CRP, CBC and CMP stable.  Per patient only Toradol works for his pain.  6/20: Patient continued to have significant pain, Norco was made scheduled along with MS Contin, Toradol will remain as needed.  Due to slightly increased ESR and CRP along with worsening pain, we will repeat thoracic spine MRI today.

## 2021-10-16 NOTE — TOC Progression Note (Signed)
Transition of Care [facility]) - Progression Note    Patient Details  Name: Cameron Hammond MRN: [id number] Date of Birth: April 21, 1976  [facility] of [facility]) CM/SW Contact  Marlowe Sax, RN Phone Number: 10/16/2021, 9:36 AM  Clinical Narrative:    TPOC continues to follow  ID notes show  Persistent bacteremia. changed vancomycin to daptomycin.  If no improvement then we will have to repeat the imaging to see whether there is now any abscess  Unable to set up Outpatient IV ABX AT home due to HX of IV drug use No ins  Expected Discharge Plan: Home/Self Care Barriers to Discharge: Continued Medical Work up  Expected Discharge Plan and Services Expected Discharge Plan: Home/Self Care                                               Social Determinants of Health (SDOH) Interventions    Readmission Risk Interventions     View : No data to display.

## 2021-10-16 NOTE — Progress Notes (Signed)
   Date of Admission:  10/10/2021     ID: Cameron Hammond is a 46 y.o. 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 Active Problems:   History of intravenous drug abuse (HCC)   Hypokalemia   Septic arthritis of cervical spine (HCC)    Subjective: Patient says back pain controlled with toradol The pain sometimes is in the front   Getting Toradol every 6 hours   No fever   Medications:   enoxaparin (LOVENOX) injection  40 mg Subcutaneous Q24H   ketorolac  30 mg Intravenous Q6H   lidocaine  2 patch Transdermal Q24H   orphenadrine  60 mg Intravenous Q12H    Objective: Vital signs in last 24 hours: Temp:  [98.2 F (36.8 C)-99.6 F (37.6 C)] 98.2 F (36.8 C) (05/31 1720) Pulse Rate:  [64-68] 68 (05/31 1720) Resp:  [17] 17 (05/31 0546) BP: (98-109)/(65-67) 98/66 (05/31 1720) SpO2:  [99 %-100 %] 100 % (05/31 1720)   PHYSICAL EXAM:  General: awake and alert Lungs: Clear to auscultation bilaterally. No Wheezing or Rhonchi. No rales. Heart: Regular rate and rhythm, no murmur, rub or gallop. Abdomen: Soft, non-tender,not distended. Bowel sounds normal. No masses Extremities: atraumatic, no cyanosis. No edema. No clubbing Skin: No rashes or lesions. Or bruising Lymph: Cervical, supraclavicular normal. Neurologic: Grossly non-focal  Lab Results Recent Labs    10/14/21 0246 10/15/21 0402 10/15/21 1420  WBC 11.7*  --  13.3*  HGB 10.7*  --  11.9*  HCT 31.4*  --  35.6*  NA 136  --  140  K 2.9*  --  3.8  CL 101  --  96*  CO2 26  --  30  BUN 11  --  19  CREATININE 0.67 0.54* 0.82     Microbiology: 10/10/2021 blood culture MRSA 10/05/2021 4 out of 4 MRSA  Studies/Results:   Discitis/osteomyelitis at T2-T3 with extensive adjacent anterior and lateral paraspinal phlegmon extending superiorly to the lower cervical spine at the level of C6 and inferiorly to the level of T6.  In addition there is possible septic  arthritis of the right C2-C3 facet joint with adjacent facet osteomyelitis and right paraspinal soft tissue inflammation.  Assessment/Plan: MRSA bacteremia with thoracic vertebral and cervical vertebral infection.  There is discitis osteomyelitis at T2-T3 level with extensive paraspinal phlegmon extending from C6 until T6.  Discussed with neurosurgeon.  As epidural phlegmon very minimal surgery is not needed Discussed with IR and they also say it cannot be drained because it is a phlegmon and also it is in the posterior mediastinum.  Persistent bacteremia. on daptomycin- will add ceftaroline for dual MRSA coverage  as also MRI repeat shows worsening infection Spoke with Neurosurgeon Dr.Yarbrough who reviewed the new films. Not a surgical candidate as minimal epidural phlegmon  Discussed the management with the patient and his mother.

## 2021-10-16 NOTE — Progress Notes (Signed)
Progress Note   Patient: Cameron Hammond HDQ:[id number] DOB: 1975-10-31 DOA: 10/10/2021     6 DOS: the patient was seen and examined on 10/16/2021   Brief hospital course: Taken from prior notes.  Mr. Cameron Hammond is a 46 year old male PMH back pain, IVDU last use 6 months prior to admission (varying history on this).  Presented to ED 10/10/21 complaining of pain in head, neck, upper back x1 week, no relief with chiropractor. [name] 05/25: In ED CT head/cervical spine and CXR negative, CTA chest no PE, paraspinal soft tissue thickening extending from T2-T5 concerning for osteomyelitis.  WBC 17.3.  Started on vancomycin, Zosyn.  Vital signs initially normal, later transient tachycardia/tachypnea, presumably due to pain, requiring IV Dilaudid for pain control.   ? MRI thoracic and cervical spine: Discitis/osteomyelitis at 2 to T3, extensive adjacent anterior and lateral paraspinal phlegmon extending to C6 and to T6, dorsal and ventral phlegmon T2-T4 ventrally and T1-T5 dorsally, probable septic arthritis of the right C2-C3 facet joint with adjacent facet osteomyelitis and right paraspinal soft tissue inflammation, right sided ventral and lateral phlegmon C2-C3.   ? MRSA on blood culture 3 of 4 bottles, Zosyn was d/c, vancomycin was continued. [name] 05/26: WBC trending down to 15.4. ID advising neurosurgery consult to eval for washout/procedure to reduce bioburden. TEE may not be needed since will not change therapy of at least 6 weeks abx IV. Repeat BCx in 48 hours to eval for clearance. Neurosurgery reviewed and communicated w/ me via secure chat, no plan at this point for surgical intervention.  [name] 05/27: pt reporting pain, see nurse notes from overnight. Otherwise remains of IV abx, ID to peripherally follow through the weekend (today is Saturday, Monday is Memorial Day). UTox (+)opiates (from inpatient administration meds), cannabinoids, tricyclics.  [name] 05/28: pain better on scheduled Norco 10 q6h, repeat cultures  pending . 05/29: (+) repeat blood cultures, ID following, changed abx to daptomycin. I have asked neurosurgery to reevaluate ([name]) . 05/30: No plans for NS or IR to go in and wash/debride. Contineu IV antibitoics per ID and repeat cultures per ID. May need long term IV Abx vs repeat imaging if persistent (+)Cx  5/31: [name].  Norco was added with Toradol.  ID is planning to repeat blood cultures tomorrow and if remain positive we will reimage.   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 Vancomycin --> Daptomycin on  10/15/21    not meeting sepsis criteria as of 10/11/21   Echocardiogram showed NO vegetations on TTE, should not need TEE since won't change IV abx course.   Neurosurgical input via secure chat 05/26: MRI no discrete abscess, and no neurologic deficits to warrant emergent intervention, will continue neurologic checks q shift and prn, neurosurgery advises no intervention / no surgical procedure at this time since unlikely to drain anything, if needed could consider IR to sample   Pain control with toradol and oxycodone   Repeat (+)blood cultures 10/13/21, ID following, have asked neurosurgery to reevaluate (secure chat to [name] 05/29)  We will repeat blood cultures tomorrow and if remain positive a repeat MRI will be needed.  Septic arthritis of cervical spine (HCC) Management as above  History of intravenous drug abuse (HCC) States last use was six months ago  Counseled on abstinence  Hypokalemia Resolved. -Continue  to monitor and replete as needed   Subjective: Patient continued to experience significant pain, stating that Toradol is only working for 4 hours.  Mother at bedside  Physical Exam: Vitals:   10/15/21 1807 10/15/21 1810  10/15/21 2101 10/16/21 0546  BP: 132/81 131/76 109/65 105/67  Pulse: 80 82 67 64  Resp: 20 20 17 17   Temp:   98.3 F (36.8 C) 99.6 F (37.6 C)  TempSrc:      SpO2: 99% 100% 99% 100%  Weight:      Height:       General.  Well-developed gentleman, appears in pain. Pulmonary.  Lungs clear bilaterally, normal respiratory effort. CV.  Regular rate and rhythm, no JVD, rub or murmur. Abdomen.  Soft, nontender, nondistended, BS positive. CNS.  Alert and oriented .  No focal neurologic deficit. Extremities.  No edema, no cyanosis, pulses intact and symmetrical. Psychiatry.  Judgment and insight appears normal.  Data Reviewed: Prior notes, labs and images reviewed  Family Communication: Discussed with mother at bedside  Disposition: Status is: Inpatient Remains inpatient appropriate because: Severity of illness   Planned Discharge Destination: Home  [name].  Lovenox Time spent: 50 minutes  This record has been created using . Errors have been sought and corrected,but may not always be located. Such creation errors do not reflect on the standard of care.  Author: Conservation officer, historic buildings, MD 10/16/2021 5:17 PM  For on call review [email or web address].

## 2021-10-17 LAB — CREATININE, SERUM
Creatinine, Ser: 0.68 mg/dL (ref 0.61–1.24)
GFR, Estimated: >60 mL/min (ref >60)

## 2021-10-17 NOTE — Progress Notes (Signed)
Date of Admission:  10/10/2021     ID: Cameron Hammond is a 46 y.o. 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 Active Problems:   History of intravenous drug abuse (HCC)   Hypokalemia   Septic arthritis of cervical spine (HCC)    Subjective: He is feeling more comfortable Pain in the back and between shoulder blades is improving No fever  Medications:   enoxaparin (LOVENOX) injection  40 mg Subcutaneous Q24H   ketorolac  30 mg Intravenous Q6H   lidocaine  2 patch Transdermal Q24H   orphenadrine  60 mg Intravenous Q12H    Objective: Vital signs in last 24 hours: Temp:  [98 F (36.7 C)-98.8 F (37.1 C)] 98.4 F (36.9 C) (06/01 1208) Pulse Rate:  [56-78] 78 (06/01 1208) Resp:  [17-20] 18 (06/01 1208) BP: (98-134)/(66-77) 115/75 (06/01 1208) SpO2:  [100 %] 100 % (06/01 1208)  PHYSICAL EXAM:  General: Alert, cooperative, no distress at the moment, appears stated age.  Lungs: Clear to auscultation bilaterally. No Wheezing or Rhonchi. No rales. Heart: Regular rate and rhythm, no murmur, rub or gallop. Abdomen: Soft, non-tender,not distended. Bowel sounds normal. No masses Extremities: atraumatic, no cyanosis. No edema. No clubbing Skin: No rashes or lesions. Or bruising Lymph: Cervical, supraclavicular normal. Neurologic: Grossly non-focal  Lab Results Recent Labs    10/15/21 1420 10/17/21 0407  WBC 13.3*  --   HGB 11.9*  --   HCT 35.6*  --   NA 140  --   K 3.8  --   CL 96*  --   CO2 30  --   BUN 19  --   CREATININE 0.82 0.68    Sedimentation Rate Recent Labs    10/14/21 2132  ESRSEDRATE 128*   C-Reactive Protein Recent Labs    10/15/21 0402  CRP 20.4*    Microbiology: 10/10/2021 MRSA and blood culture 10/13/2021 MRSA and blood culture 10/17/2021 blood culture has been sent  Studies/Results: MR CERVICAL SPINE W WO CONTRAST  Result Date: 10/16/2021 CLINICAL DATA:  Follow-up examination for  osteomyelitis. EXAM: MRI CERVICAL AND THORACIC SPINE WITHOUT AND WITH CONTRAST TECHNIQUE: Multiplanar and multiecho pulse sequences of the cervical spine, to include the craniocervical junction and cervicothoracic junction, and the thoracic spine, were obtained without and with intravenous contrast. CONTRAST:  16mL GADAVIST GADOBUTROL 1 MMOL/ML IV SOLN COMPARISON:  Prior MRI from 10/10/2021. FINDINGS: MRI CERVICAL SPINE FINDINGS Alignment: Trace anterolisthesis of C2 on C3. Straightening of the normal cervical lordosis elsewhere. Vertebrae: Prominent marrow edema and enhancement seen about the right C2-3 facet, consistent with ongoing acute septic arthritis. Changes are slightly more prominent and progressed as compared to previous exam. Associated joint effusion with adjacent soft tissue inflammation and enhancement. Few small micro abscesses involving the right paraspinous musculature posterior to the inflamed right C2-3 facet measure up to 4 mm (series 13, image 7). Minimal adjacent epidural enhancement without discrete collection. No other findings of osteomyelitis discitis elsewhere within the cervical spine. Minimal reactive marrow edema about the contralateral left C2-3 facet as well as the C7-T1 facets favored to be degenerative in nature. Vertebral body height maintained without acute or interval fracture. Underlying bone marrow signal intensity within normal limits. Few small benign hemangiomata noted. No worrisome osseous lesions. Cord: Minimal epidural enhancement adjacent to the right C2-3 facet. Epidural phlegmon and enhancement noted within the upper thoracic spine, described on thoracic spine portion of this exam. No other epidural collections. Normal signal  and morphology within the cervical spinal cord. Posterior Fossa, vertebral arteries, paraspinal tissues: Remote pontine lacunar infarct noted. Craniocervical junction normal. Paraspinous inflammatory changes with a few small micro abscesses  adjacent to the right C2-3 facet as above. Paraspinous soft tissues demonstrate no other acute finding. Normal flow voids seen within the vertebral arteries bilaterally. Disc levels: C2-C3: Trace anterolisthesis. Mild disc bulge with right greater than left uncovertebral and facet hypertrophy. No spinal stenosis. Mild left with moderate right C3 stenosis. C3-C4: Mild uncovertebral spurring without significant disc bulge. Bilateral facet hypertrophy. No spinal stenosis. Moderate left with mild right C4 foraminal stenosis. C4-C5: Minimal disc bulge with uncovertebral hypertrophy. Mild right-sided facet degeneration. No spinal stenosis. Moderate right C5 foraminal narrowing. Left neural foramen remains patent. C5-C6: Broad-based right paracentral disc protrusion flattens and partially effaces the ventral thecal sac. Mild cord flattening without cord signal changes. Mild spinal stenosis. Superimposed uncovertebral spurring with resultant moderate bilateral C6 foraminal narrowing. C6-C7: Mild disc bulge with bilateral uncovertebral spurring. Flattening and partial effacement of the ventral thecal sac with resultant mild spinal stenosis. Moderate left worse than right C7 foraminal narrowing. C7-T1: Negative interspace. Bilateral facet arthrosis. No spinal stenosis. Foramina remain patent. MRI THORACIC SPINE FINDINGS Alignment: Vertebral bodies normally aligned with preservation of the normal thoracic kyphosis. No interval listhesis. Vertebrae: Persistent findings of osteomyelitis discitis again seen at T2-3, progressed from prior with worsened marrow edema and enhancement. Disc space height is relatively stable at this time. Additionally, changes of osteomyelitis discitis now seen involving the contiguous T3-4 interspace as well, progressed. No interval pathologic fracture. Paraspinous phlegmon and inflammation is similar to slightly more prominent as compared to previous. Associated epidural phlegmon/abscess within the  ventral epidural space extending from T2-3 through T4-5 (series 13, image 9). Collection measures up to 5 mm in maximal AP diameter, greatest within the right epidural space at the level of T3. Additionally, epidural enhancement seen involving the dorsal epidural space extending from C7-T1 through T5-6 (series 13, image 10). Associated trace dorsal epidural phlegmon/abscess at the level of T2-3. Resultant mild spinal stenosis, greatest at the level of T3. Overall, these changes have progressed and worsened from prior. No other evidence for new or distant infection elsewhere within the thoracic spine. Additional scattered endplate changes within the mid and lower thoracic spine favored to be degenerative in nature. Underlying bone marrow signal intensity within normal limits. Probable atypical hemangioma noted at the posterior T10 vertebral body. No worrisome osseous lesions. Cord: Probable trace syrinx at the level of T4-5, also seen on prior exam (series 8, image 13). Otherwise, no other convincing signal abnormality seen elsewhere within the thoracic spinal cord. Epidural phlegmon/abscess involving the upper thoracic spine as detailed above. Paraspinal and other soft tissues: Paraspinous phlegmon and inflammation about the T2-3 and T3-4. Few probable small micro abscesses in this area, largest of which measures 9 mm on the right (series 12, image 12). Small layering bilateral pleural effusions noted. Disc levels: Underlying mild multilevel degenerative disc disease again noted throughout the mid and lower thoracic spine, stable. No new or progressive finding. IMPRESSION: MRI CERVICAL SPINE IMPRESSION: 1. Findings consistent with persistent acute septic arthritis involving the right C2-3 facet, progressed from previous. Associated paraspinous soft tissue inflammation with a few small micro abscesses as above. Minimal adjacent epidural enhancement without discrete epidural abscess or significant spinal stenosis. 2.  Underlying multilevel cervical spondylosis with resultant mild spinal stenosis at C5-6 and C6-7. MRI THORACIC SPINE IMPRESSION: 1. Progressive changes of osteomyelitis discitis at  T2-3, with new involvement about the adjacent T3-4 interspace. Associated epidural phlegmon/abscess measuring up to 5 mm in maximal AP diameter at the level of T3 with associated mild spinal stenosis, progressed. 2. Associated paraspinous phlegmon and inflammation at T2-3 and T3-4 with a few probable superimposed soft tissue micro abscesses as above, also mildly progressed. 3. Probable tiny syrinx at the level of T4-5, stable. 4. Small layering bilateral pleural effusions. Electronically Signed   By: Rise MuBenjamin  McClintock M.D.   On: 10/16/2021 05:33   MR THORACIC SPINE W WO CONTRAST  Result Date: 10/16/2021 CLINICAL DATA:  Follow-up examination for osteomyelitis. EXAM: MRI CERVICAL AND THORACIC SPINE WITHOUT AND WITH CONTRAST TECHNIQUE: Multiplanar and multiecho pulse sequences of the cervical spine, to include the craniocervical junction and cervicothoracic junction, and the thoracic spine, were obtained without and with intravenous contrast. CONTRAST:  8mL GADAVIST GADOBUTROL 1 MMOL/ML IV SOLN COMPARISON:  Prior MRI from 10/10/2021. FINDINGS: MRI CERVICAL SPINE FINDINGS Alignment: Trace anterolisthesis of C2 on C3. Straightening of the normal cervical lordosis elsewhere. Vertebrae: Prominent marrow edema and enhancement seen about the right C2-3 facet, consistent with ongoing acute septic arthritis. Changes are slightly more prominent and progressed as compared to previous exam. Associated joint effusion with adjacent soft tissue inflammation and enhancement. Few small micro abscesses involving the right paraspinous musculature posterior to the inflamed right C2-3 facet measure up to 4 mm (series 13, image 7). Minimal adjacent epidural enhancement without discrete collection. No other findings of osteomyelitis discitis elsewhere  within the cervical spine. Minimal reactive marrow edema about the contralateral left C2-3 facet as well as the C7-T1 facets favored to be degenerative in nature. Vertebral body height maintained without acute or interval fracture. Underlying bone marrow signal intensity within normal limits. Few small benign hemangiomata noted. No worrisome osseous lesions. Cord: Minimal epidural enhancement adjacent to the right C2-3 facet. Epidural phlegmon and enhancement noted within the upper thoracic spine, described on thoracic spine portion of this exam. No other epidural collections. Normal signal and morphology within the cervical spinal cord. Posterior Fossa, vertebral arteries, paraspinal tissues: Remote pontine lacunar infarct noted. Craniocervical junction normal. Paraspinous inflammatory changes with a few small micro abscesses adjacent to the right C2-3 facet as above. Paraspinous soft tissues demonstrate no other acute finding. Normal flow voids seen within the vertebral arteries bilaterally. Disc levels: C2-C3: Trace anterolisthesis. Mild disc bulge with right greater than left uncovertebral and facet hypertrophy. No spinal stenosis. Mild left with moderate right C3 stenosis. C3-C4: Mild uncovertebral spurring without significant disc bulge. Bilateral facet hypertrophy. No spinal stenosis. Moderate left with mild right C4 foraminal stenosis. C4-C5: Minimal disc bulge with uncovertebral hypertrophy. Mild right-sided facet degeneration. No spinal stenosis. Moderate right C5 foraminal narrowing. Left neural foramen remains patent. C5-C6: Broad-based right paracentral disc protrusion flattens and partially effaces the ventral thecal sac. Mild cord flattening without cord signal changes. Mild spinal stenosis. Superimposed uncovertebral spurring with resultant moderate bilateral C6 foraminal narrowing. C6-C7: Mild disc bulge with bilateral uncovertebral spurring. Flattening and partial effacement of the ventral thecal  sac with resultant mild spinal stenosis. Moderate left worse than right C7 foraminal narrowing. C7-T1: Negative interspace. Bilateral facet arthrosis. No spinal stenosis. Foramina remain patent. MRI THORACIC SPINE FINDINGS Alignment: Vertebral bodies normally aligned with preservation of the normal thoracic kyphosis. No interval listhesis. Vertebrae: Persistent findings of osteomyelitis discitis again seen at T2-3, progressed from prior with worsened marrow edema and enhancement. Disc space height is relatively stable at this time. Additionally, changes of osteomyelitis  discitis now seen involving the contiguous T3-4 interspace as well, progressed. No interval pathologic fracture. Paraspinous phlegmon and inflammation is similar to slightly more prominent as compared to previous. Associated epidural phlegmon/abscess within the ventral epidural space extending from T2-3 through T4-5 (series 13, image 9). Collection measures up to 5 mm in maximal AP diameter, greatest within the right epidural space at the level of T3. Additionally, epidural enhancement seen involving the dorsal epidural space extending from C7-T1 through T5-6 (series 13, image 10). Associated trace dorsal epidural phlegmon/abscess at the level of T2-3. Resultant mild spinal stenosis, greatest at the level of T3. Overall, these changes have progressed and worsened from prior. No other evidence for new or distant infection elsewhere within the thoracic spine. Additional scattered endplate changes within the mid and lower thoracic spine favored to be degenerative in nature. Underlying bone marrow signal intensity within normal limits. Probable atypical hemangioma noted at the posterior T10 vertebral body. No worrisome osseous lesions. Cord: Probable trace syrinx at the level of T4-5, also seen on prior exam (series 8, image 13). Otherwise, no other convincing signal abnormality seen elsewhere within the thoracic spinal cord. Epidural phlegmon/abscess  involving the upper thoracic spine as detailed above. Paraspinal and other soft tissues: Paraspinous phlegmon and inflammation about the T2-3 and T3-4. Few probable small micro abscesses in this area, largest of which measures 9 mm on the right (series 12, image 12). Small layering bilateral pleural effusions noted. Disc levels: Underlying mild multilevel degenerative disc disease again noted throughout the mid and lower thoracic spine, stable. No new or progressive finding. IMPRESSION: MRI CERVICAL SPINE IMPRESSION: 1. Findings consistent with persistent acute septic arthritis involving the right C2-3 facet, progressed from previous. Associated paraspinous soft tissue inflammation with a few small micro abscesses as above. Minimal adjacent epidural enhancement without discrete epidural abscess or significant spinal stenosis. 2. Underlying multilevel cervical spondylosis with resultant mild spinal stenosis at C5-6 and C6-7. MRI THORACIC SPINE IMPRESSION: 1. Progressive changes of osteomyelitis discitis at T2-3, with new involvement about the adjacent T3-4 interspace. Associated epidural phlegmon/abscess measuring up to 5 mm in maximal AP diameter at the level of T3 with associated mild spinal stenosis, progressed. 2. Associated paraspinous phlegmon and inflammation at T2-3 and T3-4 with a few probable superimposed soft tissue micro abscesses as above, also mildly progressed. 3. Probable tiny syrinx at the level of T4-5, stable. 4. Small layering bilateral pleural effusions. Electronically Signed   By: Rise Mu M.D.   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 Seen by neurosurgeon No surgical intervention currently Patient is on dual MRSA coverage with daptomycin and ceftaroline. Repeat blood cultures have been sent Will  not be able to place a PICC line until he clears bacteremia. Will need minimum of 6 to 8 weeks of IV antibiotic. Discussed the management with the patient and care team.

## 2021-10-17 NOTE — Consult Note (Signed)
From Cameron Hammond's note 10/11/21: History of Present Illness: Pleasant 46 year old man with a history of IV drug abuse.  Has had chronic pain, however had worsening in the past 1 to 2 weeks.  His upper back and shoulder blades.  On evaluation was found to have discitis/osteomyelitis.  Denies any weakness numbness or tingling.  Denies any trouble with his bowel or bladder.  Did not have any new neurologic symptoms.  Mostly just pain in the upper back/lower neck.  Update: 10/17/2021 He continues to have severe back pain, but no new neurologic symptoms.  Review of Systems:  A 10 point review of systems is negative, except for the pertinent positives and negatives detailed in the HPI.  Past Medical History: Past Medical History:  Diagnosis Date   Back pain    Chronic back pain    Sinusitis     Past Surgical History: History reviewed. No pertinent surgical history.  Allergies: Allergies as of 10/10/2021   (No Known Allergies)    Medications:  Current Facility-Administered Medications:    acetaminophen (TYLENOL) tablet 650 mg, 650 mg, Oral, Q6H PRN, 650 mg at 10/12/21 2006 **OR** acetaminophen (TYLENOL) suppository 650 mg, 650 mg, Rectal, Q6H PRN, Cameron Baumann, MD   ceftaroline (TEFLARO) 600 mg in sodium chloride 0.9 % 100 mL IVPB, 600 mg, Intravenous, Q8H, Cameron, Rhodia Albright, MD, Last Rate: 100 mL/hr at 10/17/21 0557, 600 mg at 10/17/21 0557   cyclobenzaprine (FLEXERIL) tablet 5 mg, 5 mg, Oral, TID PRN, Cameron Baumann, MD, 5 mg at 10/15/21 1648   DAPTOmycin (CUBICIN) 800 mg in sodium chloride 0.9 % IVPB, 10 mg/kg, Intravenous, Q2000, Lynn Ito, MD, Last Rate: 132 mL/hr at 10/16/21 1038, 800 mg at 10/16/21 1038   enoxaparin (LOVENOX) injection 40 mg, 40 mg, Subcutaneous, Q24H, Cameron Royal V, MD, 40 mg at 10/16/21 2244   HYDROcodone-acetaminophen (NORCO) 7.5-325 MG per tablet 1 tablet, 1 tablet, Oral, Q6H PRN, Arnetha Courser, MD, 1 tablet at 10/16/21 2244    ketorolac (TORADOL) 30 MG/ML injection 30 mg, 30 mg, Intravenous, Q6H, Arnetha Courser, MD, 30 mg at 10/17/21 0745   lidocaine (LIDODERM) 5 % 2 patch, 2 patch, Transdermal, Q24H, Cameron Nielsen, DO, 2 patch at 10/16/21 1748   ondansetron (ZOFRAN) tablet 4 mg, 4 mg, Oral, Q6H PRN **OR** ondansetron (ZOFRAN) injection 4 mg, 4 mg, Intravenous, Q6H PRN, Cameron Baumann, MD   orphenadrine (NORFLEX) injection 60 mg, 60 mg, Intravenous, Q12H, Hammond, Cameron B, FNP, 60 mg at 10/17/21 7169   Social History: Social History   Tobacco Use   Smoking status: Every Day   Smokeless tobacco: Never  Substance Use Topics   Alcohol use: Not Currently   Drug use: No    Family Medical History: No family history on file.  Physical Examination: Vitals:   10/17/21 0733 10/17/21 0735  BP: 134/76 130/77  Pulse: 75 71  Resp:    Temp: 98.1 F (36.7 C) 98.8 F (37.1 C)  SpO2: 100% 100%     General: Patient is well developed, well nourished, continued with pain.  General: In pain with excess movements. Awake, alert, oriented to person, place, and time.  Pupils equal round and reactive to light.  Facial tone is symmetric.  Tongue protrusion is midline.  There is no pronator drift.  ROM of spine: Pain to palpation throughout the upper T spine.  Strength: Side Biceps Triceps Deltoid Interossei Grip Wrist Ext. Wrist Flex.  R 5 5 5 5 5 5 5   L 5 5  5 5 5 5 5    Side Iliopsoas Quads Hamstring PF DF EHL  R 5 5 5 5 5 5   L 5 5 5 5 5 5     Bilateral upper and lower extremity sensation is intact to light touch. Reflexes are 2+ and symmetric at the biceps, triceps, brachioradialis, patella and achilles. Hoffman's is absent.  Clonus is not present.  Toes are down-going.    Gait is deferred due to pain level.  Imaging:  Narrative & Impression  CLINICAL DATA:  Myelopathy, acute, cervical spine; Myelopathy, acute, thoracic spine   EXAM: MRI CERVICAL AND THORACIC SPINE WITHOUT AND WITH CONTRAST    TECHNIQUE: Multiplanar and multiecho pulse sequences of the cervical spine, to include the craniocervical junction and cervicothoracic junction, and the thoracic spine, were obtained without and with intravenous contrast.   CONTRAST:  7.57mL GADAVIST GADOBUTROL 1 MMOL/ML IV SOLN   COMPARISON:  Same day CT chest   FINDINGS: MRI CERVICAL SPINE FINDINGS   Motion degraded exam.   Alignment: Trace anterolisthesis at C2-C3.   Vertebrae: No evidence of acute fracture or aggressive osseous lesion.   Cord: Normal signal and morphology.   Posterior Fossa, vertebral arteries, paraspinal tissues: Preserved vertebral artery flow voids. The posterior fossa is unremarkable. There is soft tissue edema and enhancement along the right posterior paraspinal soft tissues adjacent to the right C2-C3 facet. There is paraspinal soft tissue edema along the upper thoracic spine extending into the anterior lower aspect of the cervical spine to level of C6, described below.   Disc levels: The craniocervical junction is unremarkable.   C2-C3: Trace degenerative anterolisthesis. Advanced right-sided facet arthropathy with cortical irregularity, periarticular marrow edema and enhancement, and a right-sided facet effusion. Mild bilateral neural foraminal stenosis.Minimal spinal canal narrowing. Right sided ventral and lateral epidural phlegmon measuring up to 2 mm in thicknes   C3-C4: Minimal disc bulging. Minimal spinal canal narrowing. Mild bilateral facet arthropathy.Moderate left neural foraminal stenosis. No right neural foraminal stenosis.   C4-C5: Asymmetric right disc bulging. Mild bilateral facet arthropathy. Minimal canal narrowing. Mild right neural foraminal stenosis. No left neural foraminal stenosis.   C5-C6: Posterior disc osteophyte complex and uncovertebral joint hypertrophy. Mild spinal canal stenosis. Moderate bilateral neural foraminal stenosis, left greater than right.    C6-C7: Posterior disc osteophyte complex. Minimal canal narrowing. Mild bilateral neural foraminal stenosis.   C7-T1: No significant spinal canal or neural foraminal narrowing.   MRI THORACIC SPINE FINDINGS   Alignment:  Physiologic.   Vertebrae: There is endplate irregularity, marrow edema and enhancement at T2-T3 with minimally increased abnormal signal in the intervertebral disc. Focal T1 and T2 hyperintense lesion within the posterior aspect of the T10 vertebral body on the left near the pedicle with central signal dropout on STIR likely an atypical hemangioma. Scattered additional degenerative endplate changes.   Cord: No definite abnormal cord signal.   Paraspinal and other soft tissues: There is extensive anterior and lateral paraspinal soft tissue edema and enhancement spanning T2 through T6 consistent with phlegmon. There is thin phlegmonous extension into the anterior paraspinal soft tissues of the lower cervical spine to the level of C6 (cervical spine MRI sagittal postcontrast image 3).   Disc levels: There is disc bulging at T2-T3, asymmetric right resulting in moderate to severe right-sided neural foraminal stenosis. There is ventral and dorsal epidural phlegmon spanning from T2-T4 ventrally and T1-T5 dorsally, measuring up to 2-3 mm and thickness.   There is multilevel disc desiccation throughout the thoracic spine, with  additional mild disc bulging at T3-T4 resulting in mild bilateral neural foraminal stenosis and minimal disc bulging at T10-T11 and T11-T12 resulting in no significant stenosis.   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 There is also adjacent dorsal and ventral epidural phlegmon measuring up to 2-3 mm in thickness, spanning from T2-T4 ventrally and T1-T5 dorsally.   In  addition, there is probable septic arthritis of the right C2-C3 facet joint with adjacent facet osteomyelitis and right paraspinal soft tissue inflammation at this level. Right sided ventral and lateral epidural phlegmon at C2-C3 measuring up to 2 mm in thickness.   Degenerative changes in the cervical and thoracic spine as described above.     Electronically Signed   By: Caprice RenshawJacob  Kahn M.D.   On: 10/10/2021 22:02   MRI CT spine  10/15/21 IMPRESSION: MRI CERVICAL SPINE IMPRESSION:   1. Findings consistent with persistent acute septic arthritis involving the right C2-3 facet, progressed from previous. Associated paraspinous soft tissue inflammation with a few small micro abscesses as above. Minimal adjacent epidural enhancement without discrete epidural abscess or significant spinal stenosis. 2. Underlying multilevel cervical spondylosis with resultant mild spinal stenosis at C5-6 and C6-7.   MRI THORACIC SPINE IMPRESSION:   1. Progressive changes of osteomyelitis discitis at T2-3, with new involvement about the adjacent T3-4 interspace. Associated epidural phlegmon/abscess measuring up to 5 mm in maximal AP diameter at the level of T3 with associated mild spinal stenosis, progressed. 2. Associated paraspinous phlegmon and inflammation at T2-3 and T3-4 with a few probable superimposed soft tissue micro abscesses as above, also mildly progressed. 3. Probable tiny syrinx at the level of T4-5, stable. 4. Small layering bilateral pleural effusions.     Electronically Signed   By: Rise MuBenjamin  McClintock M.D.   On: 10/16/2021 05:33  I have personally reviewed the images and agree with the above interpretation.  Labs:    Latest Ref Rng & Units 10/15/2021    2:20 PM 10/14/2021    2:46 AM 10/12/2021    4:23 AM  CBC  WBC 4.0 - 10.5 K/uL 13.3   11.7   13.0    Hemoglobin 13.0 - 17.0 g/dL 16.111.9   09.610.7   04.512.3    Hematocrit 39.0 - 52.0 % 35.6   31.4   36.1    Platelets 150 - 400 K/uL 574    398   296         Assessment and Plan: Mr. Cameron Hammond is a pleasant 46 y.o. 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  At this point, he does not have an indication for surgical intervention.  I recommend continue medical therapy.    I reviewed with him that he is likely to have continued pain, and may have chronic pain after his infection has cleared.  I am hopeful that his infection will improve with the current medical regimen.  Venetia Nighthester Toshie Demelo MD Dept. of Neurosurgery

## 2021-10-17 NOTE — Progress Notes (Signed)
Progress Note   Patient: Cameron Hammond GMW:[id number] DOB: [birth year] DOA: 10/10/2021     7 DOS: the patient was seen and examined on 10/17/2021   Brief hospital course: Taken from prior notes.  Mr. Laura is a 46 year old male PMH back pain, IVDU last use 6 months prior to admission (varying history on this).  Presented to ED 10/10/21 complaining of pain in head, neck, upper back x1 week, no relief with chiropractor. [name] 05/25: In ED CT head/cervical spine and CXR negative, CTA chest no PE, paraspinal soft tissue thickening extending from T2-T5 concerning for osteomyelitis.  WBC 17.3.  Started on vancomycin, Zosyn.  Vital signs initially normal, later transient tachycardia/tachypnea, presumably due to pain, requiring IV Dilaudid for pain control.   ? MRI thoracic and cervical spine: Discitis/osteomyelitis at 2 to T3, extensive adjacent anterior and lateral paraspinal phlegmon extending to C6 and to T6, dorsal and ventral phlegmon T2-T4 ventrally and T1-T5 dorsally, probable septic arthritis of the right C2-C3 facet joint with adjacent facet osteomyelitis and right paraspinal soft tissue inflammation, right sided ventral and lateral phlegmon C2-C3.   ? MRSA on blood culture 3 of 4 bottles, Zosyn was d/c, vancomycin was continued. [name] 05/26: WBC trending down to 15.4. ID advising neurosurgery consult to eval for washout/procedure to reduce bioburden. TEE may not be needed since will not change therapy of at least 6 weeks abx IV. Repeat BCx in 48 hours to eval for clearance. Neurosurgery reviewed and communicated w/ me via secure chat, no plan at this point for surgical intervention.  [name] 05/27: pt reporting pain, see nurse notes from overnight. Otherwise remains of IV abx, ID to peripherally follow through the weekend (today is Saturday, Monday is Memorial Day). UTox (+)opiates (from inpatient administration meds), cannabinoids, tricyclics.  [name] 05/28: pain better on scheduled Norco 10 q6h, repeat cultures  pending . 05/29: (+) repeat blood cultures, ID following, changed abx to daptomycin. I have asked neurosurgery to reevaluate ([name]) . 05/30: No plans for NS or IR to go in and wash/debride. Contineu IV antibitoics per ID and repeat cultures per ID. May need long term IV Abx vs repeat imaging if persistent (+)Cx  5/31: [name].  Norco was added with Toradol.  ID is planning to repeat blood cultures tomorrow and if remain positive we will reimage.  6/1: Repeat blood cultures were obtained today.  MRI repeated yesterday which shows some progression of septic arthritis, discitis and osteomyelitis with some microabscesses. Per neurosurgery he still does not have any indication for surgical intervention and they were recommending continuing medical therapy.   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 Vancomycin --> Daptomycin on  10/15/21    not meeting sepsis criteria as of 10/11/21   Echocardiogram showed NO vegetations on TTE, should not need TEE since won't change IV abx course.   Neurosurgical input via secure chat 05/26: MRI no discrete abscess, and no neurologic deficits to warrant emergent intervention, will continue neurologic checks q shift and prn, neurosurgery advises no intervention / no surgical procedure at this time since unlikely to drain anything, if needed could consider IR to sample   Pain control with toradol and oxycodone   Repeat (+)blood cultures 10/13/21, ID following, have asked neurosurgery to reevaluate (secure chat to [name] 05/29)  Repeat MRI with some  progression of infection but neurosurgery would like to continue with conservative management.  Repeat blood cultures were done today-pending results  Septic arthritis of cervical spine  (HCC) Management as above  History of intravenous drug abuse (HCC) States last use was six months ago  Counseled on abstinence  Hypokalemia Resolved. -Continue to monitor and replete as needed   Subjective: Patient was seen and examined today.  Continued to have upper back and left shoulder blade pain but stating that current regimen is keeping the pain bearable.  Physical Exam: Vitals:   10/17/21 0438 10/17/21 0733 10/17/21 0735 10/17/21 1208  BP: 120/71 134/76 130/77 115/75  Pulse: (!) 56 75 71 78  Resp: 20   18  Temp: 98 F (36.7 C) 98.1 F (36.7 C) 98.8 F (37.1 C) 98.4 F (36.9 C)  TempSrc:  Oral Oral Oral  SpO2: 100% 100% 100% 100%  Weight:      Height:       General.  Well-developed gentleman, in no acute distress. Pulmonary.  Lungs clear bilaterally, normal respiratory effort. CV.  Regular rate and rhythm, no JVD, rub or murmur. Abdomen.  Soft, nontender, nondistended, BS positive. CNS.  Alert and oriented .  No focal neurologic deficit. Extremities.  No edema, no cyanosis, pulses intact and symmetrical. Psychiatry.  Judgment and insight appears normal.  Data Reviewed: Prior notes, images and labs reviewed  Family Communication: Discussed with patient  Disposition: Status is: Inpatient Remains inpatient appropriate because: Severity of illness.  Patient will need prolonged hospitalization for IV antibiotics due to his history of IV drug use   Planned Discharge Destination: Home  DVT prophylaxis.  Lovenox Time spent: 45 minutes  This record has been created using Conservation officer, historic buildings. Errors have been sought and corrected,but may not always be located. Such creation errors do not reflect on the standard of care.  Author: Arnetha Courser, MD 10/17/2021 3:11 PM  For on call review [email or web address].

## 2021-10-17 NOTE — Assessment & Plan Note (Signed)
Management as above °

## 2021-10-17 NOTE — Assessment & Plan Note (Signed)
Suspect hematogenous seeding from prior IVDU Cx (+)MRSA MRI 05/25 see results   Continue abx. Vancomycin --> Daptomycin on  10/15/21    not meeting sepsis criteria as of 10/11/21   Echocardiogram showed NO vegetations on TTE, should not need TEE since won't change IV abx course.   Neurosurgical input via secure chat 05/26: MRI no discrete abscess, and no neurologic deficits to warrant emergent intervention, will continue neurologic checks q shift and prn, neurosurgery advises no intervention / no surgical procedure at this time since unlikely to drain anything, if needed could consider IR to sample   Pain control with toradol and oxycodone   Repeat (+)blood cultures 10/13/21, ID following, have asked neurosurgery to reevaluate (secure chat to [name] 05/29)  Repeat MRI with some progression of infection but neurosurgery would like to continue with conservative management.  Repeat blood cultures were done today-pending results

## 2021-10-18 DIAGNOSIS — B9562 Methicillin resistant Staphylococcus aureus infection as the cause of diseases classified elsewhere: Secondary | ICD-10-CM

## 2021-10-18 DIAGNOSIS — R7881 Bacteremia: Secondary | ICD-10-CM

## 2021-10-18 LAB — GLUCOSE, CAPILLARY: Glucose-Capillary: 84 mg/dL (ref 70–99)

## 2021-10-18 MED ORDER — POLYETHYLENE GLYCOL 3350 17 G PO PACK
17.0000 g | PACK | Freq: Every day | ORAL | Status: DC
  Filled 2021-10-18 (×16): qty 1

## 2021-10-18 NOTE — Assessment & Plan Note (Signed)
Suspect hematogenous seeding from prior IVDU Cx (+)MRSA MRI 05/25 see results   Continue abx. Vancomycin --> Daptomycin on  10/15/21    not meeting sepsis criteria as of 10/11/21   Echocardiogram showed NO vegetations on TTE, should not need TEE since won't change IV abx course.   Neurosurgical input via secure chat 05/26: MRI no discrete abscess, and no neurologic deficits to warrant emergent intervention, will continue neurologic checks q shift and prn, neurosurgery advises no intervention / no surgical procedure at this time since unlikely to drain anything, if needed could consider IR to sample   Pain control with toradol and oxycodone   Repeat (+)blood cultures 10/13/21, ID following, have asked neurosurgery to reevaluate (secure chat to [name] 05/29)  Repeat MRI with some progression of infection but neurosurgery would like to continue with conservative management.  Repeat blood cultures were done yesterday remain negative in 24 hours.  -Continue daptomycin

## 2021-10-18 NOTE — Progress Notes (Signed)
   Date of Admission:  10/10/2021       Subjective: Patient is doing much better Ambulating in the room Able to turn in bed without much pain But still at the end of Toradol tapering he has some pain Appetite better No fever  Medications:   enoxaparin (LOVENOX) injection  40 mg Subcutaneous Q24H   ketorolac  30 mg Intravenous Q6H   lidocaine  2 patch Transdermal Q24H   orphenadrine  60 mg Intravenous Q12H   polyethylene glycol  17 g Oral Daily    Objective: Vital signs in last 24 hours: Temp:  [97.9 F (36.6 C)-98.1 F (36.7 C)] 97.9 F (36.6 C) (06/02 1932) Pulse Rate:  [58-66] 66 (06/02 1932) Resp:  [16-17] 17 (06/02 1932) BP: (103-126)/(65-78) 110/78 (06/02 1932) SpO2:  [86 %-100 %] 100 % (06/02 1932)  PHYSICAL EXAM:  General: Alert, cooperative, no distress at the moment, appears stated age.  Lungs: Clear to auscultation bilaterally. No Wheezing or Rhonchi. No rales. Heart: Regular rate and rhythm, no murmur, rub or gallop. Abdomen: Soft, non-tender,not distended. Bowel sounds normal. No masses Extremities: atraumatic, no cyanosis. No edema. No clubbing Skin: No rashes or lesions. Or bruising Lymph: Cervical, supraclavicular normal. Neurologic: Grossly non-focal  Lab Results Recent Labs    10/17/21 0407  CREATININE 0.68   ESR on 10/14/2021 is 128 CRP is 20.4   Microbiology: 10/10/2021 MRSA and blood culture 10/13/2021 MRSA and blood culture 10/17/2021 blood culture has been sent   Assessment/Plan: MRSA bacteremia with thoracic vertebral and cervical vertebral infection.  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 Seen by neurosurgeon No surgical intervention currently Patient is on dual MRSA coverage with daptomycin and ceftaroline. Repeat blood cultures have been sent Will not be able to place a PICC line until the culture is negative on day 5.   Will need minimum of 6 to 8 weeks of IV  antibiotic.  On discharge will be going only on daptomycin. Discussed the management with the patient and care team. ID will follow him peripherally this weekend call if needed.

## 2021-10-18 NOTE — Progress Notes (Signed)
From Benton Smith's note 10/11/21: History of Present Illness: Pleasant 46 year old man with a history of IV drug abuse.  Has had chronic pain, however had worsening in the past 1 to 2 weeks.  His upper back and shoulder blades.  On evaluation was found to have discitis/osteomyelitis.  Denies any weakness numbness or tingling.  Denies any trouble with his bowel or bladder.  Did not have any new neurologic symptoms.  Mostly just pain in the upper back/lower neck.  10/18/21: Continued back pain. He admits a little improved from yesterday. No one symptoms per patient.   Update: 10/17/2021 He continues to have severe back pain, but no new neurologic symptoms.  Review of Systems:  A 10 point review of systems is negative, except for the pertinent positives and negatives detailed in the HPI.  Past Medical History: Past Medical History:  Diagnosis Date   Back pain    Chronic back pain    Sinusitis     Past Surgical History: History reviewed. No pertinent surgical history.  Allergies: Allergies as of 10/10/2021   (No Known Allergies)    Medications:  Current Facility-Administered Medications:    acetaminophen (TYLENOL) tablet 650 mg, 650 mg, Oral, Q6H PRN, 650 mg at 10/17/21 2211 **OR** acetaminophen (TYLENOL) suppository 650 mg, 650 mg, Rectal, Q6H PRN, Athena Masse, MD   ceftaroline (TEFLARO) 600 mg in sodium chloride 0.9 % 100 mL IVPB, 600 mg, Intravenous, Q8H, Ravishankar, Joellyn Quails, MD, Last Rate: 100 mL/hr at 10/18/21 0513, 600 mg at 10/18/21 0513   cyclobenzaprine (FLEXERIL) tablet 5 mg, 5 mg, Oral, TID PRN, Athena Masse, MD, 5 mg at 10/17/21 2211   DAPTOmycin (CUBICIN) 800 mg in sodium chloride 0.9 % IVPB, 10 mg/kg, Intravenous, Q2000, Tsosie Billing, MD, Last Rate: 132 mL/hr at 10/18/21 1132, 800 mg at 10/18/21 1132   enoxaparin (LOVENOX) injection 40 mg, 40 mg, Subcutaneous, Q24H, Judd Gaudier V, MD, 40 mg at 10/17/21 2216   HYDROcodone-acetaminophen (NORCO) 7.5-325  MG per tablet 1 tablet, 1 tablet, Oral, Q6H PRN, Lorella Nimrod, MD, 1 tablet at 10/18/21 1130   ketorolac (TORADOL) 30 MG/ML injection 30 mg, 30 mg, Intravenous, Q6H, Lorella Nimrod, MD, 30 mg at 10/18/21 0713   lidocaine (LIDODERM) 5 % 2 patch, 2 patch, Transdermal, Q24H, Emeterio Reeve, DO, 2 patch at 10/16/21 1748   ondansetron (ZOFRAN) tablet 4 mg, 4 mg, Oral, Q6H PRN **OR** ondansetron (ZOFRAN) injection 4 mg, 4 mg, Intravenous, Q6H PRN, Athena Masse, MD   orphenadrine (NORFLEX) injection 60 mg, 60 mg, Intravenous, Q12H, Triplett, Cari B, FNP, 60 mg at 10/18/21 0510   polyethylene glycol (MIRALAX / GLYCOLAX) packet 17 g, 17 g, Oral, Daily, Lorella Nimrod, MD   Social History: Social History   Tobacco Use   Smoking status: Every Day   Smokeless tobacco: Never  Substance Use Topics   Alcohol use: Not Currently   Drug use: No    Family Medical History: No family history on file.  Physical Examination: Vitals:   10/18/21 0728 10/18/21 1225  BP: 112/73 109/68  Pulse: 65 (!) 58  Resp:  16  Temp:  98.1 F (36.7 C)  SpO2: (!) 86% 100%     General: Patient is well developed, well nourished, continued with pain.  General: In pain with excess movements. Awake, alert, oriented to person, place, and time.  Pupils equal round and reactive to light.  Facial tone is symmetric.  Tongue protrusion is midline.  There is no pronator drift.  ROM of  spine: Pain to palpation throughout the upper T spine.  Strength: Side Biceps Triceps Deltoid Interossei Grip Wrist Ext. Wrist Flex.  R 5 5 5 5 5 5 5   L 5 5 5 5 5 5 5    Side Iliopsoas Quads Hamstring PF DF EHL  R 5 5 5 5 5 5   L 5 5 5 5 5 5     Bilateral upper and lower extremity sensation is intact to light touch. Reflexes are 2+ and symmetric at the biceps, triceps, brachioradialis, patella and achilles. Hoffman's is absent.  Clonus is not present.  Toes are down-going.    Gait is deferred due to pain  level.  Imaging:  Narrative & Impression  CLINICAL DATA:  Myelopathy, acute, cervical spine; Myelopathy, acute, thoracic spine   EXAM: MRI CERVICAL AND THORACIC SPINE WITHOUT AND WITH CONTRAST   TECHNIQUE: Multiplanar and multiecho pulse sequences of the cervical spine, to include the craniocervical junction and cervicothoracic junction, and the thoracic spine, were obtained without and with intravenous contrast.   CONTRAST:  7.7mL GADAVIST GADOBUTROL 1 MMOL/ML IV SOLN   COMPARISON:  Same day CT chest   FINDINGS: MRI CERVICAL SPINE FINDINGS   Motion degraded exam.   Alignment: Trace anterolisthesis at C2-C3.   Vertebrae: No evidence of acute fracture or aggressive osseous lesion.   Cord: Normal signal and morphology.   Posterior Fossa, vertebral arteries, paraspinal tissues: Preserved vertebral artery flow voids. The posterior fossa is unremarkable. There is soft tissue edema and enhancement along the right posterior paraspinal soft tissues adjacent to the right C2-C3 facet. There is paraspinal soft tissue edema along the upper thoracic spine extending into the anterior lower aspect of the cervical spine to level of C6, described below.   Disc levels: The craniocervical junction is unremarkable.   C2-C3: Trace degenerative anterolisthesis. Advanced right-sided facet arthropathy with cortical irregularity, periarticular marrow edema and enhancement, and a right-sided facet effusion. Mild bilateral neural foraminal stenosis.Minimal spinal canal narrowing. Right sided ventral and lateral epidural phlegmon measuring up to 2 mm in thicknes   C3-C4: Minimal disc bulging. Minimal spinal canal narrowing. Mild bilateral facet arthropathy.Moderate left neural foraminal stenosis. No right neural foraminal stenosis.   C4-C5: Asymmetric right disc bulging. Mild bilateral facet arthropathy. Minimal canal narrowing. Mild right neural foraminal stenosis. No left neural  foraminal stenosis.   C5-C6: Posterior disc osteophyte complex and uncovertebral joint hypertrophy. Mild spinal canal stenosis. Moderate bilateral neural foraminal stenosis, left greater than right.   C6-C7: Posterior disc osteophyte complex. Minimal canal narrowing. Mild bilateral neural foraminal stenosis.   C7-T1: No significant spinal canal or neural foraminal narrowing.   MRI THORACIC SPINE FINDINGS   Alignment:  Physiologic.   Vertebrae: There is endplate irregularity, marrow edema and enhancement at T2-T3 with minimally increased abnormal signal in the intervertebral disc. Focal T1 and T2 hyperintense lesion within the posterior aspect of the T10 vertebral body on the left near the pedicle with central signal dropout on STIR likely an atypical hemangioma. Scattered additional degenerative endplate changes.   Cord: No definite abnormal cord signal.   Paraspinal and other soft tissues: There is extensive anterior and lateral paraspinal soft tissue edema and enhancement spanning T2 through T6 consistent with phlegmon. There is thin phlegmonous extension into the anterior paraspinal soft tissues of the lower cervical spine to the level of C6 (cervical spine MRI sagittal postcontrast image 3).   Disc levels: There is disc bulging at T2-T3, asymmetric right resulting in moderate to severe right-sided neural foraminal  stenosis. There is ventral and dorsal epidural phlegmon spanning from T2-T4 ventrally and T1-T5 dorsally, measuring up to 2-3 mm and thickness.   There is multilevel disc desiccation throughout the thoracic spine, with additional mild disc bulging at T3-T4 resulting in mild bilateral neural foraminal stenosis and minimal disc bulging at T10-T11 and T11-T12 resulting in no significant stenosis.   IMPRESSION: Discitis-osteomyelitis at T2-T3 with extensive adjacent anterior and lateral paraspinal phlegmon extending superiorly to the lower cervical spine at the  level of C6 and inferiorly to the level of T6. There is also adjacent dorsal and ventral epidural phlegmon measuring up to 2-3 mm in thickness, spanning from T2-T4 ventrally and T1-T5 dorsally.   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 Right sided ventral and lateral epidural phlegmon at C2-C3 measuring up to 2 mm in thickness.   Degenerative changes in the cervical and thoracic spine as described above.     Electronically Signed   By: Maurine Simmering M.D.   On: 10/10/2021 22:02   MRI CT spine  10/15/21 IMPRESSION: MRI CERVICAL SPINE IMPRESSION:   1. Findings consistent with persistent acute septic arthritis involving the right C2-3 facet, progressed from previous. Associated paraspinous soft tissue inflammation with a few small micro abscesses as above. Minimal adjacent epidural enhancement without discrete epidural abscess or significant spinal stenosis. 2. Underlying multilevel cervical spondylosis with resultant mild spinal stenosis at C5-6 and C6-7.   MRI THORACIC SPINE IMPRESSION:   1. Progressive changes of osteomyelitis discitis at T2-3, with new involvement about the adjacent T3-4 interspace. Associated epidural phlegmon/abscess measuring up to 5 mm in maximal AP diameter at the level of T3 with associated mild spinal stenosis, progressed. 2. Associated paraspinous phlegmon and inflammation at T2-3 and T3-4 with a few probable superimposed soft tissue micro abscesses as above, also mildly progressed. 3. Probable tiny syrinx at the level of T4-5, stable. 4. Small layering bilateral pleural effusions.     Electronically Signed   By: Jeannine Boga M.D.   On: 10/16/2021 05:33  I have personally reviewed the images and agree with the above interpretation.  Labs:    Latest Ref Rng & Units 10/15/2021    2:20 PM 10/14/2021    2:46  AM 10/12/2021    4:23 AM  CBC  WBC 4.0 - 10.5 K/uL 13.3   11.7   13.0    Hemoglobin 13.0 - 17.0 g/dL 11.9   10.7   12.3    Hematocrit 39.0 - 52.0 % 35.6   31.4   36.1    Platelets 150 - 400 K/uL 574   398   296         Assessment and Plan: Mr. Bartels is a pleasant 46 y.o. 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  - patient remains neurologically intact and reports improved pain since yesterday. -Continue with antibiotic treatment. -Remainder of care per internal medicine's recommendations -Plan for neurosurgical intervention at this time -Please call with any questions or concerns.  Cooper Render PA-C Dept. of Neurosurgery

## 2021-10-18 NOTE — Progress Notes (Signed)
Progress Note   Patient: Cameron Hammond IZT:[id number] DOB: 29-May-1975 DOA: 10/10/2021     8 DOS: the patient was seen and examined on 10/18/2021   Brief hospital course: Taken from prior notes.  Cameron Hammond is a 46 year old male PMH back pain, IVDU last use 6 months prior to admission (varying history on this).  Presented to ED 10/10/21 complaining of pain in head, neck, upper back x1 week, no relief with chiropractor. [name] 05/25: In ED CT head/cervical spine and CXR negative, CTA chest no PE, paraspinal soft tissue thickening extending from T2-T5 concerning for osteomyelitis.  WBC 17.3.  Started on vancomycin, Zosyn.  Vital signs initially normal, later transient tachycardia/tachypnea, presumably due to pain, requiring IV Dilaudid for pain control.   ? MRI thoracic and cervical spine: Discitis/osteomyelitis at 2 to T3, extensive adjacent anterior and lateral paraspinal phlegmon extending to C6 and to T6, dorsal and ventral phlegmon T2-T4 ventrally and T1-T5 dorsally, probable septic arthritis of the right C2-C3 facet joint with adjacent facet osteomyelitis and right paraspinal soft tissue inflammation, right sided ventral and lateral phlegmon C2-C3.   ? MRSA on blood culture 3 of 4 bottles, Zosyn was d/c, vancomycin was continued. [name] 05/26: WBC trending down to 15.4. ID advising neurosurgery consult to eval for washout/procedure to reduce bioburden. TEE may not be needed since will not change therapy of at least 6 weeks abx IV. Repeat BCx in 48 hours to eval for clearance. Neurosurgery reviewed and communicated w/ me via secure chat, no plan at this point for surgical intervention.  [name] 05/27: pt reporting pain, see nurse notes from overnight. Otherwise remains of IV abx, ID to peripherally follow through the weekend (today is Saturday, Monday is Memorial Day). UTox (+)opiates (from inpatient administration meds), cannabinoids, tricyclics.  [name] 05/28: pain better on scheduled Norco 10 q6h, repeat cultures  pending . 05/29: (+) repeat blood cultures, ID following, changed abx to daptomycin. I have asked neurosurgery to reevaluate ([name]) . 05/30: No plans for NS or IR to go in and wash/debride. Contineu IV antibitoics per ID and repeat cultures per ID. May need long term IV Abx vs repeat imaging if persistent (+)Cx  5/31: [name].  Norco was added with Toradol.  ID is planning to repeat blood cultures tomorrow and if remain positive we will reimage.  6/1: Repeat blood cultures were obtained today.  MRI repeated yesterday which shows some progression of septic arthritis, discitis and osteomyelitis with some microabscesses. Per neurosurgery he still does not have any indication for surgical intervention and they were recommending continuing medical therapy.  6/2: Repeat blood cultures done yesterday remain negative in 24 hours.  Pain seems to be well controlled with Toradol and Norco.  No surgical intervention required at this time per neurosurgery.  Patient will remain in hospital for a long time to complete at least a 6 weeks course of IV antibiotics.   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 Vancomycin --> Daptomycin on  10/15/21    not meeting sepsis criteria as of 10/11/21   Echocardiogram showed NO vegetations on TTE, should not need TEE since won't change IV abx course.   Neurosurgical input via secure chat 05/26: MRI no discrete abscess, and no neurologic deficits to warrant emergent intervention, will continue neurologic checks q shift and prn, neurosurgery advises  no intervention / no surgical procedure at this time since unlikely to drain anything, if needed could consider IR to sample   Pain control with toradol and oxycodone   Repeat (+)blood cultures 10/13/21, ID  following, have asked neurosurgery to reevaluate (secure chat to [name] 05/29)  Repeat MRI with some progression of infection but neurosurgery would like to continue with conservative management.  Repeat blood cultures were done yesterday remain negative in 24 hours.  -Continue daptomycin  Septic arthritis of cervical spine (HCC) Management as above  History of intravenous drug abuse (HCC) States last use was six months ago  Counseled on abstinence  Hypokalemia Resolved. -Continue to monitor and replete as needed   Subjective: Patient continued to have upper back pain but stating that current regimen keeping it tolerable.  He wants to continue with Toradol.  Physical Exam: Vitals:   10/17/21 2002 10/18/21 0507 10/18/21 0728 10/18/21 1225  BP: 117/63 103/65 112/73 109/68  Pulse: 71 65 65 (!) 58  Resp: 17 17  16   Temp: 99.1 F (37.3 C) 97.9 F (36.6 C)  98.1 F (36.7 C)  TempSrc:  Oral    SpO2: 98% 100% (!) 86% 100%  Weight:      Height:       General.  Well-developed gentleman, in no acute distress. Pulmonary.  Lungs clear bilaterally, normal respiratory effort. CV.  Regular rate and rhythm, no JVD, rub or murmur. Abdomen.  Soft, nontender, nondistended, BS positive. CNS.  Alert and oriented .  No focal neurologic deficit. Extremities.  No edema, no cyanosis, pulses intact and symmetrical. Psychiatry.  Judgment and insight appears normal.  Data Reviewed: Prior notes and labs reviewed  Family Communication: Discussed with patient  Disposition: Status is: Inpatient Remains inpatient appropriate because: Severity of illness   Planned Discharge Destination: Home  DVT prophylaxis.  Lovenox Time spent: 45 minutes  This record has been created using . Errors have been sought and corrected,but may not always be located. Such creation errors do not reflect on the standard of care.  Author: Conservation officer, historic buildings, MD 10/18/2021 1:42  PM  For on call review [email or web address].

## 2021-10-18 NOTE — Progress Notes (Signed)
Pharmacy Antibiotic Note  Cameron Hammond is a 46 y.o. male admitted on 10/10/2021 with sepsis d/t MRSA bacteremia.  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   Plan: -Continue Daptomycin 800mg  (10 mg/kg) (TBW) IV Q24H  Follow CK weekly  -continue Ceftatoline 600mg  IV q8h added by ID on 5/31   Height: 5\' 7"  (170.2 cm) Weight: 79.4 kg (175 lb) IBW/kg (Calculated) : 66.1  Temp (24hrs), Avg:98.4 F (36.9 C), Min:97.9 F (36.6 C), Max:99.1 F (37.3 C)  Recent Labs  Lab 10/12/21 0423 10/13/21 1819 10/14/21 0246 10/15/21 0402 10/15/21 1420 10/17/21 0407  WBC 13.0*  --  11.7*  --  13.3*  --   CREATININE 0.68  --  0.67 0.54* 0.82 0.68  VANCOTROUGH  --   --  8*  --   --   --   VANCOPEAK  --  28*  --   --   --   --      Estimated Creatinine Clearance: 117.8 mL/min (by C-G formula based on SCr of 0.68 mg/dL).    No Known Allergies  Antimicrobials this admission: Zosyn 5/25>>5/25 vancomycin 5/25 >> 5/29 5/30 Daptomycin >>  Ceftaroline 5/31>>  Microbiology results: 5/25 Bcx 4of4 bottles>> MRSA  5/28 3of4: still MRSA +  MRI : Discitis-osteomyelitis at T2-T3,  with extensive adjacent anterior and lateral paraspinal phlegmon  Thank you for allowing pharmacy to be a part of this patient's care.  6/30, PharmD Clinical Pharmacist   10/18/2021 2:46 PM

## 2021-10-19 LAB — CREATININE, SERUM
Creatinine, Ser: 0.71 mg/dL (ref 0.61–1.24)
GFR, Estimated: >60 mL/min (ref >60)

## 2021-10-19 MED ORDER — KETOROLAC TROMETHAMINE 30 MG/ML IJ SOLN
30.0000 mg | Freq: Three times a day (TID) | INTRAMUSCULAR | Status: DC | PRN
  Administered 2021-10-19 – 2021-10-21 (×4): 30 mg via INTRAVENOUS
  Filled 2021-10-19 (×5): qty 1

## 2021-10-19 NOTE — Progress Notes (Signed)
Progress Note   Patient: Cameron Hammond:[id number] DOB: 1975/06/11 DOA: 10/10/2021     9 DOS: the patient was seen and examined on 10/19/2021   Brief hospital course: Taken from prior notes.  Mr. Guedes is a 46 year old male PMH back pain, IVDU last use 6 months prior to admission (varying history on this).  Presented to ED 10/10/21 complaining of pain in head, neck, upper back x1 week, no relief with chiropractor. 05/25: In ED CT head/cervical spine and CXR negative, CTA chest no PE, paraspinal soft tissue thickening extending from T2-T5 concerning for osteomyelitis.  WBC 17.3.  Started on vancomycin, Zosyn.  Vital signs initially normal, later transient tachycardia/tachypnea, presumably due to pain, requiring IV Dilaudid for pain control.   MRI thoracic and cervical spine: Discitis/osteomyelitis at 2 to T3, extensive adjacent anterior and lateral paraspinal phlegmon extending to C6 and to T6, dorsal and ventral phlegmon T2-T4 ventrally and T1-T5 dorsally, probable septic arthritis of the right C2-C3 facet joint with adjacent facet osteomyelitis and right paraspinal soft tissue inflammation, right sided ventral and lateral phlegmon C2-C3.   MRSA on blood culture 3 of 4 bottles, Zosyn was d/c, vancomycin was continued. 05/26: WBC trending down to 15.4. ID advising neurosurgery consult to eval for washout/procedure to reduce bioburden. TEE may not be needed since will not change therapy of at least 6 weeks abx IV. Repeat BCx in 48 hours to eval for clearance. Neurosurgery reviewed and communicated w/ me via secure chat, no plan at this point for surgical intervention.  05/27: pt reporting pain, see nurse notes from overnight. Otherwise remains of IV abx, ID to peripherally follow through the weekend (today is Saturday, Monday is Memorial Day). UTox (+)opiates (from inpatient administration meds), cannabinoids, tricyclics.  05/28: pain better on scheduled Norco 10 q6h, repeat cultures pending 05/29: (+)  repeat blood cultures, ID following, changed abx to daptomycin. I have asked neurosurgery to reevaluate ([name]) 05/30: No plans for NS or IR to go in and wash/debride. Contineu IV antibitoics per ID and repeat cultures per ID. May need long term IV Abx vs repeat imaging if persistent (+)Cx  5/31: [name].  Norco was added with Toradol.  ID is planning to repeat blood cultures tomorrow and if remain positive we will reimage.  6/1: Repeat blood cultures were obtained today.  MRI repeated yesterday which shows some progression of septic arthritis, discitis and osteomyelitis with some microabscesses. Per neurosurgery he still does not have any indication for surgical intervention and they were recommending continuing medical therapy.  6/2: Repeat blood cultures done yesterday remain negative in 24 hours.  Pain seems to be well controlled with Toradol and Norco.  No surgical intervention required at this time per neurosurgery.  Patient will remain in hospital for a long time to complete at least a 6 weeks course of IV antibiotics.   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 Vancomycin --> Daptomycin on  10/15/21   not meeting sepsis criteria as of 10/11/21  Echocardiogram showed NO vegetations on TTE, should not need TEE since won't change IV abx course.  Neurosurgical input via secure chat 05/26: MRI no discrete abscess, and no neurologic deficits to warrant emergent intervention, will continue neurologic checks q shift and prn, neurosurgery advises no intervention / no surgical procedure at this time since unlikely to  drain anything, if needed could consider IR to sample  Pain control with toradol and oxycodone  Repeat (+)blood cultures 10/13/21, ID following, have asked neurosurgery to  reevaluate (secure chat to [name] 05/29) Repeat MRI with some progression of infection but neurosurgery would like to continue with conservative management. Repeat blood cultures were done yesterday remain negative in 24 hours. -Continue daptomycin  Septic arthritis of cervical spine (HCC) Management as above  History of intravenous drug abuse (HCC) States last use was six months ago Counseled on abstinence  Hypokalemia Resolved. -Continue to monitor and replete as needed   Subjective: NAEON. Tolerating Abx well. Denies pain today. Resting in bed. NAD  Physical Exam: Vitals:   10/18/21 1550 10/18/21 1932 10/19/21 0534 10/19/21 0835  BP: 126/73 110/78 125/76 118/72  Pulse: 66 66 60 (!) 58  Resp: 16 17 18 14   Temp: 98 F (36.7 C) 97.9 F (36.6 C) 98 F (36.7 C) 98.3 F (36.8 C)  TempSrc: Oral     SpO2: 99% 100% 100% 99%  Weight:      Height:       General.  Well-developed gentleman, in no acute distress. Pulmonary.  Lungs clear bilaterally, normal respiratory effort. CV.  Regular rate and rhythm, no JVD, rub or murmur. Abdomen.  Soft, nontender, nondistended, BS positive. CNS.  Alert and oriented .  No focal neurologic deficit. Extremities.  No edema, no cyanosis, pulses intact and symmetrical. Psychiatry.  Judgment and insight appears normal.  Data Reviewed: Prior notes and labs reviewed  Family Communication: Discussed with patient  Disposition: Status is: Inpatient Remains inpatient appropriate because: Severity of illness   Planned Discharge Destination: Home  DVT prophylaxis.  Lovenox Time spent: 45 minutes  This record has been created using . Errors have been sought and corrected,but may not always be located. Such creation errors do not reflect on the standard of care.  Author: Conservation officer, historic buildings, MD 10/19/2021 11:22 AM  For on call review [email or web address].

## 2021-10-19 NOTE — Plan of Care (Signed)

## 2021-10-20 LAB — C-REACTIVE PROTEIN: CRP: 3 mg/dL — ABNORMAL HIGH (ref <1.0)

## 2021-10-20 LAB — BASIC METABOLIC PANEL
Anion gap: 7 (ref 5–15)
BUN: 13 mg/dL (ref 6–20)
CO2: 27 mmol/L (ref 22–32)
Calcium: 9 mg/dL (ref 8.9–10.3)
Chloride: 107 mmol/L (ref 98–111)
Creatinine, Ser: 0.86 mg/dL (ref 0.61–1.24)
GFR, Estimated: >60 mL/min (ref >60)
Glucose, Bld: 90 mg/dL (ref 70–99)
Potassium: 4.4 mmol/L (ref 3.5–5.1)
Sodium: 141 mmol/L (ref 135–145)

## 2021-10-20 MED ORDER — HYDROCODONE-ACETAMINOPHEN 10-325 MG PO TABS
1.0000 | ORAL_TABLET | Freq: Four times a day (QID) | ORAL | Status: DC | PRN
  Administered 2021-10-20 – 2021-10-21 (×2): 1 via ORAL
  Filled 2021-10-20 (×2): qty 1

## 2021-10-20 NOTE — Progress Notes (Signed)
Mobility Specialist - Progress Note   10/20/21 1600  Mobility  Activity Ambulated independently in hallway  Level of Assistance Independent  Distance Ambulated (ft) 320 ft  Activity Response Tolerated well  $Mobility charge 1 Mobility     Pt ambulates in hallway indep voicing no complaints and returns to recliner with needs in reach.  Clarisa Schools Mobility Specialist 10/20/21, 4:40 PM

## 2021-10-20 NOTE — Consult Note (Signed)
From Cameron Hammond's note 10/11/21: History of Present Illness: Pleasant 46 year old man with a history of IV drug abuse.  Has had chronic pain, however had worsening in the past 1 to 2 weeks.  His upper back and shoulder blades.  On evaluation was found to have discitis/osteomyelitis.  Denies any weakness numbness or tingling.  Denies any trouble with his bowel or bladder.  Did not have any new neurologic symptoms.  Mostly just pain in the upper back/lower neck.  Update: 10/17/2021 He continues to have back and neck pain but much better than last week. He states that he has had a massive improvement after his new antibiotics. No new neurologic issues.   Review of Systems:  A 10 point review of systems is negative, except for the pertinent positives and negatives detailed in the HPI.  Past Medical History: Past Medical History:  Diagnosis Date   Back pain    Chronic back pain    Sinusitis     Past Surgical History: History reviewed. No pertinent surgical history.  Allergies: Allergies as of 10/10/2021   (No Known Allergies)    Medications:  Current Facility-Administered Medications:    acetaminophen (TYLENOL) tablet 650 mg, 650 mg, Oral, Q6H PRN, 650 mg at 10/17/21 2211 **OR** acetaminophen (TYLENOL) suppository 650 mg, 650 mg, Rectal, Q6H PRN, Andris Baumann, MD   ceftaroline (TEFLARO) 600 mg in sodium chloride 0.9 % 100 mL IVPB, 600 mg, Intravenous, Q8H, Ravishankar, Rhodia Albright, MD, Last Rate: 100 mL/hr at 10/20/21 0611, 600 mg at 10/20/21 9983   cyclobenzaprine (FLEXERIL) tablet 5 mg, 5 mg, Oral, TID PRN, Andris Baumann, MD, 5 mg at 10/20/21 0400   DAPTOmycin (CUBICIN) 800 mg in sodium chloride 0.9 % IVPB, 10 mg/kg, Intravenous, Q2000, Lynn Ito, MD, Stopped at 10/19/21 1110   enoxaparin (LOVENOX) injection 40 mg, 40 mg, Subcutaneous, Q24H, Andris Baumann, MD, 40 mg at 10/18/21 2217   HYDROcodone-acetaminophen (NORCO) 7.5-325 MG per tablet 1 tablet, 1 tablet, Oral,  Q6H PRN, Arnetha Courser, MD, 1 tablet at 10/19/21 2213   ketorolac (TORADOL) 30 MG/ML injection 30 mg, 30 mg, Intravenous, Q8H PRN, Guss Bunde, Aseem, MD, 30 mg at 10/20/21 0400   lidocaine (LIDODERM) 5 % 2 patch, 2 patch, Transdermal, Q24H, Sunnie Nielsen, DO, 2 patch at 10/16/21 1748   ondansetron (ZOFRAN) tablet 4 mg, 4 mg, Oral, Q6H PRN **OR** ondansetron (ZOFRAN) injection 4 mg, 4 mg, Intravenous, Q6H PRN, Andris Baumann, MD   orphenadrine (NORFLEX) injection 60 mg, 60 mg, Intravenous, Q12H, Triplett, Cari B, FNP, 60 mg at 10/20/21 0607   polyethylene glycol (MIRALAX / GLYCOLAX) packet 17 g, 17 g, Oral, Daily, Arnetha Courser, MD   Social History: Social History   Tobacco Use   Smoking status: Every Day   Smokeless tobacco: Never  Substance Use Topics   Alcohol use: Not Currently   Drug use: No    Family Medical History: No family history on file.  Physical Examination: Vitals:   10/20/21 0411 10/20/21 0852  BP: 116/77 113/71  Pulse: 73 63  Resp: 18 16  Temp: 97.9 F (36.6 C) 98.3 F (36.8 C)  SpO2: 100% 99%     General: Patient is well developed, well nourished, continued with pain.  General: In pain with excess movements. Awake, alert, oriented to person, place, and time.  Pupils equal round and reactive to light.  Facial tone is symmetric.  Tongue protrusion is midline.  There is no pronator drift.  ROM of spine: Pain to palpation  throughout the upper T spine.  Strength: Side Biceps Triceps Deltoid Interossei Grip Wrist Ext. Wrist Flex.  R 5 5 5 5 5 5 5   L 5 5 5 5 5 5 5    Side Iliopsoas Quads Hamstring PF DF EHL  R 5 5 5 5 5 5   L 5 5 5 5 5 5     Bilateral upper and lower extremity sensation is intact to light touch. Reflexes are 2+ and symmetric at the biceps, triceps, brachioradialis, patella and achilles. Hoffman's is absent.  Clonus is not present.  Toes are down-going.    Gait is deferred due to pain level.  Imaging:  Narrative & Impression   CLINICAL DATA:  Myelopathy, acute, cervical spine; Myelopathy, acute, thoracic spine   EXAM: MRI CERVICAL AND THORACIC SPINE WITHOUT AND WITH CONTRAST   TECHNIQUE: Multiplanar and multiecho pulse sequences of the cervical spine, to include the craniocervical junction and cervicothoracic junction, and the thoracic spine, were obtained without and with intravenous contrast.   CONTRAST:  7.605mL GADAVIST GADOBUTROL 1 MMOL/ML IV SOLN   COMPARISON:  Same day CT chest   FINDINGS: MRI CERVICAL SPINE FINDINGS   Motion degraded exam.   Alignment: Trace anterolisthesis at C2-C3.   Vertebrae: No evidence of acute fracture or aggressive osseous lesion.   Cord: Normal signal and morphology.   Posterior Fossa, vertebral arteries, paraspinal tissues: Preserved vertebral artery flow voids. The posterior fossa is unremarkable. There is soft tissue edema and enhancement along the right posterior paraspinal soft tissues adjacent to the right C2-C3 facet. There is paraspinal soft tissue edema along the upper thoracic spine extending into the anterior lower aspect of the cervical spine to level of C6, described below.   Disc levels: The craniocervical junction is unremarkable.   C2-C3: Trace degenerative anterolisthesis. Advanced right-sided facet arthropathy with cortical irregularity, periarticular marrow edema and enhancement, and a right-sided facet effusion. Mild bilateral neural foraminal stenosis.Minimal spinal canal narrowing. Right sided ventral and lateral epidural phlegmon measuring up to 2 mm in thicknes   C3-C4: Minimal disc bulging. Minimal spinal canal narrowing. Mild bilateral facet arthropathy.Moderate left neural foraminal stenosis. No right neural foraminal stenosis.   C4-C5: Asymmetric right disc bulging. Mild bilateral facet arthropathy. Minimal canal narrowing. Mild right neural foraminal stenosis. No left neural foraminal stenosis.   C5-C6: Posterior disc  osteophyte complex and uncovertebral joint hypertrophy. Mild spinal canal stenosis. Moderate bilateral neural foraminal stenosis, left greater than right.   C6-C7: Posterior disc osteophyte complex. Minimal canal narrowing. Mild bilateral neural foraminal stenosis.   C7-T1: No significant spinal canal or neural foraminal narrowing.   MRI THORACIC SPINE FINDINGS   Alignment:  Physiologic.   Vertebrae: There is endplate irregularity, marrow edema and enhancement at T2-T3 with minimally increased abnormal signal in the intervertebral disc. Focal T1 and T2 hyperintense lesion within the posterior aspect of the T10 vertebral body on the left near the pedicle with central signal dropout on STIR likely an atypical hemangioma. Scattered additional degenerative endplate changes.   Cord: No definite abnormal cord signal.   Paraspinal and other soft tissues: There is extensive anterior and lateral paraspinal soft tissue edema and enhancement spanning T2 through T6 consistent with phlegmon. There is thin phlegmonous extension into the anterior paraspinal soft tissues of the lower cervical spine to the level of C6 (cervical spine MRI sagittal postcontrast image 3).   Disc levels: There is disc bulging at T2-T3, asymmetric right resulting in moderate to severe right-sided neural foraminal stenosis. There is ventral  and dorsal epidural phlegmon spanning from T2-T4 ventrally and T1-T5 dorsally, measuring up to 2-3 mm and thickness.   There is multilevel disc desiccation throughout the thoracic spine, with additional mild disc bulging at T3-T4 resulting in mild bilateral neural foraminal stenosis and minimal disc bulging at T10-T11 and T11-T12 resulting in no significant stenosis.   IMPRESSION: Discitis-osteomyelitis at T2-T3 with extensive adjacent anterior and lateral paraspinal phlegmon extending superiorly to the lower cervical spine at the level of C6 and inferiorly to the level of  T6. There is also adjacent dorsal and ventral epidural phlegmon measuring up to 2-3 mm in thickness, spanning from T2-T4 ventrally and T1-T5 dorsally.   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 Right sided ventral and lateral epidural phlegmon at C2-C3 measuring up to 2 mm in thickness.   Degenerative changes in the cervical and thoracic spine as described above.     Electronically Signed   By: Caprice Renshaw M.D.   On: 10/10/2021 22:02   MRI CT spine  10/15/21 IMPRESSION: MRI CERVICAL SPINE IMPRESSION:   1. Findings consistent with persistent acute septic arthritis involving the right C2-3 facet, progressed from previous. Associated paraspinous soft tissue inflammation with a few small micro abscesses as above. Minimal adjacent epidural enhancement without discrete epidural abscess or significant spinal stenosis. 2. Underlying multilevel cervical spondylosis with resultant mild spinal stenosis at C5-6 and C6-7.   MRI THORACIC SPINE IMPRESSION:   1. Progressive changes of osteomyelitis discitis at T2-3, with new involvement about the adjacent T3-4 interspace. Associated epidural phlegmon/abscess measuring up to 5 mm in maximal AP diameter at the level of T3 with associated mild spinal stenosis, progressed. 2. Associated paraspinous phlegmon and inflammation at T2-3 and T3-4 with a few probable superimposed soft tissue micro abscesses as above, also mildly progressed. 3. Probable tiny syrinx at the level of T4-5, stable. 4. Small layering bilateral pleural effusions.     Electronically Signed   By: Rise Mu M.D.   On: 10/16/2021 05:33  I have personally reviewed the images and agree with the above interpretation.  Labs:    Latest Ref Rng & Units 10/15/2021    2:20 PM 10/14/2021    2:46 AM 10/12/2021    4:23 AM  CBC  WBC 4.0 -  10.5 K/uL 13.3   11.7   13.0    Hemoglobin 13.0 - 17.0 g/dL 26.7   12.4   58.0    Hematocrit 39.0 - 52.0 % 35.6   31.4   36.1    Platelets 150 - 400 K/uL 574   398   296         Assessment and Plan: Cameron Hammond is a pleasant 46 y.o. 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 At this point, he does not have an indication for surgical intervention.  I recommend continue medical therapy.    I reviewed with him that he is likely to have continued pain, and may have chronic pain after his infection has cleared.  I am hopeful that his infection will improve with the current medical regimen.  Lovenia Kim, MD [facility] Dept. Of Neurosurgery

## 2021-10-20 NOTE — Progress Notes (Signed)
Progress Note   Patient: Cameron Hammond ERX:[id number] DOB: Oct 11, 1975 DOA: 10/10/2021     10 DOS: the patient was seen and examined on 10/20/2021   Brief hospital course: Taken from prior notes.  Mr. Straughter is a 46 year old male PMH back pain, IVDU last use 6 months prior to admission (varying history on this).  Presented to ED 10/10/21 complaining of pain in head, neck, upper back x1 week, no relief with chiropractor. 05/25: In ED CT head/cervical spine and CXR negative, CTA chest no PE, paraspinal soft tissue thickening extending from T2-T5 concerning for osteomyelitis.  WBC 17.3.  Started on vancomycin, Zosyn.  Vital signs initially normal, later transient tachycardia/tachypnea, presumably due to pain, requiring IV Dilaudid for pain control.   MRI thoracic and cervical spine: Discitis/osteomyelitis at 2 to T3, extensive adjacent anterior and lateral paraspinal phlegmon extending to C6 and to T6, dorsal and ventral phlegmon T2-T4 ventrally and T1-T5 dorsally, probable septic arthritis of the right C2-C3 facet joint with adjacent facet osteomyelitis and right paraspinal soft tissue inflammation, right sided ventral and lateral phlegmon C2-C3.   MRSA on blood culture 3 of 4 bottles, Zosyn was d/c, vancomycin was continued. 05/26: WBC trending down to 15.4. ID advising neurosurgery consult to eval for washout/procedure to reduce bioburden. TEE may not be needed since will not change therapy of at least 6 weeks abx IV. Repeat BCx in 48 hours to eval for clearance. Neurosurgery reviewed and communicated w/ me via secure chat, no plan at this point for surgical intervention.  05/27: pt reporting pain, see nurse notes from overnight. Otherwise remains of IV abx, ID to peripherally follow through the weekend (today is Saturday, Monday is Memorial Day). UTox (+)opiates (from inpatient administration meds), cannabinoids, tricyclics.  05/28: pain better on scheduled Norco 10 q6h, repeat cultures pending 05/29: (+)  repeat blood cultures, ID following, changed abx to daptomycin. I have asked neurosurgery to reevaluate ([name]) 05/30: No plans for NS or IR to go in and wash/debride. Contineu IV antibitoics per ID and repeat cultures per ID. May need long term IV Abx vs repeat imaging if persistent (+)Cx  5/31: [name].  Norco was added with Toradol.  ID is planning to repeat blood cultures tomorrow and if remain positive we will reimage.  6/1: Repeat blood cultures were obtained today.  MRI repeated yesterday which shows some progression of septic arthritis, discitis and osteomyelitis with some microabscesses. Per neurosurgery he still does not have any indication for surgical intervention and they were recommending continuing medical therapy.  6/2: Repeat blood cultures done yesterday remain negative in 24 hours.  Pain seems to be well controlled with Toradol and Norco.  No surgical intervention required at this time per neurosurgery.  Patient will remain in hospital for a long time to complete at least a 6 weeks course of IV antibiotics.  6/3: BCX 6/1 NGRD. No change in care plan.    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 Vancomycin --> Daptomycin on  10/15/21   not meeting sepsis criteria as of 10/11/21  Echocardiogram showed NO vegetations on TTE, should not need TEE since won't change IV abx course.  Neurosurgical input via secure chat 05/26: MRI no discrete abscess, and no neurologic deficits to warrant emergent intervention, will continue neurologic checks q shift and prn, neurosurgery advises no  intervention / no surgical procedure at this time since unlikely to drain anything, if needed could consider IR to sample  Pain control with toradol and oxycodone  Repeat (+)blood cultures  10/13/21, ID following, have asked neurosurgery to reevaluate (secure chat to [name] 05/29) Repeat MRI with some progression of infection but neurosurgery would like to continue with conservative management. Repeat blood cultures were done yesterday remain negative in 24 hours. -Continue daptomycin  Septic arthritis of cervical spine (HCC) Management as above  History of intravenous drug abuse (HCC) States last use was six months ago Counseled on abstinence  Hypokalemia Resolved. -Continue to monitor and replete as needed   Subjective: Tells me he would like to trial 7.5mg -->10mg  hydrocodone x 1-2 days while he is weaned off Toradol. He tells me that Toradol works the best but understands possibility of AKI associated with this medication. I set expectation that his back pain will heal with anti inflammatories and PT vs narcotic medication. I also set expectation his inflammation will heal over time and it's in his best interest to wean off narcotics, especially in setting of narcotic abuse in the past.   Physical Exam: Vitals:   10/19/21 2107 10/20/21 0411 10/20/21 0852 10/20/21 1202  BP: (!) 106/59 116/77 113/71 119/71  Pulse: 66 73 63 63  Resp: 18 18 16 18   Temp: 98.2 F (36.8 C) 97.9 F (36.6 C) 98.3 F (36.8 C) 98.1 F (36.7 C)  TempSrc:  Oral    SpO2: 99% 100% 99% 99%  Weight:    74.3 kg  Height:       General.  Well-developed gentleman, in no acute distress. Pulmonary.  Lungs clear bilaterally, normal respiratory effort. CV.  Regular rate and rhythm, no JVD, rub or murmur. Abdomen.  Soft, nontender, nondistended, BS positive. CNS.  Alert and oriented .  No focal neurologic deficit. Extremities.  No edema, no cyanosis, pulses intact and symmetrical. Psychiatry.  Judgment and insight appears normal.  Data Reviewed: Prior notes and labs reviewed  Family Communication: Discussed with patient  Disposition: Status is: Inpatient Remains inpatient  appropriate because: Severity of illness   Planned Discharge Destination: Home  DVT prophylaxis.  Lovenox Time spent: 45 minutes  This record has been created using . Errors have been sought and corrected,but may not always be located. Such creation errors do not reflect on the standard of care.  Author: Conservation officer, historic buildings, MD 10/20/2021 12:25 PM  For on call review [email or web address].

## 2021-10-21 LAB — CK: Total CK: 39 U/L — ABNORMAL LOW (ref 49–397)

## 2021-10-21 MED ORDER — KETOROLAC TROMETHAMINE 30 MG/ML IJ SOLN
30.0000 mg | Freq: Three times a day (TID) | INTRAMUSCULAR | Status: DC | PRN
  Administered 2021-10-21 – 2021-10-24 (×7): 30 mg via INTRAVENOUS
  Filled 2021-10-21 (×7): qty 1

## 2021-10-21 MED ORDER — HYDROCODONE-ACETAMINOPHEN 10-325 MG PO TABS
1.0000 | ORAL_TABLET | ORAL | Status: DC | PRN
  Administered 2021-10-21 – 2021-10-25 (×16): 1 via ORAL
  Filled 2021-10-21 (×16): qty 1

## 2021-10-21 NOTE — Assessment & Plan Note (Signed)
Management as above °

## 2021-10-21 NOTE — Assessment & Plan Note (Signed)
Suspect hematogenous seeding from prior IVDU Cx (+)MRSA MRI 05/25 see results   Continue abx. Vancomycin --> Daptomycin on  10/15/21    not meeting sepsis criteria as of 10/11/21   Echocardiogram showed NO vegetations on TTE, should not need TEE since won't change IV abx course.   Neurosurgical input via secure chat 05/26: MRI no discrete abscess, and no neurologic deficits to warrant emergent intervention, will continue neurologic checks q shift and prn, neurosurgery advises no intervention / no surgical procedure at this time since unlikely to drain anything, if needed could consider IR to sample   Pain control with toradol and oxycodone   Repeat (+)blood cultures 10/13/21, ID following, have asked neurosurgery to reevaluate (secure chat to [name] 05/29)  Repeat MRI with some progression of infection but neurosurgery would like to continue with conservative management.  Repeat blood cultures were done 6/1 remain negative in 24 hours.  -Continue daptomycin

## 2021-10-21 NOTE — Progress Notes (Signed)
   Date of Admission:  10/10/2021       Subjective: Pts pain fluctuates Better than before No fever  Medications:   enoxaparin (LOVENOX) injection  40 mg Subcutaneous Q24H   lidocaine  2 patch Transdermal Q24H   orphenadrine  60 mg Intravenous Q12H   polyethylene glycol  17 g Oral Daily    Objective: Vital signs in last 24 hours: Patient Vitals for the past 24 hrs:  BP Temp Temp src Pulse Resp SpO2 Weight  10/21/21 0756 118/76 98.3 F (36.8 C) -- 63 18 98 % --  10/21/21 0533 116/74 98.9 F (37.2 C) Oral 64 17 100 % --  10/20/21 1937 105/78 -- -- 72 -- 99 % --  10/20/21 1533 120/68 98.5 F (36.9 C) -- 79 18 100 % --  10/20/21 1202 119/71 98.1 F (36.7 C) -- 63 18 99 % 74.3 kg     PHYSICAL EXAM:  General: Alert, cooperative, no distress  Lungs: Clear to auscultation bilaterally. No Wheezing or Rhonchi. No rales. Heart: Regular rate and rhythm, no murmur, rub or gallop. Abdomen: Soft, non-tender,not distended. Bowel sounds normal. No masses Extremities: atraumatic, no cyanosis. No edema. No clubbing Skin: No rashes or lesions. Or bruising Lymph: Cervical, supraclavicular normal. Neurologic: Grossly non-focal  Lab Results Recent Labs    10/19/21 0505 10/20/21 1252  NA  --  141  K  --  4.4  CL  --  107  CO2  --  27  BUN  --  13  CREATININE 0.71 0.86   ESR on 10/14/2021 is 128 CRP is 20.4   Microbiology: 10/10/2021 MRSA and blood culture 10/13/2021 MRSA and blood culture 10/17/2021 blood culture no growth so far  Assessment/Plan: MRSA bacteremia with thoracic vertebral and cervical vertebral infection.  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 Seen by neurosurgeon No surgical intervention currently Patient is on dual MRSA coverage with daptomycin and ceftaroline. Repeat blood cultures from 6/1 no growth so far Will not be able to place a PICC line until the culture is negative on day 5.   Will  need minimum of 6 to 8 weeks of IV antibiotic.   Pt says he has been clean of IVDU for the past year since he moved out of [place]  Discussed the management with the patient and care team.

## 2021-10-21 NOTE — Progress Notes (Signed)
Progress Note   Patient: Cameron Hammond [id number] DOB: August 25, 1975 DOA: 10/10/2021     11 DOS: the patient was seen and examined on 10/21/2021   Brief hospital course: Taken from prior notes.  Mr. Alsup is a 46 year old male PMH back pain, IVDU last use 6 months prior to admission (varying history on this).  Presented to ED 10/10/21 complaining of pain in head, neck, upper back x1 week, no relief with chiropractor. [name] 05/25: In ED CT head/cervical spine and CXR negative, CTA chest no PE, paraspinal soft tissue thickening extending from T2-T5 concerning for osteomyelitis.  WBC 17.3.  Started on vancomycin, Zosyn.  Vital signs initially normal, later transient tachycardia/tachypnea, presumably due to pain, requiring IV Dilaudid for pain control.   ? MRI thoracic and cervical spine: Discitis/osteomyelitis at 2 to T3, extensive adjacent anterior and lateral paraspinal phlegmon extending to C6 and to T6, dorsal and ventral phlegmon T2-T4 ventrally and T1-T5 dorsally, probable septic arthritis of the right C2-C3 facet joint with adjacent facet osteomyelitis and right paraspinal soft tissue inflammation, right sided ventral and lateral phlegmon C2-C3.   ? MRSA on blood culture 3 of 4 bottles, Zosyn was d/c, vancomycin was continued. [name] 05/26: WBC trending down to 15.4. ID advising neurosurgery consult to eval for washout/procedure to reduce bioburden. TEE may not be needed since will not change therapy of at least 6 weeks abx IV. Repeat BCx in 48 hours to eval for clearance. Neurosurgery reviewed and communicated w/ me via secure chat, no plan at this point for surgical intervention.  [name] 05/27: pt reporting pain, see nurse notes from overnight. Otherwise remains of IV abx, ID to peripherally follow through the weekend (today is Saturday, Monday is Memorial Day). UTox (+)opiates (from inpatient administration meds), cannabinoids, tricyclics.  [name] 05/28: pain better on scheduled Norco 10 q6h, repeat cultures  pending . 05/29: (+) repeat blood cultures, ID following, changed abx to daptomycin. I have asked neurosurgery to reevaluate ([name]) . 05/30: No plans for NS or IR to go in and wash/debride. Contineu IV antibitoics per ID and repeat cultures per ID. May need long term IV Abx vs repeat imaging if persistent (+)Cx  5/31: [name].  Norco was added with Toradol.  ID is planning to repeat blood cultures tomorrow and if remain positive we will reimage.  6/1: Repeat blood cultures were obtained today.  MRI repeated yesterday which shows some progression of septic arthritis, discitis and osteomyelitis with some microabscesses. Per neurosurgery he still does not have any indication for surgical intervention and they were recommending continuing medical therapy.  6/2: Repeat blood cultures done yesterday remain negative in 24 hours.  Pain seems to be well controlled with Toradol and Norco.  No surgical intervention required at this time per neurosurgery.  Patient will remain in hospital for a long time to complete at least a 6 weeks course of IV antibiotics.  6/3: [place] 6/1 NGRD. No change in care plan.   6/5: Repeat blood cultures done on 10/17/2021 remain negative.  CRP improved to 3.  Clinically stable.    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 Vancomycin --> Daptomycin on  10/15/21    not meeting sepsis criteria as of 10/11/21   Echocardiogram showed NO vegetations on TTE, should not need TEE since won't change IV abx course.  Neurosurgical input via secure chat 05/26: MRI no discrete abscess, and no neurologic deficits to warrant emergent intervention, will continue neurologic checks q shift and prn, neurosurgery advises no intervention / no surgical procedure at this time since  unlikely to drain anything, if needed could consider IR to sample   Pain control with toradol and oxycodone   Repeat (+)blood cultures 10/13/21, ID following, have asked neurosurgery to reevaluate (secure chat to [name] 05/29)  Repeat MRI with some progression of infection but neurosurgery would like to continue with conservative management.  Repeat blood cultures were done 6/1 remain negative in 24 hours.  -Continue daptomycin  Septic arthritis of cervical spine ([place]) Management as above  History of intravenous drug abuse ([place]) States last use was six months ago  Counseled on abstinence  Hypokalemia Resolved. -Continue to monitor and replete as needed  Subjective: Patient was asking some more adjustment to his pain medications, Norco dose was increased to 10, Toradol is now as needed.  Physical Exam: Vitals:   10/20/21 1937 10/21/21 0533 10/21/21 0756 10/21/21 1115  BP: 105/78 116/74 118/76 104/73  Pulse: 72 64 63 85  Resp:  17 18 18   Temp:  98.9 F (37.2 C) 98.3 F (36.8 C) 98.5 F (36.9 C)  TempSrc:  Oral    SpO2: 99% 100% 98% 100%  Weight:      Height:       General.     In no acute distress. Pulmonary.  Lungs clear bilaterally, normal respiratory effort. CV.  Regular rate and rhythm, no JVD, rub or murmur. Abdomen.  Soft, nontender, nondistended, BS positive. CNS.  Alert and oriented .  No focal neurologic deficit. Extremities.  No edema, no cyanosis, pulses intact and symmetrical. Psychiatry.  Judgment and insight appears normal.  Data Reviewed: Prior notes and labs reviewed  Family Communication: Discussed with patient  Disposition: Status is: Inpatient Remains inpatient appropriate because: Severity of illness   Planned Discharge Destination: Home  DVT prophylaxis.  Lovenox Time spent: 40 minutes  This record has been created using Systems analyst. Errors have been sought and corrected,but may not always be located.  Such creation errors do not reflect on the standard of care.  Author: Lorella Nimrod, MD 10/21/2021 5:31 PM  For on call review [email or web address].

## 2021-10-22 ENCOUNTER — Inpatient Hospital Stay: Payer: Self-pay

## 2021-10-22 LAB — CBC WITH DIFFERENTIAL/PLATELET
Abs Immature Granulocytes: 0.09 10*3/uL — ABNORMAL HIGH (ref 0.00–0.07)
Basophils Absolute: 0 10*3/uL (ref 0.0–0.1)
Basophils Relative: 1 %
Eosinophils Absolute: 0.1 10*3/uL (ref 0.0–0.5)
Eosinophils Relative: 1 %
HCT: 33.5 % — ABNORMAL LOW (ref 39.0–52.0)
Hemoglobin: 11.1 g/dL — ABNORMAL LOW (ref 13.0–17.0)
Immature Granulocytes: 1 %
Lymphocytes Relative: 25 %
Lymphs Abs: 1.9 10*3/uL (ref 0.7–4.0)
MCH: 29.4 pg (ref 26.0–34.0)
MCHC: 33.1 g/dL (ref 30.0–36.0)
MCV: 88.6 fL (ref 80.0–100.0)
Monocytes Absolute: 0.4 10*3/uL (ref 0.1–1.0)
Monocytes Relative: 6 %
Neutro Abs: 5.2 10*3/uL (ref 1.7–7.7)
Neutrophils Relative %: 66 %
Platelets: 584 10*3/uL — ABNORMAL HIGH (ref 150–400)
RBC: 3.78 MIL/uL — ABNORMAL LOW (ref 4.22–5.81)
RDW: 12.9 % (ref 11.5–15.5)
WBC: 7.8 10*3/uL (ref 4.0–10.5)
nRBC: 0 % (ref 0.0–0.2)

## 2021-10-22 LAB — CULTURE, BLOOD (ROUTINE X 2)
Culture: NO GROWTH
Culture: NO GROWTH
Special Requests: ADEQUATE
Special Requests: ADEQUATE

## 2021-10-22 LAB — SEDIMENTATION RATE: Sed Rate: 71 mm/hr — ABNORMAL HIGH (ref 0–15)

## 2021-10-22 MED ORDER — SODIUM CHLORIDE 0.9% FLUSH
10.0000 mL | Freq: Two times a day (BID) | INTRAVENOUS | Status: DC
  Administered 2021-10-22 – 2021-11-18 (×53): 10 mL

## 2021-10-22 MED ORDER — CHLORHEXIDINE GLUCONATE CLOTH 2 % EX PADS
6.0000 | MEDICATED_PAD | Freq: Every day | CUTANEOUS | Status: DC
  Administered 2021-10-22 – 2021-11-17 (×27): 6 via TOPICAL

## 2021-10-22 MED ORDER — SODIUM CHLORIDE 0.9% FLUSH
10.0000 mL | INTRAVENOUS | Status: DC | PRN
  Administered 2021-10-25 – 2021-10-29 (×2): 10 mL

## 2021-10-22 NOTE — Assessment & Plan Note (Signed)
Suspect hematogenous seeding from prior IVDU Cx (+)MRSA MRI 05/25 see results   Continue abx. Vancomycin --> Daptomycin on  10/15/21    not meeting sepsis criteria as of 10/11/21   Echocardiogram showed NO vegetations on TTE, should not need TEE since won't change IV abx course.   Neurosurgical input via secure chat 05/26: MRI no discrete abscess, and no neurologic deficits to warrant emergent intervention, will continue neurologic checks q shift and prn, neurosurgery advises no intervention / no surgical procedure at this time since unlikely to drain anything, if needed could consider IR to sample   Pain control with toradol and oxycodone   Repeat (+)blood cultures 10/13/21, ID following, have asked neurosurgery to reevaluate (secure chat to [name] 05/29)  Repeat MRI with some progression of infection but neurosurgery would like to continue with conservative management.  Repeat blood cultures were done 6/1 remain negative in 5 days  PICC line ordered  -Continue daptomycin-Will need for 6 weeks, end date 11/28/2021

## 2021-10-22 NOTE — Progress Notes (Signed)
Peripherally Inserted Central Catheter Placement  The IV Nurse has discussed with the patient and/or persons authorized to consent for the patient, the purpose of this procedure and the potential benefits and risks involved with this procedure.  The benefits include less needle sticks, lab draws from the catheter, and the patient may be discharged home with the catheter. Risks include, but not limited to, infection, bleeding, blood clot (thrombus formation), and puncture of an artery; nerve damage and irregular heartbeat and possibility to perform a PICC exchange if needed/ordered by physician.  Alternatives to this procedure were also discussed.  Bard Power PICC patient education guide, fact sheet on infection prevention and patient information card has been provided to patient /or left at bedside.    PICC Placement Documentation  PICC Single Lumen [id number] Left Basilic 45 cm 0 cm (Active)  Indication for Insertion or Continuance of Line Prolonged intravenous therapies 10/22/21 2030  Exposed Catheter (cm) 0 cm 10/22/21 2030  Site Assessment Clean, Dry, Intact 10/22/21 2030  Line Status Blood return noted;Flushed;Saline locked 10/22/21 2030  Dressing Type Transparent;Securing device 10/22/21 2030  Dressing Status Clean, Dry, Intact;Antimicrobial disc in place 10/22/21 2030  Safety Lock Not Applicable [id number]  Line Care Connections checked and tightened 10/22/21 2030  Line Adjustment (NICU/IV Team Only) No 10/22/21 2030  Dressing Intervention New dressing 10/22/21 2030  Dressing Change Due 10/29/21 10/22/21 2030       Edson Snowball 10/22/2021, 8:50 PM

## 2021-10-22 NOTE — Treatment Plan (Addendum)
Diagnosis: MRSA bacteremia and MRSA vertebral osteo and discitis Baseline Creatinine <A    No Known Allergies  OPAT Orders Discharge antibiotics: Daptomycin 842m IV every 24 hours  Duration: For a total of 6 weeks End Date: 11/28/21  [facility]  Labs weekly while on IV antibiotics: _X_ CBC with differential __ BMP _X_ CMP _X_ CRP _X_ ESR _X_ CK X urine tox screen  _X_ Please pull PIC at completion of IV antibiotics   Fax weekly labs to [phone] Clinic Follow Up Appt:with Dr.Julieta Rogalski on 11/14/21 at 9.30am    Call [phone] any questions or concerns

## 2021-10-22 NOTE — Assessment & Plan Note (Signed)
States last use was more than a year ago. Had a long discussion again to avoid missed use of PICC line, per patient he is done with drug abuse and staying sober since he moved out of [place].  Counseled on abstinence

## 2021-10-22 NOTE — TOC Progression Note (Signed)
Transition of Care [facility]) - Progression Note    Patient Details  Name: Cameron Hammond MRN: [id number] Date of Birth: 02/03/1976  [facility]) CM/SW Contact  Marlowe Sax, RN Phone Number: 10/22/2021, 12:24 PM  Clinical Narrative:     Notified Pam with [employer] that the patient will need IV ABX, will need Nursing with helms due to no ins Possible PICC line placement and DC tomorrow  Expected Discharge Plan: Home/Self Care Barriers to Discharge: Continued Medical Work up  Expected Discharge Plan and Services Expected Discharge Plan: Home/Self Care                                               Social Determinants of Health (SDOH) Interventions    Readmission Risk Interventions     View : No data to display.

## 2021-10-22 NOTE — Progress Notes (Signed)
Pharmacy Antibiotic Note  Cameron Hammond is a 46 y.o. male admitted on 10/10/2021 with sepsis d/t MRSA bacteremia.  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    No Known Allergies  Antimicrobials this admission: Zosyn 5/25>>5/25 vancomycin 5/25 >> 5/29 5/30 Daptomycin >>  Ceftaroline 5/31>>  Microbiology results: 5/25 Bcx 4of4 bottles>> MRSA  5/28 3of4: still MRSA +  MRI : Discitis-osteomyelitis at T2-T3,  with extensive adjacent anterior and lateral paraspinal phlegmon  Thank you for allowing pharmacy to be a part of this patient's care.  Deyvi Bonanno Rodriguez-Guzman PharmD, BCPS 10/22/2021 10:31 AM

## 2021-10-22 NOTE — Progress Notes (Signed)
   Date of Admission:  10/10/2021    Date of first neg culture 10/17/21   Subjective: Pt is upset because of issues related to not having insurance Pain under control with toradol and hydrocodone  Medications:   enoxaparin (LOVENOX) injection  40 mg Subcutaneous Q24H   lidocaine  2 patch Transdermal Q24H   orphenadrine  60 mg Intravenous Q12H   polyethylene glycol  17 g Oral Daily    Objective: Vital signs in last 24 hours: Patient Vitals for the past 24 hrs:  BP Temp Temp src Pulse Resp SpO2  10/22/21 1312 (!) 100/58 98.4 F (36.9 C) -- 67 16 98 %  10/22/21 0911 111/73 97.8 F (36.6 C) -- (!) 58 15 100 %  10/22/21 0417 108/78 97.7 F (36.5 C) Oral (!) 59 17 100 %  10/21/21 2133 108/63 97.9 F (36.6 C) -- 74 20 99 %  10/21/21 1946 106/68 98.3 F (36.8 C) -- 61 17 99 %     PHYSICAL EXAM:  General: Alert, cooperative, no distress  Lungs: Clear to auscultation bilaterally. No Wheezing or Rhonchi. No rales. Heart: Regular rate and rhythm, no murmur, rub or gallop. Abdomen: Soft, non-tender,not distended. Bowel sounds normal. No masses Extremities: atraumatic, no cyanosis. No edema. No clubbing Skin: No rashes or lesions. Or bruising Lymph: Cervical, supraclavicular normal. Neurologic: Grossly non-focal  Lab Results Recent Labs    10/20/21 1252 10/22/21 0603  WBC  --  7.8  HGB  --  11.1*  HCT  --  33.5*  NA 141  --   K 4.4  --   CL 107  --   CO2 27  --   BUN 13  --   CREATININE 0.86  --    ESR on 10/14/2021 is 128 CRP is 20.4   Microbiology: 10/10/2021 MRSA and blood culture 10/13/2021 MRSA and blood culture 10/17/2021 blood culture no growth  Assessment/Plan: MRSA bacteremia with thoracic vertebral and cervical vertebral infection.  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 Seen by neurosurgeon No surgical intervention currently Patient is on dual MRSA coverage with daptomycin and  ceftaroline. Repeat blood cultures from 6/1 no growth so far On Dual MRSA coverage Day 7  .Will DC ceftaroline on discharge and just continue dapto PICC line can be placed in preparation for discharge Will need minimum of 6  of IV antibiotic and oral antibiotic after that.   Pt says he has been clean of IVDU for the past year  or more since he moved out of [place]. 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

## 2021-10-22 NOTE — Progress Notes (Signed)
Progress Note   Patient: Cameron Hammond IWP:[id number] DOB: Sep 16, 1975 DOA: 10/10/2021     12 DOS: the patient was seen and examined on 10/22/2021   Brief hospital course: Taken from prior notes.  [name] is a 46 year old male PMH back pain, IVDU last use 6 months prior to admission (varying history on this).  Presented to ED 10/10/21 complaining of pain in head, neck, upper back x1 week, no relief with chiropractor. [name] 05/25: In ED CT head/cervical spine and CXR negative, CTA chest no PE, paraspinal soft tissue thickening extending from T2-T5 concerning for osteomyelitis.  WBC 17.3.  Started on vancomycin, Zosyn.  Vital signs initially normal, later transient tachycardia/tachypnea, presumably due to pain, requiring IV Dilaudid for pain control.   ? MRI thoracic and cervical spine: Discitis/osteomyelitis at 2 to T3, extensive adjacent anterior and lateral paraspinal phlegmon extending to C6 and to T6, dorsal and ventral phlegmon T2-T4 ventrally and T1-T5 dorsally, probable septic arthritis of the right C2-C3 facet joint with adjacent facet osteomyelitis and right paraspinal soft tissue inflammation, right sided ventral and lateral phlegmon C2-C3.   ? MRSA on blood culture 3 of 4 bottles, Zosyn was d/c, vancomycin was continued. [name] 05/26: WBC trending down to 15.4. ID advising neurosurgery consult to eval for washout/procedure to reduce bioburden. TEE may not be needed since will not change therapy of at least 6 weeks abx IV. Repeat BCx in 48 hours to eval for clearance. Neurosurgery reviewed and communicated w/ me via secure chat, no plan at this point for surgical intervention.  [name] 05/27: pt reporting pain, see nurse notes from overnight. Otherwise remains of IV abx, ID to peripherally follow through the weekend (today is Saturday, Monday is Memorial Day). UTox (+)opiates (from inpatient administration meds), cannabinoids, tricyclics.  [name] 05/28: pain better on scheduled Norco 10 q6h, repeat cultures  pending . 05/29: (+) repeat blood cultures, ID following, changed abx to daptomycin. I have asked neurosurgery to reevaluate ([name]) . 05/30: No plans for NS or IR to go in and wash/debride. Contineu IV antibitoics per ID and repeat cultures per ID. May need long term IV Abx vs repeat imaging if persistent (+)Cx  5/31: [name].  Norco was added with Toradol.  ID is planning to repeat blood cultures tomorrow and if remain positive we will reimage.  6/1: Repeat blood cultures were obtained today.  MRI repeated yesterday which shows some progression of septic arthritis, discitis and osteomyelitis with some microabscesses. Per neurosurgery he still does not have any indication for surgical intervention and they were recommending continuing medical therapy.  6/2: Repeat blood cultures done yesterday remain negative in 24 hours.  Pain seems to be well controlled with Toradol and Norco.  No surgical intervention required at this time per neurosurgery.  Patient will remain in hospital for a long time to complete at least a 6 weeks course of IV antibiotics.  6/3: [place] 6/1 NGRD. No change in care plan.   6/5: Repeat blood cultures done on 10/17/2021 remain negative.  CRP improved to 3.  Clinically stable.   6/6: Blood cultures from 10/17/2021 remain negative for 5 days.  ESR improving and leukocytosis has been resolved.  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  PICC line ordered and ID is recommending outpatient IV daptomycin for 6 weeks with end date  of 11/28/2021. TOC is trying to find a TEFL teacher for home health. Patient also need assistance with medications.  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 Vancomycin --> Daptomycin on  10/15/21    not meeting sepsis criteria as of 10/11/21   Echocardiogram showed NO vegetations on TTE, should not need TEE since won't change IV abx course.   Neurosurgical input via secure chat 05/26: MRI no discrete abscess, and no neurologic deficits to warrant emergent intervention, will continue neurologic checks q shift and prn, neurosurgery advises no intervention / no surgical procedure at this time since unlikely to drain anything, if needed could consider IR to sample   Pain control with toradol and oxycodone   Repeat (+)blood cultures 10/13/21, ID following, have asked neurosurgery to reevaluate (secure chat to [name] 05/29)  Repeat MRI with some progression of infection but neurosurgery would like to continue with conservative management.  Repeat blood cultures were done 6/1 remain negative in 5 days  PICC line ordered  -Continue daptomycin-Will need for 6 weeks, end date 11/28/2021  Septic arthritis of cervical spine ([facility]) Management as above  History of intravenous drug abuse ([place]) States last use was more than a year ago. Had a long discussion again to avoid missed use of PICC line, per patient he is done with drug abuse and staying sober since he moved out of [place].  Counseled on abstinence  Hypokalemia Resolved. -Continue to monitor and replete as needed   Subjective: Patient was seen and examined today.  Continued to have upper back pain. Had a long discussion regarding his substance abuse.  Physical Exam: Vitals:   10/22/21 0417 10/22/21 0911 10/22/21 1312 10/22/21 1634  BP: 108/78 111/73 (!) 100/58 113/67  Pulse: (!) 59 (!) 58 67 65  Resp: _0 Temp: 97.7 F (36.5 C) 97.8 F (36.6 C) 98.4 F (36.9 C) 98.1 F (36.7 C)  TempSrc: Oral     SpO2: 100% 100% 98% 97%  Weight:      Height:       General.  Well-developed gentleman, in no acute  distress. Pulmonary.  Lungs clear bilaterally, normal respiratory effort. CV.  Regular rate and rhythm, no JVD, rub or murmur. Abdomen.  Soft, nontender, nondistended, BS positive. CNS.  Alert and oriented .  No focal neurologic deficit. Extremities.  No edema, no cyanosis, pulses intact and symmetrical. Psychiatry.  Judgment and insight appears normal.  Data Reviewed: Prior notes and labs reviewed  Family Communication: Discussed with patient  Disposition: Status is: Inpatient Remains inpatient appropriate because: Severity of illness   Planned Discharge Destination: Home with Home Health  DVT prophylaxis.  Lovenox Time spent: 45 minutes  This record has been created using Systems analyst. Errors have been sought and corrected,but may not always be located. Such creation errors do not reflect on the standard of care.  Author: Lorella Nimrod, MD 10/22/2021 4:56 PM  For on call review [email or web address].

## 2021-10-22 NOTE — Plan of Care (Signed)
  Problem: Clinical Measurements: Goal: Ability to maintain clinical measurements within normal limits will improve Outcome: Progressing Goal: Will remain free from infection Outcome: Progressing Goal: Diagnostic test results will improve Outcome: Progressing Goal: Respiratory complications will improve Outcome: Progressing Goal: Cardiovascular complication will be avoided Outcome: Progressing   Problem: Elimination: Goal: Will not experience complications related to bowel motility Outcome: Progressing Goal: Will not experience complications related to urinary retention Outcome: Progressing   Problem: Coping: Goal: Level of anxiety will decrease Outcome: Progressing

## 2021-10-22 NOTE — Assessment & Plan Note (Signed)
Resolved. -Continue to monitor and replete as needed 

## 2021-10-22 NOTE — TOC Progression Note (Signed)
Transition of Care [facility]) - Progression Note    Patient Details  Name: Lecil Tapp MRN: [id number] Date of Birth: 1976-01-29  Transition of Care [facility]) CM/SW Contact  Marlowe Sax, RN Phone Number: 10/22/2021, 2:43 PM  Clinical Narrative:     Iantha Fallen the Home health agency that is =on for charity this week will not accept the patient for Lab draws and PICC line care, He may need to go to Outpatient same day surgery for this, I made the physician aware  Expected Discharge Plan: Home/Self Care Barriers to Discharge: Continued Medical Work up  Expected Discharge Plan and Services Expected Discharge Plan: Home/Self Care                                               Social Determinants of Health (SDOH) Interventions    Readmission Risk Interventions     View : No data to display.

## 2021-10-23 NOTE — Progress Notes (Signed)
PROGRESS NOTE    Cameron Hammond  YOV:[id number] DOB: 1976/02/27 DOA: 10/10/2021 PCP: Patient, No Pcp Per (Inactive)    Brief Narrative:  Taken from prior notes.  Cameron Hammond is a 46 year old male PMH back pain, IVDU last use 6 months prior to admission (varying history on this).  Presented to ED 10/10/21 complaining of pain in head, neck, upper back x1 week, no relief with chiropractor. 05/25: In ED CT head/cervical spine and CXR negative, CTA chest no PE, paraspinal soft tissue thickening extending from T2-T5 concerning for osteomyelitis.  WBC 17.3.  Started on vancomycin, Zosyn.  Vital signs initially normal, later transient tachycardia/tachypnea, presumably due to pain, requiring IV Dilaudid for pain control.   MRI thoracic and cervical spine: Discitis/osteomyelitis at 2 to T3, extensive adjacent anterior and lateral paraspinal phlegmon extending to C6 and to T6, dorsal and ventral phlegmon T2-T4 ventrally and T1-T5 dorsally, probable septic arthritis of the right C2-C3 facet joint with adjacent facet osteomyelitis and right paraspinal soft tissue inflammation, right sided ventral and lateral phlegmon C2-C3.   MRSA on blood culture 3 of 4 bottles, Zosyn was d/c, vancomycin was continued. 05/26: WBC trending down to 15.4. ID advising neurosurgery consult to eval for washout/procedure to reduce bioburden. TEE may not be needed since will not change therapy of at least 6 weeks abx IV. Repeat BCx in 48 hours to eval for clearance. Neurosurgery reviewed and communicated w/ me via secure chat, no plan at this point for surgical intervention.  05/27: pt reporting pain, see nurse notes from overnight. Otherwise remains of IV abx, ID to peripherally follow through the weekend (today is Saturday, Monday is Memorial Day). UTox (+)opiates (from inpatient administration meds), cannabinoids, tricyclics.  05/28: pain better on scheduled Norco 10 q6h, repeat cultures pending 05/29: (+) repeat blood cultures, ID  following, changed abx to daptomycin. I have asked neurosurgery to reevaluate (Cameron Hammond) 05/30: No plans for NS or IR to go in and wash/debride. Contineu IV antibitoics per ID and repeat cultures per ID. May need long term IV Abx vs repeat imaging if persistent (+)Cx   5/31: [name].  Norco was added with Toradol.  ID is planning to repeat blood cultures tomorrow and if remain positive we will reimage.   6/1: Repeat blood cultures were obtained today.  MRI repeated yesterday which shows some progression of septic arthritis, discitis and osteomyelitis with some microabscesses. Per neurosurgery he still does not have any indication for surgical intervention and they were recommending continuing medical therapy.   6/2: Repeat blood cultures done yesterday remain negative in 24 hours.  Pain seems to be well controlled with Toradol and Norco.  No surgical intervention required at this time per neurosurgery.  Patient will remain in hospital for a long time to complete at least a 6 weeks course of IV antibiotics.   6/3: [place] 6/1 NGRD. No change in care plan.    6/5: Repeat blood cultures done on 10/17/2021 remain negative.  CRP improved to 3.  Clinically stable.    6/6: Blood cultures from 10/17/2021 remain negative for 5 days.  ESR improving and leukocytosis has been resolved.  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  PICC line ordered and ID is recommending outpatient IV daptomycin for 6 weeks with end date of 11/28/2021. TOC is trying to find a  TEFL teacher for home health. Patient also need assistance with medications.  Pharmacy is aware  6/7 c/o of pain. No sob or cp. Ambulating in hallway. Picc placed today  Consultants:  ID.  Neurosurgery  Procedures:   Antimicrobials:  ceftaroline   Subjective: As  above  Objective: Vitals:   10/22/21 1947 10/23/21 0515 10/23/21 0835 10/23/21 1251  BP: 112/65 112/67 99/68 115/72  Pulse: 66 62 68 71  Resp: _0 Temp: 98 F (36.7 C) 97.9 F (36.6 C) 98 F (36.7 C) 98.3 F (36.8 C)  TempSrc:      SpO2: 100% 100% 100% 100%  Weight:      Height:        Intake/Output Summary (Last 24 hours) at 10/23/2021 1521 Last data filed at 10/23/2021 0302 Gross per 24 hour  Intake 340 ml  Output 850 ml  Net -510 ml   Filed Weights   10/10/21 1152 10/20/21 1202  Weight: 79.4 kg 74.3 kg    Examination: Calm, NAD Cta no w/r Reg s1/s2 no gallop Soft benign +bs No edema Aaoxox3  Mood and affect appropriate in current setting     Data Reviewed: I have personally reviewed following labs and imaging studies  CBC: Recent Labs  Lab 10/22/21 0603  WBC 7.8  NEUTROABS 5.2  HGB 11.1*  HCT 33.5*  MCV 88.6  PLT 820*   Basic Metabolic Panel: Recent Labs  Lab 10/17/21 0407 10/19/21 0505 10/20/21 1252  NA  --   --  141  K  --   --  4.4  CL  --   --  107  CO2  --   --  27  GLUCOSE  --   --  90  BUN  --   --  13  CREATININE 0.68 0.71 0.86  CALCIUM  --   --  9.0   GFR: Estimated Creatinine Clearance: 101.4 mL/min (by C-G formula based on SCr of 0.86 mg/dL). Liver Function Tests: No results for input(s): AST, ALT, ALKPHOS, BILITOT, PROT, ALBUMIN in the last 168 hours. No results for input(s): LIPASE, AMYLASE in the last 168 hours. No results for input(s): AMMONIA in the last 168 hours. Coagulation Profile: No results for input(s): INR, PROTIME in the last 168 hours. Cardiac Enzymes: Recent Labs  Lab 10/21/21 0631  CKTOTAL 39*   BNP (last 3 results) No results for input(s): PROBNP in the last 8760 hours. HbA1C: No results for input(s): HGBA1C in the last 72 hours. CBG: Recent Labs  Lab 10/18/21 0759  GLUCAP 84   Lipid Profile: No results for input(s): CHOL, HDL, LDLCALC, TRIG, CHOLHDL, LDLDIRECT in the last 72  hours. Thyroid Function Tests: No results for input(s): TSH, T4TOTAL, FREET4, T3FREE, THYROIDAB in the last 72 hours. Anemia Panel: No results for input(s): VITAMINB12, FOLATE, FERRITIN, TIBC, IRON, RETICCTPCT in the last 72 hours. Sepsis Labs: No results for input(s): PROCALCITON, LATICACIDVEN in the last 168 hours.  Recent Results (from the past 240 hour(s))  Culture, blood (Routine X 2) w Reflex to ID Panel     Status: None   Collection Time: 10/17/21  4:06 AM   Specimen: BLOOD  Result Value Ref Range Status   Specimen Description BLOOD LEFT [facility]  Final   Special Requests   Final    BOTTLES DRAWN AEROBIC AND ANAEROBIC Blood Culture adequate volume   Culture   Final    NO GROWTH 5 DAYS Performed at [facility], [place],  [place] [address]    Report Status 10/22/2021 FINAL  Final  Culture, blood (Routine X 2) w Reflex to ID Panel     Status: None   Collection Time: 10/17/21  4:06 AM   Specimen: BLOOD  Result Value Ref Range Status   Specimen Description BLOOD LEFT HAND  Final   Special Requests   Final    BOTTLES DRAWN AEROBIC AND ANAEROBIC Blood Culture adequate volume   Culture   Final    NO GROWTH 5 DAYS Performed at [facility], [address], [place], [place] [address]    Report Status 10/22/2021 FINAL  Final         Radiology Studies: [place] EKG SITE RITE  Result Date: 10/22/2021 If Site Rite image not attached, placement could not be confirmed due to current cardiac rhythm.       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 Vancomycin --> Daptomycin on  10/15/21   not meeting sepsis criteria as of 10/11/21  Echocardiogram showed NO vegetations on TTE, should not need TEE since won't change IV abx course.  Neurosurgical input via secure chat 05/26: MRI no discrete abscess, and no neurologic deficits to warrant emergent intervention, will continue neurologic checks q shift and prn, neurosurgery advises no intervention / no surgical procedure at this time since unlikely to drain anything, if needed could consider IR to sample  Pain control with toradol and oxycodone  Repeat (+)blood cultures 10/13/21, ID following, have asked neurosurgery to reevaluate (secure chat to [name] 05/29) Repeat MRI with some progression of infection but neurosurgery would like to continue with conservative management. Repeat blood cultures were done 6/1 remain negative in 5 days 6/7 PICC placed.  Continue daptomycin 800 mg IV daily with end date on 11/28/2021    Septic arthritis of cervical spine ([place]) Continue daptomycin as above   History of intravenous drug abuse ([place]) States last use was more than a year ago. Had a long discussion again to avoid missed use of PICC line, per patient he is done with drug abuse and staying sober since he moved out of [place]. 6/7 pt was counseled on abstinence during hospitalization   Hypokalemia Resolved    DVT prophylaxis: ambulating Code Status:full Family Communication: none Disposition Plan:  Status is: Inpatient Remains inpatient appropriate because: iv tx. Pt with h/xo IVDU , cannot dc  on picc.        LOS: 13 days   Time spent: 35 min    Nolberto Hanlon, MD [facility] Pager [phone]  If 7PM-7AM, please contact night-coverage 10/23/2021, 3:21 PM

## 2021-10-23 NOTE — Progress Notes (Signed)
   Date of Admission:  10/10/2021    Date of first neg culture 10/17/21   Subjective: Pain not well controlled Still has neck and back pain No fever  Medications:   Chlorhexidine Gluconate Cloth  6 each Topical Daily   enoxaparin (LOVENOX) injection  40 mg Subcutaneous Q24H   lidocaine  2 patch Transdermal Q24H   orphenadrine  60 mg Intravenous Q12H   polyethylene glycol  17 g Oral Daily   sodium chloride flush  10-40 mL Intracatheter Q12H    Objective: Vital signs in last 24 hours: Patient Vitals for the past 24 hrs:  BP Temp Pulse Resp SpO2  10/23/21 1251 115/72 98.3 F (36.8 C) 71 16 100 %  10/23/21 0835 99/68 98 F (36.7 C) 68 16 100 %  10/23/21 0515 112/67 97.9 F (36.6 C) 62 20 100 %  10/22/21 1947 112/65 98 F (36.7 C) 66 20 100 %     PHYSICAL EXAM:  General: Alert, cooperative, no distress  Lungs: Clear to auscultation bilaterally. No Wheezing or Rhonchi. No rales. Heart: Regular rate and rhythm, no murmur, rub or gallop. Abdomen: Soft, non-tender,not distended. Bowel sounds normal. No masses Extremities: atraumatic, no cyanosis. No edema. No clubbing Skin: No rashes or lesions. Or bruising Lymph: Cervical, supraclavicular normal. 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 Seen by neurosurgeon No surgical intervention currently Patient is on dual MRSA coverage with daptomycin and ceftaroline. Repeat blood cultures from 6/1 no growth so far On Dual MRSA coverage Day 8  .Will DC ceftaroline on discharge and just continue dapto PICC was placed as there was a plan for  possible discharge It may be prudent to control his pain well , as there is a risk for reverting to IVDA if pain is not well controlled  Will need minimum of 6  of IV antibiotic 11/28/21 and oral antibiotic after that.   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 He has a job at [employer]. But said he would be safe in the hospital Spoke to his mother at bedside.    Discussed the management with the patient and care team.

## 2021-10-24 LAB — BASIC METABOLIC PANEL
Anion gap: 8 (ref 5–15)
BUN: 21 mg/dL — ABNORMAL HIGH (ref 6–20)
CO2: 26 mmol/L (ref 22–32)
Calcium: 9.4 mg/dL (ref 8.9–10.3)
Chloride: 103 mmol/L (ref 98–111)
Creatinine, Ser: 1.13 mg/dL (ref 0.61–1.24)
GFR, Estimated: >60 mL/min (ref >60)
Glucose, Bld: 110 mg/dL — ABNORMAL HIGH (ref 70–99)
Potassium: 4.5 mmol/L (ref 3.5–5.1)
Sodium: 137 mmol/L (ref 135–145)

## 2021-10-24 MED ORDER — IBUPROFEN 400 MG PO TABS
800.0000 mg | ORAL_TABLET | Freq: Three times a day (TID) | ORAL | Status: DC | PRN
  Administered 2021-10-24 – 2021-10-29 (×8): 800 mg via ORAL
  Filled 2021-10-24 (×8): qty 2

## 2021-10-24 MED ORDER — ACETAMINOPHEN 650 MG RE SUPP
650.0000 mg | Freq: Four times a day (QID) | RECTAL | Status: DC | PRN
Start: 2021-10-24 — End: 2021-11-18

## 2021-10-24 MED ORDER — DICLOFENAC SODIUM 1 % EX GEL
2.0000 g | Freq: Three times a day (TID) | CUTANEOUS | Status: DC | PRN
  Administered 2021-10-24: 2 g via TOPICAL
  Filled 2021-10-24: qty 100

## 2021-10-24 MED ORDER — ACETAMINOPHEN 325 MG PO TABS
650.0000 mg | ORAL_TABLET | ORAL | Status: DC | PRN
  Administered 2021-10-24 – 2021-11-10 (×5): 650 mg via ORAL
  Filled 2021-10-24 (×6): qty 2

## 2021-10-24 NOTE — TOC Progression Note (Signed)
Transition of Care [facility]) - Progression Note    Patient Details  Name: Cameron Hammond MRN: [id number] Date of Birth: [birth year]  Transition of Care [facility]) CM/SW [place], RN Phone Number: 10/24/2021, 9:27 AM  Clinical Narrative:    Spoke with the patient at the bedside I explained that finding [facility] is difficult due to agencies having nursing and him not having insurance I explained that the charity HH changes weekly and that I will try again next week, he stated understanding   Expected Discharge Plan: Home/Self Care Barriers to Discharge: Continued Medical Work up  Expected Discharge Plan and Services Expected Discharge Plan: Home/Self Care                                               Social Determinants of Health (SDOH) Interventions    Readmission Risk Interventions     No data to display

## 2021-10-24 NOTE — Progress Notes (Signed)
PROGRESS NOTE    Cameron Hammond  YBW:[id number] DOB: Jul 13, 1975 DOA: 10/10/2021 PCP: Patient, No Pcp Per (Inactive)    Brief Narrative:  Taken from prior notes.  Mr. Cameron Hammond is a 46 year old male PMH back pain, IVDU last use 6 months prior to admission (varying history on this).  Presented to ED 10/10/21 complaining of pain in head, neck, upper back x1 week, no relief with chiropractor. 05/25: In ED CT head/cervical spine and CXR negative, CTA chest no PE, paraspinal soft tissue thickening extending from T2-T5 concerning for osteomyelitis.  WBC 17.3.  Started on vancomycin, Zosyn.  Vital signs initially normal, later transient tachycardia/tachypnea, presumably due to pain, requiring IV Dilaudid for pain control.   MRI thoracic and cervical spine: Discitis/osteomyelitis at 2 to T3, extensive adjacent anterior and lateral paraspinal phlegmon extending to C6 and to T6, dorsal and ventral phlegmon T2-T4 ventrally and T1-T5 dorsally, probable septic arthritis of the right C2-C3 facet joint with adjacent facet osteomyelitis and right paraspinal soft tissue inflammation, right sided ventral and lateral phlegmon C2-C3.   MRSA on blood culture 3 of 4 bottles, Zosyn was d/c, vancomycin was continued. 05/26: WBC trending down to 15.4. ID advising neurosurgery consult to eval for washout/procedure to reduce bioburden. TEE may not be needed since will not change therapy of at least 6 weeks abx IV. Repeat BCx in 48 hours to eval for clearance. Neurosurgery reviewed and communicated w/ me via secure chat, no plan at this point for surgical intervention.  05/27: pt reporting pain, see nurse notes from overnight. Otherwise remains of IV abx, ID to peripherally follow through the weekend (today is Saturday, Monday is Memorial Day). UTox (+)opiates (from inpatient administration meds), cannabinoids, tricyclics.  05/28: pain better on scheduled Norco 10 q6h, repeat cultures pending 05/29: (+) repeat blood cultures, ID  following, changed abx to daptomycin. I have asked neurosurgery to reevaluate (Dr. Tamala Hammond) 05/30: No plans for NS or IR to go in and wash/debride. Contineu IV antibitoics per ID and repeat cultures per ID. May need long term IV Abx vs repeat imaging if persistent (+)Cx   5/31: [name].  Norco was added with Toradol.  ID is planning to repeat blood cultures tomorrow and if remain positive we will reimage.   6/1: Repeat blood cultures were obtained today.  MRI repeated yesterday which shows some progression of septic arthritis, discitis and osteomyelitis with some microabscesses. Per neurosurgery he still does not have any indication for surgical intervention and they were recommending continuing medical therapy.   6/2: Repeat blood cultures done yesterday remain negative in 24 hours.  Pain seems to be well controlled with Toradol and Norco.  No surgical intervention required at this time per neurosurgery.  Patient will remain in hospital for a long time to complete at least a 6 weeks course of IV antibiotics.   6/3: [place] 6/1 NGRD. No change in care plan.    6/5: Repeat blood cultures done on 10/17/2021 remain negative.  CRP improved to 3.  Clinically stable.    6/6: Blood cultures from 10/17/2021 remain negative for 5 days.  ESR improving and leukocytosis has been resolved.  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  PICC line ordered and ID is recommending outpatient IV daptomycin for 6 weeks with end date of 11/28/2021. TOC is trying to find a  TEFL teacher for home health. Patient also need assistance with medications.  Pharmacy is aware  6/7 c/o of pain. No sob or cp. Ambulating in hallway. Picc placed today 6/8 complaining of pain between shoulder blades. Would like his pain meds closer to eachother. I explainted to pt that plan  is to dc iv toradol as he has been on it for a period and also cannot be going home eventually with iv pain meds. Explained he will need to utilize oxycodone and tylenol more for now with taper to only tylenol.  Consultants:  ID.  Neurosurgery  Procedures:   Antimicrobials:  ceftaroline   Subjective: No cp or sob  Objective: Vitals:   10/23/21 1723 10/23/21 1937 10/24/21 0458 10/24/21 0858  BP: 104/66 121/71 108/76 111/71  Pulse: 67 68 (!) 56 68  Resp: _0 Temp: 98.3 F (36.8 C) 98 F (36.7 C) (!) 97.5 F (36.4 C) 97.9 F (36.6 C)  TempSrc:      SpO2: 100% 100% 100% 100%  Weight:      Height:        Intake/Output Summary (Last 24 hours) at 10/24/2021 1402 Last data filed at 10/24/2021 0857 Gross per 24 hour  Intake --  Output 1700 ml  Net -1700 ml   Filed Weights   10/10/21 1152 10/20/21 1202  Weight: 79.4 kg 74.3 kg    Examination: Calm, NAD Cta no w/r Reg s1/s2 no gallop Soft benign +bs No edema Aaoxox3  Mood and affect appropriate in current setting     Data Reviewed: I have personally reviewed following labs and imaging studies  CBC: Recent Labs  Lab 10/22/21 0603  WBC 7.8  NEUTROABS 5.2  HGB 11.1*  HCT 33.5*  MCV 88.6  PLT 161*   Basic Metabolic Panel: Recent Labs  Lab 10/19/21 0505 10/20/21 1252  NA  --  141  K  --  4.4  CL  --  107  CO2  --  27  GLUCOSE  --  90  BUN  --  13  CREATININE 0.71 0.86  CALCIUM  --  9.0   GFR: Estimated Creatinine Clearance: 101.4 mL/min (by C-G formula based on SCr of 0.86 mg/dL). Liver Function Tests: No results for input(s): "AST", "ALT", "ALKPHOS", "BILITOT", "PROT", "ALBUMIN" in the last 168 hours. No results for input(s): "LIPASE", "AMYLASE" in the last 168 hours. No results for input(s): "AMMONIA" in the last 168 hours. Coagulation Profile: No results for input(s): "INR", "PROTIME" in the last 168 hours. Cardiac Enzymes: Recent Labs  Lab 10/21/21 0631  CKTOTAL 39*   BNP (last 3  results) No results for input(s): "PROBNP" in the last 8760 hours. HbA1C: No results for input(s): "HGBA1C" in the last 72 hours. CBG: Recent Labs  Lab 10/18/21 0759  GLUCAP 84   Lipid Profile: No results for input(s): "CHOL", "HDL", "LDLCALC", "TRIG", "CHOLHDL", "LDLDIRECT" in the last 72 hours. Thyroid Function Tests: No results for input(s): "TSH", "T4TOTAL", "FREET4", "T3FREE", "THYROIDAB" in the last 72 hours. Anemia Panel: No results for input(s): "VITAMINB12", "FOLATE", "FERRITIN", "TIBC", "IRON", "RETICCTPCT" in the last 72 hours. Sepsis Labs: No results for input(s): "PROCALCITON", "LATICACIDVEN" in the last 168 hours.  Recent Results (from the past 240 hour(s))  Culture, blood (Routine X 2) w Reflex to ID Panel     Status: None   Collection Time: 10/17/21  4:06 AM   Specimen: BLOOD  Result Value Ref Range Status   Specimen Description BLOOD LEFT AC  Final  Special Requests   Final    BOTTLES DRAWN AEROBIC AND ANAEROBIC Blood Culture adequate volume   Culture   Final    NO GROWTH 5 DAYS Performed at [facility], [address] [place], [place] [address]    Report Status 10/22/2021 FINAL  Final  Culture, blood (Routine X 2) w Reflex to ID Panel     Status: None   Collection Time: 10/17/21  4:06 AM   Specimen: BLOOD  Result Value Ref Range Status   Specimen Description BLOOD LEFT HAND  Final   Special Requests   Final    BOTTLES DRAWN AEROBIC AND ANAEROBIC Blood Culture adequate volume   Culture   Final    NO GROWTH 5 DAYS Performed at [facility], [address], [place], [place] [address]    Report Status 10/22/2021 FINAL  Final         Radiology Studies: [place] EKG SITE RITE  Result Date: 10/22/2021 If Site Rite image not attached, placement could not be confirmed due to current cardiac rhythm.       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 Vancomycin --> Daptomycin on  10/15/21   not meeting sepsis criteria as of 10/11/21  Echocardiogram showed NO vegetations on TTE, should not need TEE since won't change IV abx course.  Neurosurgical input via secure chat 05/26: MRI no discrete abscess, and no neurologic deficits to warrant emergent intervention, will continue neurologic checks q shift and prn, neurosurgery advises no intervention / no surgical procedure at this time since unlikely to drain anything, if needed could consider IR to sample  Pain control with toradol and oxycodone  Repeat (+)blood cultures 10/13/21, ID following, have asked neurosurgery to reevaluate (secure chat to [name] 05/29) Repeat MRI with some progression of infection but neurosurgery would like to continue with conservative management. Repeat blood cultures were done 6/1 remain negative in 5 days 6/7 PICC placed.  Continue daptomycin 800 mg IV daily with end date on 11/28/2021 6/8 dc toradol. Continue with oxyc. And tylenol for pain.     Septic arthritis of cervical spine ([place]) Continue with daptomycin as above Ck bmp today   History of intravenous drug abuse ([place]) States last use was more than a year  ago. Had a long discussion again to avoid missed use of PICC line, per patient he is done with drug abuse and staying sober since he moved out of [place]. 6/8 pt was counseled on abstinence during hospitalization    Hypokalemia resolved   DVT prophylaxis: ambulating,lovenox Code Status:full Family Communication: none Disposition Plan:  Status is: Inpatient Remains inpatient appropriate because: iv tx. Pt  with h/xo IVDU , cannot dc on picc.        LOS: 14 days   Time spent: 35 min    Nolberto Hanlon, MD [facility] Pager [phone]  If 7PM-7AM, please contact night-coverage 10/24/2021, 2:02 PM

## 2021-10-24 NOTE — Progress Notes (Signed)
   Date of Admission:  10/10/2021    Date of first neg culture 10/17/21   Subjective: Pt states his pain is not well controlled It is worse the past 2 days He is off toradol  Medications:   Chlorhexidine Gluconate Cloth  6 each Topical Daily   enoxaparin (LOVENOX) injection  40 mg Subcutaneous Q24H   lidocaine  2 patch Transdermal Q24H   orphenadrine  60 mg Intravenous Q12H   polyethylene glycol  17 g Oral Daily   sodium chloride flush  10-40 mL Intracatheter Q12H    Objective: Vital signs in last 24 hours: Patient Vitals for the past 24 hrs:  BP Temp Temp src Pulse Resp SpO2  10/24/21 1627 114/73 98.1 F (36.7 C) Oral 70 18 100 %  10/24/21 0858 111/71 97.9 F (36.6 C) -- 68 18 100 %  10/24/21 0458 108/76 (!) 97.5 F (36.4 C) -- (!) 56 17 100 %  10/23/21 1937 121/71 98 F (36.7 C) -- 68 17 100 %     PHYSICAL EXAM:  General: Alert, cooperative, some distress, depressed Lungs: Clear to auscultation bilaterally. No Wheezing or Rhonchi. No rales. Heart: Regular rate and rhythm, no murmur, rub or gallop. Abdomen: Soft, non-tender,not distended. Bowel sounds normal. No masses Extremities: atraumatic, no cyanosis. No edema. No clubbing Skin: No rashes or lesions. Or bruising Lymph: Cervical, supraclavicular normal. 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 Seen by neurosurgeon No surgical intervention y Patient is on dual MRSA coverage  with daptomycin and ceftaroline. Repeat blood cultures from 6/1 no growth so far On Dual MRSA coverage Day 8  .Will DC ceftarolin on day 10  Pain not well controlled the past 48 hrs- off toradol Recommend starting on round the clock NSAID and not wait for him to have break thru pain by giving PRN  PICC was placed as there was a plan for possible discharge It may be prudent to control his pain well , as there is a risk for reverting to IVDA if pain is not well controlled  Will need minimum of 6 weeks  of IV antibiotic 11/28/21 and oral antibiotic after that.   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 He has a job at [employer]. But said he would be safe in the hospital Spoke to his mother at bedside.    Discussed the management with the patient and hospitalist

## 2021-10-25 MED ORDER — HYDROCODONE-ACETAMINOPHEN 10-325 MG PO TABS
1.0000 | ORAL_TABLET | ORAL | Status: DC | PRN
  Administered 2021-10-25 – 2021-11-05 (×54): 1 via ORAL
  Filled 2021-10-25 (×54): qty 1

## 2021-10-25 MED ORDER — DICLOFENAC SODIUM 1 % EX GEL
2.0000 g | Freq: Four times a day (QID) | CUTANEOUS | Status: DC
  Administered 2021-10-25 – 2021-11-17 (×69): 2 g via TOPICAL
  Filled 2021-10-25 (×2): qty 100

## 2021-10-25 NOTE — Progress Notes (Signed)
PROGRESS NOTE    Cameron Hammond  TKP:[id number] DOB: 12-31-1975 DOA: 10/10/2021 PCP: Patient, No Pcp Per (Inactive)    Brief Narrative:  Taken from prior notes.  Cameron Hammond is a 46 year old male PMH back pain, IVDU last use 6 months prior to admission (varying history on this).  Presented to ED 10/10/21 complaining of pain in head, neck, upper back x1 week, no relief with chiropractor. 05/25: In ED CT head/cervical spine and CXR negative, CTA chest no PE, paraspinal soft tissue thickening extending from T2-T5 concerning for osteomyelitis.  WBC 17.3.  Started on vancomycin, Zosyn.  Vital signs initially normal, later transient tachycardia/tachypnea, presumably due to pain, requiring IV Dilaudid for pain control.   MRI thoracic and cervical spine: Discitis/osteomyelitis at 2 to T3, extensive adjacent anterior and lateral paraspinal phlegmon extending to C6 and to T6, dorsal and ventral phlegmon T2-T4 ventrally and T1-T5 dorsally, probable septic arthritis of the right C2-C3 facet joint with adjacent facet osteomyelitis and right paraspinal soft tissue inflammation, right sided ventral and lateral phlegmon C2-C3.   MRSA on blood culture 3 of 4 bottles, Zosyn was d/c, vancomycin was continued. 05/26: WBC trending down to 15.4. ID advising neurosurgery consult to eval for washout/procedure to reduce bioburden. TEE may not be needed since will not change therapy of at least 6 weeks abx IV. Repeat BCx in 48 hours to eval for clearance. Neurosurgery reviewed and communicated w/ me via secure chat, no plan at this point for surgical intervention.  05/27: pt reporting pain, see nurse notes from overnight. Otherwise remains of IV abx, ID to peripherally follow through the weekend (today is Saturday, Monday is Memorial Day). UTox (+)opiates (from inpatient administration meds), cannabinoids, tricyclics.  05/28: pain better on scheduled Norco 10 q6h, repeat cultures pending 05/29: (+) repeat blood cultures, ID  following, changed abx to daptomycin. I have asked neurosurgery to reevaluate (Cameron Hammond) 05/30: No plans for NS or IR to go in and wash/debride. Contineu IV antibitoics per ID and repeat cultures per ID. May need long term IV Abx vs repeat imaging if persistent (+)Cx   5/31: [name].  Norco was added with Toradol.  ID is planning to repeat blood cultures tomorrow and if remain positive we will reimage.   6/1: Repeat blood cultures were obtained today.  MRI repeated yesterday which shows some progression of septic arthritis, discitis and osteomyelitis with some microabscesses. Per neurosurgery he still does not have any indication for surgical intervention and they were recommending continuing medical therapy.   6/2: Repeat blood cultures done yesterday remain negative in 24 hours.  Pain seems to be well controlled with Toradol and Norco.  No surgical intervention required at this time per neurosurgery.  Patient will remain in hospital for a long time to complete at least a 6 weeks course of IV antibiotics.   6/3: [place] 6/1 NGRD. No change in care plan.    6/5: Repeat blood cultures done on 10/17/2021 remain negative.  CRP improved to 3.  Clinically stable.    6/6: Blood cultures from 10/17/2021 remain negative for 5 days.  ESR improving and leukocytosis has been resolved.  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  PICC line ordered and ID is recommending outpatient IV daptomycin for 6 weeks with end date of 11/28/2021. TOC is trying to find a  TEFL teacher for home health. Patient also need assistance with medications.  Pharmacy is aware  6/7 c/o of pain. No sob or cp. Ambulating in hallway. Picc placed today 6/8 complaining of pain between shoulder blades. Would like his pain meds closer to eachother. I explainted to pt that plan  is to dc iv toradol as he has been on it for a period and also cannot be going home eventually with iv pain meds. Explained he will need to utilize oxycodone and tylenol more for now with taper to only tylenol.  6/9 patient complaining of neck pain however he feels the combination he is on currently is working for him.  I did explain to him about Toradol and ibuprofen effects on the kidneys and will need to monitor his renal function closely.  Also told patient I would like at some point to start decreasing his ibuprofen dosing.  He is in agreement with the plan.  Also discussed Eventually he will need to be tapered off of oxycodone . He is ambulating in the hallway today with no restrictioins.  Consultants:  ID.  Neurosurgery  Procedures:   Antimicrobials:  ceftaroline   Subjective: No sob, dizziness.   Objective: Vitals:   10/24/21 1627 10/24/21 1932 10/25/21 0650 10/25/21 0834  BP: 114/73 106/63 112/73 100/64  Pulse: 70 65 63 (!) 55  Resp: _0 Temp: 98.1 F (36.7 C) 98.4 F (36.9 C) 97.7 F (36.5 C) 98.1 F (36.7 C)  TempSrc: Oral     SpO2: 100% 99% 98% 93%  Weight:      Height:        Intake/Output Summary (Last 24 hours) at 10/25/2021 0835 Last data filed at 10/25/2021 0446 Gross per 24 hour  Intake 240 ml  Output 1650 ml  Net -1410 ml   Filed Weights   10/10/21 1152 10/20/21 1202  Weight: 79.4 kg 74.3 kg    Examination: Calm, NAD Cta no w/r Reg s1/s2 no gallop Soft benign +bs No edema Aaoxox3  Mood and affect appropriate in current setting     Data Reviewed: I have personally reviewed following labs and imaging studies  CBC: Recent Labs  Lab 10/22/21 0603  WBC 7.8  NEUTROABS 5.2  HGB 11.1*  HCT 33.5*  MCV 88.6  PLT 031*   Basic Metabolic Panel: Recent Labs  Lab 10/19/21 0505 10/20/21 1252 10/24/21 1526  NA  --  141 137  K  --  4.4 4.5  CL  --  107 103  CO2  --  27 26  GLUCOSE  --  90 110*  BUN  --  13 21*  CREATININE 0.71  0.86 1.13  CALCIUM  --  9.0 9.4   GFR: Estimated Creatinine Clearance: 77.2 mL/min (by C-G formula based on SCr of 1.13 mg/dL). Liver Function Tests: No results for input(s): "AST", "ALT", "ALKPHOS", "BILITOT", "PROT", "ALBUMIN" in the last 168 hours. No results for input(s): "LIPASE", "AMYLASE" in the last 168 hours. No results for input(s): "AMMONIA" in the last 168 hours. Coagulation Profile: No results for input(s): "INR", "PROTIME" in the last 168 hours. Cardiac Enzymes: Recent Labs  Lab 10/21/21 0631  CKTOTAL 39*   BNP (last 3 results) No results for input(s): "PROBNP" in the last 8760 hours. HbA1C: No results for input(s): "HGBA1C" in the last 72 hours. CBG: No results for input(s): "GLUCAP" in the last 168 hours.  Lipid Profile: No results for input(s): "CHOL", "HDL", "LDLCALC", "TRIG", "CHOLHDL", "LDLDIRECT" in the last  72 hours. Thyroid Function Tests: No results for input(s): "TSH", "T4TOTAL", "FREET4", "T3FREE", "THYROIDAB" in the last 72 hours. Anemia Panel: No results for input(s): "VITAMINB12", "FOLATE", "FERRITIN", "TIBC", "IRON", "RETICCTPCT" in the last 72 hours. Sepsis Labs: No results for input(s): "PROCALCITON", "LATICACIDVEN" in the last 168 hours.  Recent Results (from the past 240 hour(s))  Culture, blood (Routine X 2) w Reflex to ID Panel     Status: None   Collection Time: 10/17/21  4:06 AM   Specimen: BLOOD  Result Value Ref Range Status   Specimen Description BLOOD LEFT [facility]  Final   Special Requests   Final    BOTTLES DRAWN AEROBIC AND ANAEROBIC Blood Culture adequate volume   Culture   Final    NO GROWTH 5 DAYS Performed at [facility], [address], [place], [place] [address]    Report Status 10/22/2021 FINAL  Final  Culture, blood (Routine X 2) w Reflex to ID Panel     Status: None   Collection Time: 10/17/21  4:06 AM   Specimen: BLOOD  Result Value Ref Range Status   Specimen Description BLOOD LEFT HAND  Final   Special  Requests   Final    BOTTLES DRAWN AEROBIC AND ANAEROBIC Blood Culture adequate volume   Culture   Final    NO GROWTH 5 DAYS Performed at [facility], [address], [place], [place] [address]    Report Status 10/22/2021 FINAL  Final         Radiology Studies: No results found.      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 Vancomycin --> Daptomycin on  10/15/21   not meeting sepsis criteria as of 10/11/21  Echocardiogram showed NO vegetations on TTE, should not need TEE since won't change IV abx course.  Neurosurgical input via secure chat 05/26: MRI no discrete abscess, and no neurologic deficits to warrant emergent intervention, will continue neurologic checks q shift and prn, neurosurgery advises no intervention / no surgical procedure at this time since unlikely to drain anything, if needed could consider IR to sample  Pain control with toradol and oxycodone  Repeat (+)blood cultures 10/13/21, ID following, have asked neurosurgery to reevaluate (secure chat to [name] 05/29) Repeat MRI with some progression of infection but  neurosurgery would like to continue with conservative management. Repeat blood cultures were done 6/1 remain negative in 5 days 6/7 PICC placed.  Continue daptomycin 800 mg IV daily with end date on 11/28/2021 6/8 dc toradol. Continue with oxyc. And tylenol for pain.  6/9 No Toradol. Added ibuprofen and vol. Gel. Refuses lidocaine patch. Need to monitor renal function closely  on ibuprofen and as he received toradol for a long period of time    Septic arthritis of cervical spine ([place]) Continue with daptomycin as above  6/9 bmp stable.   History of intravenous drug abuse ([place]) States last use was more than a year ago. Had a long discussion again to avoid missed use of PICC line, per patient he is done with drug abuse and staying sober since he moved out of [place]. 6/9 patient counseled on abstinence from hospitalization       Hypokalemia Resolved   DVT prophylaxis: ambulating,lovenox Code Status:full Family Communication: none Disposition Plan:  Status is: Inpatient Remains inpatient appropriate because: iv tx. Pt with h/xo IVDU , cannot dc on picc.        LOS: 15 days   Time spent: 35 min    Nolberto Hanlon, MD [facility] Pager [phone]  If 7PM-7AM, please contact night-coverage 10/25/2021, 8:35 AM

## 2021-10-25 NOTE — Progress Notes (Signed)
ID Back pain better controlled today In better mood  O/E awake and alert Patient Vitals for the past 24 hrs:  BP Temp Temp src Pulse Resp SpO2  10/25/21 1149 112/64 98.2 F (36.8 C) -- 72 16 100 %  10/25/21 0834 100/64 98.1 F (36.7 C) -- (!) 55 14 93 %  10/25/21 0650 112/73 97.7 F (36.5 C) -- 63 17 98 %  10/24/21 1932 106/63 98.4 F (36.9 C) -- 65 17 99 %  10/24/21 1627 114/73 98.1 F (36.7 C) Oral 70 18 100 %   Chest cta Hss1s2 Abd soft CNS non fpocal  Labs    Latest Ref Rng & Units 10/22/2021    6:03 AM 10/15/2021    2:20 PM 10/14/2021    2:46 AM  CBC  WBC 4.0 - 10.5 K/uL 7.8  13.3  11.7   Hemoglobin 13.0 - 17.0 g/dL 11.1  11.9  10.7   Hematocrit 39.0 - 52.0 % 33.5  35.6  31.4   Platelets 150 - 400 K/uL 584  574  398        Latest Ref Rng & Units 10/24/2021    3:26 PM 10/20/2021   12:52 PM 10/19/2021    5:05 AM  CMP  Glucose 70 - 99 mg/dL 110  90    BUN 6 - 20 mg/dL 21  13    Creatinine 0.61 - 1.24 mg/dL 1.13  0.86  0.71   Sodium 135 - 145 mmol/L 137  141    Potassium 3.5 - 5.1 mmol/L 4.5  4.4    Chloride 98 - 111 mmol/L 103  107    CO2 22 - 32 mmol/L 26  27    Calcium 8.9 - 10.3 mg/dL 9.4  9.0      Micro 10/10/21 BC MRSA 10/13/21 BC- MRSA 6/1 2 sets ng ESR 5/29 128 6/6 71  Impression/recommendation MRSA bacteremia with MRSA spine infection with  T2-T3 osteomyelitis and discits and associated epidural phlegmon . Paraspinous phlegmon t2-T3 and T3-T4  C6-T6 extn Seen by neurosurgery earlier on- no intervention other than antibiotics On dapto and ceftaroline- Will DC ceftaroline after tomorrow Because of h/o IVDA with conflicting last time use - will complete 6 weeks in the hospital- 11/28/21  Discussed the management with the patient ID will follow him peripherally this weekend- call if needed

## 2021-10-26 LAB — CREATININE, SERUM
Creatinine, Ser: 0.89 mg/dL (ref 0.61–1.24)
GFR, Estimated: >60 mL/min (ref >60)

## 2021-10-26 NOTE — Plan of Care (Signed)

## 2021-10-26 NOTE — Progress Notes (Signed)
PROGRESS NOTE    Cameron Hammond  YBW:[id number] DOB: Jul 13, 1975 DOA: 10/10/2021 PCP: Patient, No Pcp Per (Inactive)    Brief Narrative:  Taken from prior notes.  Cameron Hammond is a 46 year old male PMH back pain, IVDU last use 6 months prior to admission (varying history on this).  Presented to ED 10/10/21 complaining of pain in head, neck, upper back x1 week, no relief with chiropractor. 05/25: In ED CT head/cervical spine and CXR negative, CTA chest no PE, paraspinal soft tissue thickening extending from T2-T5 concerning for osteomyelitis.  WBC 17.3.  Started on vancomycin, Zosyn.  Vital signs initially normal, later transient tachycardia/tachypnea, presumably due to pain, requiring IV Dilaudid for pain control.   MRI thoracic and cervical spine: Discitis/osteomyelitis at 2 to T3, extensive adjacent anterior and lateral paraspinal phlegmon extending to C6 and to T6, dorsal and ventral phlegmon T2-T4 ventrally and T1-T5 dorsally, probable septic arthritis of the right C2-C3 facet joint with adjacent facet osteomyelitis and right paraspinal soft tissue inflammation, right sided ventral and lateral phlegmon C2-C3.   MRSA on blood culture 3 of 4 bottles, Zosyn was d/c, vancomycin was continued. 05/26: WBC trending down to 15.4. ID advising neurosurgery consult to eval for washout/procedure to reduce bioburden. TEE may not be needed since will not change therapy of at least 6 weeks abx IV. Repeat BCx in 48 hours to eval for clearance. Neurosurgery reviewed and communicated w/ me via secure chat, no plan at this point for surgical intervention.  05/27: pt reporting pain, see nurse notes from overnight. Otherwise remains of IV abx, ID to peripherally follow through the weekend (today is Saturday, Monday is Memorial Day). UTox (+)opiates (from inpatient administration meds), cannabinoids, tricyclics.  05/28: pain better on scheduled Norco 10 q6h, repeat cultures pending 05/29: (+) repeat blood cultures, ID  following, changed abx to daptomycin. I have asked neurosurgery to reevaluate ([name]) 05/30: No plans for NS or IR to go in and wash/debride. Contineu IV antibitoics per ID and repeat cultures per ID. May need long term IV Abx vs repeat imaging if persistent (+)Cx   5/31: [name].  Norco was added with Toradol.  ID is planning to repeat blood cultures tomorrow and if remain positive we will reimage.   6/1: Repeat blood cultures were obtained today.  MRI repeated yesterday which shows some progression of septic arthritis, discitis and osteomyelitis with some microabscesses. Per neurosurgery he still does not have any indication for surgical intervention and they were recommending continuing medical therapy.   6/2: Repeat blood cultures done yesterday remain negative in 24 hours.  Pain seems to be well controlled with Toradol and Norco.  No surgical intervention required at this time per neurosurgery.  Patient will remain in hospital for a long time to complete at least a 6 weeks course of IV antibiotics.   6/3: [place] 6/1 NGRD. No change in care plan.    6/5: Repeat blood cultures done on 10/17/2021 remain negative.  CRP improved to 3.  Clinically stable.    6/6: Blood cultures from 10/17/2021 remain negative for 5 days.  ESR improving and leukocytosis has been resolved.  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  PICC line ordered and ID is recommending outpatient IV daptomycin for 6 weeks with end date of 11/28/2021. TOC is trying to find a  TEFL teacher for home health. Patient also need assistance with medications.  Pharmacy is aware  6/7 c/o of pain. No sob or cp. Ambulating in hallway. Picc placed today 6/8 complaining of pain between shoulder blades. Would like his pain meds closer to eachother. I explainted to pt that plan  is to dc iv toradol as he has been on it for a period and also cannot be going home eventually with iv pain meds. Explained he will need to utilize oxycodone and tylenol more for now with taper to only tylenol.  6/9 patient complaining of neck pain however he feels the combination he is on currently is working for him.  I did explain to him about Toradol and ibuprofen effects on the kidneys and will need to monitor his renal function closely.  Also told patient I would like at some point to start decreasing his ibuprofen dosing.  He is in agreement with the plan.  Also discussed Eventually he will need to be tapered off of oxycodone . He is ambulating in the hallway today with no restrictioins.  6/10 pt complaining of his "work excuse note given" that had punctuation placed incorrectly. Quiet angry this am. Offered to redo his note and explained we use a template for this , and he declined. Charge nurse Helene Kelp was present during this session  Consultants:  ID.  Neurosurgery  Procedures:   Antimicrobials:  ceftaroline   Subjective: No compliants of pain, sob, cp  Objective: Vitals:   10/25/21 1149 10/25/21 1555 10/25/21 1943 10/26/21 0424  BP: 112/64 105/66 112/69 107/76  Pulse: 72 70 71 66  Resp: _0 Temp: 98.2 F (36.8 C) (!) 97.5 F (36.4 C) 98.6 F (37 C) 97.7 F (36.5 C)  TempSrc:   Oral   SpO2: 100% 100% 97% 100%  Weight:      Height:        Intake/Output Summary (Last 24 hours) at 10/26/2021 0840 Last data filed at 10/25/2021 2145 Gross per 24 hour  Intake 670 ml  Output --  Net 670 ml   Filed Weights   10/10/21 1152 10/20/21 1202  Weight: 79.4 kg 74.3 kg    Examination: Calm, NAD Cta no w/r Reg s1/s2 no gallop Soft benign +bs No edema Aaoxox3  Mood and affect appropriate in current setting     Data Reviewed: I have personally reviewed following labs and imaging studies  CBC: Recent Labs  Lab 10/22/21 0603  WBC 7.8  NEUTROABS 5.2  HGB  11.1*  HCT 33.5*  MCV 88.6  PLT 825*   Basic Metabolic Panel: Recent Labs  Lab 10/20/21 1252 10/24/21 1526 10/26/21 0530  NA 141 137  --   K 4.4 4.5  --   CL 107 103  --   CO2 27 26  --   GLUCOSE 90 110*  --   BUN 13 21*  --   CREATININE 0.86 1.13 0.89  CALCIUM 9.0 9.4  --    GFR: Estimated Creatinine Clearance: 98 mL/min (by C-G formula based on SCr of 0.89 mg/dL). Liver Function Tests: No results for input(s): "AST", "ALT", "ALKPHOS", "BILITOT", "PROT", "ALBUMIN" in the last 168 hours. No results for input(s): "LIPASE", "AMYLASE" in the last 168 hours. No results for input(s): "AMMONIA" in the last 168 hours. Coagulation Profile: No results for input(s): "INR", "PROTIME" in the last 168 hours. Cardiac Enzymes: Recent Labs  Lab 10/21/21 0631  CKTOTAL 39*   BNP (last 3 results) No results for  input(s): "PROBNP" in the last 8760 hours. HbA1C: No results for input(s): "HGBA1C" in the last 72 hours. CBG: No results for input(s): "GLUCAP" in the last 168 hours.  Lipid Profile: No results for input(s): "CHOL", "HDL", "LDLCALC", "TRIG", "CHOLHDL", "LDLDIRECT" in the last 72 hours. Thyroid Function Tests: No results for input(s): "TSH", "T4TOTAL", "FREET4", "T3FREE", "THYROIDAB" in the last 72 hours. Anemia Panel: No results for input(s): "VITAMINB12", "FOLATE", "FERRITIN", "TIBC", "IRON", "RETICCTPCT" in the last 72 hours. Sepsis Labs: No results for input(s): "PROCALCITON", "LATICACIDVEN" in the last 168 hours.  Recent Results (from the past 240 hour(s))  Culture, blood (Routine X 2) w Reflex to ID Panel     Status: None   Collection Time: 10/17/21  4:06 AM   Specimen: BLOOD  Result Value Ref Range Status   Specimen Description BLOOD LEFT [facility]  Final   Special Requests   Final    BOTTLES DRAWN AEROBIC AND ANAEROBIC Blood Culture adequate volume   Culture   Final    NO GROWTH 5 DAYS Performed at [facility], [address] [place], [place] [address]     Report Status 10/22/2021 FINAL  Final  Culture, blood (Routine X 2) w Reflex to ID Panel     Status: None   Collection Time: 10/17/21  4:06 AM   Specimen: BLOOD  Result Value Ref Range Status   Specimen Description BLOOD LEFT HAND  Final   Special Requests   Final    BOTTLES DRAWN AEROBIC AND ANAEROBIC Blood Culture adequate volume   Culture   Final    NO GROWTH 5 DAYS Performed at [facility], [address], [place], [place] [address]    Report Status 10/22/2021 FINAL  Final         Radiology Studies: No results found.      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 Vancomycin --> Daptomycin on  10/15/21   not meeting sepsis criteria as of 10/11/21  Echocardiogram showed NO vegetations on TTE, should not need TEE since won't change IV abx course.  Neurosurgical input via secure chat 05/26: MRI no discrete abscess, and no neurologic deficits to warrant emergent intervention, will continue neurologic checks q shift and prn, neurosurgery advises no intervention / no surgical procedure at this time since  unlikely to drain anything, if needed could consider IR to sample  Pain control with toradol and oxycodone  Repeat (+)blood cultures 10/13/21, ID following, have asked neurosurgery to reevaluate (secure chat to [name] 05/29) Repeat MRI with some progression of infection but neurosurgery would like to continue with conservative  management. Repeat blood cultures were done 6/1 remain negative in 5 days 6/7 PICC placed.  Continue daptomycin 800 mg IV daily with end date on 11/28/2021 6/8 dc toradol. Continue with oxyc. And tylenol for pain.  6/9 No Toradol. Added ibuprofen and vol. Gel. Refuses lidocaine patch. Need to monitor renal function closely on ibuprofen and as he received toradol for a long period of time 6/10 appears pain is controlled. Will continue current regimen    Septic arthritis of cervical spine ([place]) Continue with daptomycin as above 6/10 labs reveiwed and stable   History of intravenous drug abuse ([place]) States last use was more than a year ago. Had a long discussion again to avoid missed use of PICC line, per patient he is done with drug abuse and staying sober since he moved out of [place]. 6/10 patient counseled on abstinence during his hospitalization        Hypokalemia resolved   DVT prophylaxis: ambulating,lovenox Code Status:full Family Communication: none Disposition Plan:  Status is: Inpatient Remains inpatient appropriate because: iv tx. Pt with h/xo IVDU , cannot dc on picc.        LOS: 16 days   Time spent: 35 min    Nolberto Hanlon, MD [facility] Pager [phone]  If 7PM-7AM, please contact night-coverage 10/26/2021, 8:40 AM

## 2021-10-26 NOTE — Progress Notes (Signed)
Pharmacy Antibiotic Note  Cameron Hammond is a 46 y.o. male admitted on 10/10/2021 with sepsis d/t MRSA bacteremia.  Pharmacy has been consulted for Daptomycin dosing.  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    No Known Allergies  Antimicrobials this admission: Zosyn 5/25>>5/25 vancomycin 5/25 >> 5/29 5/30 Daptomycin >>  Ceftaroline 5/31>>   (thru 6/10)  Microbiology results: -6/1 repeat Bcx: NG x 5d -5/28 repeat Bcx 3of4: still MRSA + -5/25:Bcx 4of4 : MRSA+   ID consult(will need 6 weeks abx)  MRI : Discitis-osteomyelitis at T2-T3,  with extensive adjacent anterior and lateral paraspinal phlegmon  Thank you for allowing pharmacy to be a part of this patient's care.  8/10 PharmD Clinical Pharmacist 10/26/2021

## 2021-10-27 NOTE — Progress Notes (Signed)
PROGRESS NOTE    Cameron Hammond  YBW:[id number] DOB: Jul 13, 1975 DOA: 10/10/2021 PCP: Patient, No Pcp Per (Inactive)    Brief Narrative:  Taken from prior notes.  Cameron Hammond is a 46 year old male PMH back pain, IVDU last use 6 months prior to admission (varying history on this).  Presented to ED 10/10/21 complaining of pain in head, neck, upper back x1 week, no relief with chiropractor. 05/25: In ED CT head/cervical spine and CXR negative, CTA chest no PE, paraspinal soft tissue thickening extending from T2-T5 concerning for osteomyelitis.  WBC 17.3.  Started on vancomycin, Zosyn.  Vital signs initially normal, later transient tachycardia/tachypnea, presumably due to pain, requiring IV Dilaudid for pain control.   MRI thoracic and cervical spine: Discitis/osteomyelitis at 2 to T3, extensive adjacent anterior and lateral paraspinal phlegmon extending to C6 and to T6, dorsal and ventral phlegmon T2-T4 ventrally and T1-T5 dorsally, probable septic arthritis of the right C2-C3 facet joint with adjacent facet osteomyelitis and right paraspinal soft tissue inflammation, right sided ventral and lateral phlegmon C2-C3.   MRSA on blood culture 3 of 4 bottles, Zosyn was d/c, vancomycin was continued. 05/26: WBC trending down to 15.4. ID advising neurosurgery consult to eval for washout/procedure to reduce bioburden. TEE may not be needed since will not change therapy of at least 6 weeks abx IV. Repeat BCx in 48 hours to eval for clearance. Neurosurgery reviewed and communicated w/ me via secure chat, no plan at this point for surgical intervention.  05/27: pt reporting pain, see nurse notes from overnight. Otherwise remains of IV abx, ID to peripherally follow through the weekend (today is Saturday, Monday is Memorial Day). UTox (+)opiates (from inpatient administration meds), cannabinoids, tricyclics.  05/28: pain better on scheduled Norco 10 q6h, repeat cultures pending 05/29: (+) repeat blood cultures, ID  following, changed abx to daptomycin. I have asked neurosurgery to reevaluate ([name]) 05/30: No plans for NS or IR to go in and wash/debride. Contineu IV antibitoics per ID and repeat cultures per ID. May need long term IV Abx vs repeat imaging if persistent (+)Cx   5/31: [name].  Norco was added with Toradol.  ID is planning to repeat blood cultures tomorrow and if remain positive we will reimage.   6/1: Repeat blood cultures were obtained today.  MRI repeated yesterday which shows some progression of septic arthritis, discitis and osteomyelitis with some microabscesses. Per neurosurgery he still does not have any indication for surgical intervention and they were recommending continuing medical therapy.   6/2: Repeat blood cultures done yesterday remain negative in 24 hours.  Pain seems to be well controlled with Toradol and Norco.  No surgical intervention required at this time per neurosurgery.  Patient will remain in hospital for a long time to complete at least a 6 weeks course of IV antibiotics.   6/3: [place] 6/1 NGRD. No change in care plan.    6/5: Repeat blood cultures done on 10/17/2021 remain negative.  CRP improved to 3.  Clinically stable.    6/6: Blood cultures from 10/17/2021 remain negative for 5 days.  ESR improving and leukocytosis has been resolved.  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  PICC line ordered and ID is recommending outpatient IV daptomycin for 6 weeks with end date of 11/28/2021. TOC is trying to find a  TEFL teacher for home health. Patient also need assistance with medications.  Pharmacy is aware  6/7 c/o of pain. No sob or cp. Ambulating in hallway. Picc placed today 6/8 complaining of pain between shoulder blades. Would like his pain meds closer to eachother. I explainted to pt that plan  is to dc iv toradol as he has been on it for a period and also cannot be going home eventually with iv pain meds. Explained he will need to utilize oxycodone and tylenol more for now with taper to only tylenol.  6/9 patient complaining of neck pain however he feels the combination he is on currently is working for him.  I did explain to him about Toradol and ibuprofen effects on the kidneys and will need to monitor his renal function closely.  Also told patient I would like at some point to start decreasing his ibuprofen dosing.  He is in agreement with the plan.  Also discussed Eventually he will need to be tapered off of oxycodone . He is ambulating in the hallway today with no restrictioins.  6/10 pt complaining of his "work excuse note given" that had punctuation placed incorrectly. Quiet angry this am. Offered to redo his note and explained we use a template for this , and he declined. Charge nurse Helene Kelp was present during this session  6/11 reports  better controlled now.  States is usually between 4 and 7 and only with ambulation.  No other acute issues  Consultants:  ID.  Neurosurgery  Procedures:   Antimicrobials:  ceftaroline   Subjective: Ambulating in the hallway.  No shortness of breath or chest pain.  Objective: Vitals:   10/26/21 1609 10/26/21 1940 10/27/21 0419 10/27/21 0802  BP: 116/62 107/70 109/72 110/84  Pulse: 62 72 64 71  Resp:  $Remo'18 17 16  'Wibsx$ Temp: 97.9 F (36.6 C) 98.2 F (36.8 C) 97.7 F (36.5 C) 97.7 F (36.5 C)  TempSrc:  Oral Oral   SpO2: 100% 98% 100% 100%  Weight:      Height:        Intake/Output Summary (Last 24 hours) at 10/27/2021 0851 Last data filed at 10/26/2021 1944 Gross per 24 hour  Intake 490 ml  Output --  Net 490 ml   Filed Weights   10/10/21 1152 10/20/21 1202  Weight: 79.4 kg 74.3 kg    Examination: Calm, NAD Cta no w/r Reg s1/s2 no gallop Soft benign +bs No edema Aaoxox3  Mood and affect appropriate in current setting      Data Reviewed: I have personally reviewed following labs and imaging studies  CBC: Recent Labs  Lab 10/22/21 0603  WBC 7.8  NEUTROABS 5.2  HGB 11.1*  HCT 33.5*  MCV 88.6  PLT 254*   Basic Metabolic Panel: Recent Labs  Lab 10/20/21 1252 10/24/21 1526 10/26/21 0530  NA 141 137  --   K 4.4 4.5  --   CL 107 103  --   CO2 27 26  --   GLUCOSE 90 110*  --   BUN 13 21*  --   CREATININE 0.86 1.13 0.89  CALCIUM 9.0 9.4  --    GFR: Estimated Creatinine Clearance: 98 mL/min (by C-G formula based on SCr of 0.89 mg/dL). Liver Function Tests: No results for input(s): "AST", "ALT", "ALKPHOS", "BILITOT", "PROT", "ALBUMIN" in the last 168 hours. No results for input(s): "LIPASE", "AMYLASE" in the last 168 hours. No results for input(s): "AMMONIA" in the last 168 hours. Coagulation Profile: No results  for input(s): "INR", "PROTIME" in the last 168 hours. Cardiac Enzymes: Recent Labs  Lab 10/21/21 0631  CKTOTAL 39*   BNP (last 3 results) No results for input(s): "PROBNP" in the last 8760 hours. HbA1C: No results for input(s): "HGBA1C" in the last 72 hours. CBG: No results for input(s): "GLUCAP" in the last 168 hours.  Lipid Profile: No results for input(s): "CHOL", "HDL", "LDLCALC", "TRIG", "CHOLHDL", "LDLDIRECT" in the last 72 hours. Thyroid Function Tests: No results for input(s): "TSH", "T4TOTAL", "FREET4", "T3FREE", "THYROIDAB" in the last 72 hours. Anemia Panel: No results for input(s): "VITAMINB12", "FOLATE", "FERRITIN", "TIBC", "IRON", "RETICCTPCT" in the last 72 hours. Sepsis Labs: No results for input(s): "PROCALCITON", "LATICACIDVEN" in the last 168 hours.  No results found for this or any previous visit (from the past 240 hour(s)).        Radiology Studies: No results found.      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 Vancomycin --> Daptomycin on  10/15/21   not meeting sepsis criteria as of 10/11/21  Echocardiogram showed NO vegetations on TTE, should not need TEE since won't change IV abx course.  Neurosurgical input via secure chat 05/26: MRI no discrete abscess, and no neurologic deficits to warrant emergent intervention, will continue neurologic checks q shift and prn, neurosurgery advises no intervention / no surgical procedure at this time since unlikely to drain anything, if needed could consider IR to sample  Repeat (+)blood cultures 10/13/21, ID following, have asked neurosurgery to reevaluate (secure chat to [name] 05/29) Repeat MRI with some progression of infection but neurosurgery would like to continue with conservative management. Repeat blood cultures were done 6/1 remain negative in 5 days 6/7 PICC placed.  Continue daptomycin 800 mg IV daily with end date on 11/28/2021 6/8 dc toradol. Continue with oxyc. And tylenol for pain.  6/9 No Toradol. Added ibuprofen and vol. Gel. Refuses lidocaine patch. Need to monitor renal function closely on ibuprofen and as he received toradol for a long period of time 6/11 pain controlled on current regimen  Continue ambulating as tolerated    Septic arthritis of  cervical spine ([place]) Continue with daptomycin as above 6/11 labs reviewed and stable  Continue treatment as above      History of intravenous drug abuse ([place]) States last use was more than a year ago. Had a long discussion again to avoid missed use of PICC line, per patient he is done with drug abuse and staying sober since he moved out of [place]. 6/11 patient was counseled on abstinence during his hospitalization          Hypokalemia Resolved   DVT  prophylaxis: ambulating,lovenox Code Status:full Family Communication: none Disposition Plan:  Status is: Inpatient Remains inpatient appropriate because: iv tx. Pt with h/xo IVDU , cannot dc on picc.        LOS: 17 days   Time spent: 35 min    Nolberto Hanlon, MD [facility] Pager [phone]  If 7PM-7AM, please contact night-coverage 10/27/2021, 8:51 AM

## 2021-10-27 NOTE — Plan of Care (Signed)

## 2021-10-28 LAB — C-REACTIVE PROTEIN: CRP: 1 mg/dL — ABNORMAL HIGH (ref <1.0)

## 2021-10-28 LAB — SEDIMENTATION RATE: Sed Rate: 52 mm/hr — ABNORMAL HIGH (ref 0–15)

## 2021-10-28 LAB — CK: Total CK: 42 U/L — ABNORMAL LOW (ref 49–397)

## 2021-10-28 MED ORDER — CYCLOBENZAPRINE HCL 10 MG PO TABS
10.0000 mg | ORAL_TABLET | Freq: Three times a day (TID) | ORAL | Status: DC | PRN
Start: 2021-10-28 — End: 2021-11-18
  Administered 2021-10-28 – 2021-11-18 (×48): 10 mg via ORAL
  Filled 2021-10-28 (×53): qty 1

## 2021-10-28 NOTE — Plan of Care (Signed)

## 2021-10-28 NOTE — Progress Notes (Signed)
PROGRESS NOTE    Cameron Hammond  YBW:[id number] DOB: Jul 13, 1975 DOA: 10/10/2021 PCP: Patient, No Pcp Per (Inactive)    Brief Narrative:  Taken from prior notes.  Cameron Hammond is a 46 year old male PMH back pain, IVDU last use 6 months prior to admission (varying history on this).  Presented to ED 10/10/21 complaining of pain in head, neck, upper back x1 week, no relief with chiropractor. 05/25: In ED CT head/cervical spine and CXR negative, CTA chest no PE, paraspinal soft tissue thickening extending from T2-T5 concerning for osteomyelitis.  WBC 17.3.  Started on vancomycin, Zosyn.  Vital signs initially normal, later transient tachycardia/tachypnea, presumably due to pain, requiring IV Dilaudid for pain control.   MRI thoracic and cervical spine: Discitis/osteomyelitis at 2 to T3, extensive adjacent anterior and lateral paraspinal phlegmon extending to C6 and to T6, dorsal and ventral phlegmon T2-T4 ventrally and T1-T5 dorsally, probable septic arthritis of the right C2-C3 facet joint with adjacent facet osteomyelitis and right paraspinal soft tissue inflammation, right sided ventral and lateral phlegmon C2-C3.   MRSA on blood culture 3 of 4 bottles, Zosyn was d/c, vancomycin was continued. 05/26: WBC trending down to 15.4. ID advising neurosurgery consult to eval for washout/procedure to reduce bioburden. TEE may not be needed since will not change therapy of at least 6 weeks abx IV. Repeat BCx in 48 hours to eval for clearance. Neurosurgery reviewed and communicated w/ me via secure chat, no plan at this point for surgical intervention.  05/27: pt reporting pain, see nurse notes from overnight. Otherwise remains of IV abx, ID to peripherally follow through the weekend (today is Saturday, Monday is Memorial Day). UTox (+)opiates (from inpatient administration meds), cannabinoids, tricyclics.  05/28: pain better on scheduled Norco 10 q6h, repeat cultures pending 05/29: (+) repeat blood cultures, ID  following, changed abx to daptomycin. I have asked neurosurgery to reevaluate ([name]) 05/30: No plans for NS or IR to go in and wash/debride. Contineu IV antibitoics per ID and repeat cultures per ID. May need long term IV Abx vs repeat imaging if persistent (+)Cx   5/31: [name].  Norco was added with Toradol.  ID is planning to repeat blood cultures tomorrow and if remain positive we will reimage.   6/1: Repeat blood cultures were obtained today.  MRI repeated yesterday which shows some progression of septic arthritis, discitis and osteomyelitis with some microabscesses. Per neurosurgery he still does not have any indication for surgical intervention and they were recommending continuing medical therapy.   6/2: Repeat blood cultures done yesterday remain negative in 24 hours.  Pain seems to be well controlled with Toradol and Norco.  No surgical intervention required at this time per neurosurgery.  Patient will remain in hospital for a long time to complete at least a 6 weeks course of IV antibiotics.   6/3: [place] 6/1 NGRD. No change in care plan.    6/5: Repeat blood cultures done on 10/17/2021 remain negative.  CRP improved to 3.  Clinically stable.    6/6: Blood cultures from 10/17/2021 remain negative for 5 days.  ESR improving and leukocytosis has been resolved.  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  PICC line ordered and ID is recommending outpatient IV daptomycin for 6 weeks with end date of 11/28/2021. TOC is trying to find a  TEFL teacher for home health. Patient also need assistance with medications.  Pharmacy is aware  6/7 c/o of pain. No sob or cp. Ambulating in hallway. Picc placed today 6/8 complaining of pain between shoulder blades. Would like his pain meds closer to eachother. I explainted to pt that plan  is to dc iv toradol as he has been on it for a period and also cannot be going home eventually with iv pain meds. Explained he will need to utilize oxycodone and tylenol more for now with taper to only tylenol.  6/9 patient complaining of neck pain however he feels the combination he is on currently is working for him.  I did explain to him about Toradol and ibuprofen effects on the kidneys and will need to monitor his renal function closely.  Also told patient I would like at some point to start decreasing his ibuprofen dosing.  He is in agreement with the plan.  Also discussed Eventually he will need to be tapered off of oxycodone . He is ambulating in the hallway today with no restrictioins.  6/10 pt complaining of his "work excuse note given" that had punctuation placed incorrectly. Quiet angry this am. Offered to redo his note and explained we use a template for this , and he declined. Charge nurse Helene Kelp was present during this session  6/11 reports  better controlled now.  States is usually between 4 and 7 and only with ambulation.  No other acute issues 6/12 no complaints this AM but  would like a full dose of Flexeril.  Ambulating.  Pain controlled on current regimen.  Consultants:  ID.  Neurosurgery  Procedures:   Antimicrobials:  ceftaroline   Subjective: No shortness of breath or chest pain  Objective: Vitals:   10/27/21 1546 10/27/21 2056 10/28/21 0409 10/28/21 0838  BP: 124/75 115/68 104/71 121/84  Pulse: 76 70 65 74  Resp: _0 Temp: 97.9 F (36.6 C) 99.2 F (37.3 C) 98 F (36.7 C) 97.6 F (36.4 C)  TempSrc:   Oral   SpO2: 98% 97% 99% 100%  Weight:      Height:        Intake/Output Summary (Last 24 hours) at 10/28/2021 0845 Last data filed at 10/27/2021 2210 Gross per 24 hour  Intake 10 ml  Output --  Net 10 ml   Filed Weights   10/10/21 1152 10/20/21 1202  Weight: 79.4 kg 74.3 kg    Examination: Calm, NAD Cta no w/r Reg s1/s2 no gallop Soft  benign +bs No edema Aaoxox3  Mood and affect appropriate in current setting     Data Reviewed: I have personally reviewed following labs and imaging studies  CBC: Recent Labs  Lab 10/22/21 0603  WBC 7.8  NEUTROABS 5.2  HGB 11.1*  HCT 33.5*  MCV 88.6  PLT 885*   Basic Metabolic Panel: Recent Labs  Lab 10/24/21 1526 10/26/21 0530  NA 137  --   K 4.5  --   CL 103  --   CO2 26  --   GLUCOSE 110*  --   BUN 21*  --   CREATININE 1.13 0.89  CALCIUM 9.4  --    GFR: Estimated Creatinine Clearance: 98 mL/min (by C-G formula based on SCr of 0.89 mg/dL). Liver Function Tests: No results for input(s): "AST", "ALT", "ALKPHOS", "BILITOT", "PROT", "ALBUMIN" in the last 168 hours. No results for input(s): "LIPASE", "AMYLASE" in the last 168 hours. No results for input(s): "AMMONIA" in the  last 168 hours. Coagulation Profile: No results for input(s): "INR", "PROTIME" in the last 168 hours. Cardiac Enzymes: Recent Labs  Lab 10/28/21 0501  CKTOTAL 42*   BNP (last 3 results) No results for input(s): "PROBNP" in the last 8760 hours. HbA1C: No results for input(s): "HGBA1C" in the last 72 hours. CBG: No results for input(s): "GLUCAP" in the last 168 hours.  Lipid Profile: No results for input(s): "CHOL", "HDL", "LDLCALC", "TRIG", "CHOLHDL", "LDLDIRECT" in the last 72 hours. Thyroid Function Tests: No results for input(s): "TSH", "T4TOTAL", "FREET4", "T3FREE", "THYROIDAB" in the last 72 hours. Anemia Panel: No results for input(s): "VITAMINB12", "FOLATE", "FERRITIN", "TIBC", "IRON", "RETICCTPCT" in the last 72 hours. Sepsis Labs: No results for input(s): "PROCALCITON", "LATICACIDVEN" in the last 168 hours.  No results found for this or any previous visit (from the past 240 hour(s)).        Radiology Studies: No results found.      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 Vancomycin --> Daptomycin on  10/15/21   not meeting sepsis criteria as of 10/11/21  Echocardiogram showed NO vegetations on TTE, should not need TEE since won't change IV abx course.  Neurosurgical input via secure chat 05/26: MRI no discrete abscess, and no neurologic deficits to warrant emergent intervention, will continue neurologic checks q shift and prn, neurosurgery advises no intervention / no surgical procedure at this time since unlikely to drain anything, if needed could consider IR to sample  Repeat (+)blood cultures 10/13/21, ID following, have asked neurosurgery to reevaluate (secure chat to [name] 05/29) Repeat MRI with some progression of infection but neurosurgery would like to continue with conservative management. Repeat blood cultures were done 6/1 remain negative in 5 days 6/7 PICC placed.  Continue daptomycin 800 mg IV daily with end date on 11/28/2021 6/8 dc toradol. Continue with oxyc. And tylenol for pain.  6/9 No Toradol. Added ibuprofen and vol. Gel. Refuses lidocaine patch. Need to monitor renal function closely on ibuprofen and as he received toradol for a long period of time 6/12 pain controlled on current regimen Increase Flexeril to 10  mg as needed 3 times daily     Septic arthritis of cervical spine (HCC) Continue with daptomycin as above 6/12 continue current regimen     History of intravenous drug abuse ([place]) States last use was more than a year ago. Had a long discussion again to avoid missed use of PICC line, per patient he is done with drug abuse and staying sober since he moved out of [place]. 6/12 patient was counseled on abstinence during his hospitalization  Hypokalemia Resolved   DVT prophylaxis: ambulating,lovenox Code Status:full Family Communication: none Disposition Plan:  Status is: Inpatient Remains inpatient appropriate because: iv tx. Pt with h/xo IVDU , cannot dc on picc.        LOS: 18 days   Time spent: 35 min    Nolberto Hanlon, MD [facility] Pager [phone]  If 7PM-7AM, please contact night-coverage 10/28/2021, 8:45 AM

## 2021-10-28 NOTE — Progress Notes (Signed)
   Date of Admission:  10/10/2021   ID: Cameron Hammond is a 46 y.o. 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 Active Problems:   History of intravenous drug abuse (HCC)   Hypokalemia   Septic arthritis of cervical spine (HCC)    Subjective: Pt is improving Good days and bad days  Medications:   Chlorhexidine Gluconate Cloth  6 each Topical Daily   diclofenac Sodium  2 g Topical QID   enoxaparin (LOVENOX) injection  40 mg Subcutaneous Q24H   lidocaine  2 patch Transdermal Q24H   orphenadrine  60 mg Intravenous Q12H   polyethylene glycol  17 g Oral Daily   sodium chloride flush  10-40 mL Intracatheter Q12H    Objective: Vital signs in last 24 hours: Temp:  [97.6 F (36.4 C)-99.2 F (37.3 C)] 97.6 F (36.4 C) (06/12 0838) Pulse Rate:  [65-76] 74 (06/12 0838) Resp:  [15-17] 16 (06/12 0838) BP: (104-124)/(68-84) 121/84 (06/12 0838) SpO2:  [97 %-100 %] 100 % (06/12 0838)   PHYSICAL EXAM:  General: Alert, cooperative, no distress, appears stated age.  Head: Normocephalic, without obvious abnormality, atraumatic. Eyes: Conjunctivae clear, anicteric sclerae. Pupils are equal ENT Nares normal. No drainage or sinus tenderness. Lips, mucosa, and tongue normal. No Thrush Neck: Supple, symmetrical, no adenopathy, thyroid: non tender no carotid bruit and no JVD. Back: No CVA tenderness. Lungs: Clear to auscultation bilaterally. No Wheezing or Rhonchi. No rales. Heart: Regular rate and rhythm, no murmur, rub or gallop. Abdomen: Soft, non-tender,not distended. Bowel sounds normal. No masses Extremities: atraumatic, no cyanosis. No edema. No clubbing Skin: No rashes or lesions. Or bruising Lymph: Cervical, supraclavicular normal. Neurologic: Grossly non-focal  Lab Results Recent Labs    10/26/21 0530  CREATININE 0.89   Liver Panel No results for input(s): "PROT", "ALBUMIN", "AST", "ALT", "ALKPHOS", "BILITOT",  "BILIDIR", "IBILI" in the last 72 hours. Sedimentation Rate Recent Labs    10/28/21 0501  ESRSEDRATE 52*   C-Reactive Protein Recent Labs    10/28/21 0501  CRP 1.0*   CRP 20>3>1     Assessment/Plan: MRSA bacteremia and MRSA spine infection with T2-T3 osteomyelitis and discitis and associated epidural phlegmon. Paraspinous phlegmon T2-T3 and T3-T4 No neurosurgical intervention  Pt after getting 2 Anti MRSA antibiotics for 8 days , Ceftaroline was discontinued and currently on daptomycin Significant improvement in ESR/CRP  Pt will complete 6 weeks of Iv Daptomycin in the hospital on 11/28/21 H/o IVDA  Discussed the management with patient Will follow him peripherally

## 2021-10-29 NOTE — Progress Notes (Signed)
PROGRESS NOTE    Cameron Hammond  YBW:[id number] DOB: Jul 13, 1975 DOA: 10/10/2021 PCP: Patient, No Pcp Per (Inactive)    Brief Narrative:  Taken from prior notes.  Cameron Hammond is a 46 year old male PMH back pain, IVDU last use 6 months prior to admission (varying history on this).  Presented to ED 10/10/21 complaining of pain in head, neck, upper back x1 week, no relief with chiropractor. 05/25: In ED CT head/cervical spine and CXR negative, CTA chest no PE, paraspinal soft tissue thickening extending from T2-T5 concerning for osteomyelitis.  WBC 17.3.  Started on vancomycin, Zosyn.  Vital signs initially normal, later transient tachycardia/tachypnea, presumably due to pain, requiring IV Dilaudid for pain control.   MRI thoracic and cervical spine: Discitis/osteomyelitis at 2 to T3, extensive adjacent anterior and lateral paraspinal phlegmon extending to C6 and to T6, dorsal and ventral phlegmon T2-T4 ventrally and T1-T5 dorsally, probable septic arthritis of the right C2-C3 facet joint with adjacent facet osteomyelitis and right paraspinal soft tissue inflammation, right sided ventral and lateral phlegmon C2-C3.   MRSA on blood culture 3 of 4 bottles, Zosyn was d/c, vancomycin was continued. 05/26: WBC trending down to 15.4. ID advising neurosurgery consult to eval for washout/procedure to reduce bioburden. TEE may not be needed since will not change therapy of at least 6 weeks abx IV. Repeat BCx in 48 hours to eval for clearance. Neurosurgery reviewed and communicated w/ me via secure chat, no plan at this point for surgical intervention.  05/27: pt reporting pain, see nurse notes from overnight. Otherwise remains of IV abx, ID to peripherally follow through the weekend (today is Saturday, Monday is Memorial Day). UTox (+)opiates (from inpatient administration meds), cannabinoids, tricyclics.  05/28: pain better on scheduled Norco 10 q6h, repeat cultures pending 05/29: (+) repeat blood cultures, ID  following, changed abx to daptomycin. I have asked neurosurgery to reevaluate (Cameron Hammond) 05/30: No plans for NS or IR to go in and wash/debride. Contineu IV antibitoics per ID and repeat cultures per ID. May need long term IV Abx vs repeat imaging if persistent (+)Cx   5/31: [name].  Norco was added with Toradol.  ID is planning to repeat blood cultures tomorrow and if remain positive we will reimage.   6/1: Repeat blood cultures were obtained today.  MRI repeated yesterday which shows some progression of septic arthritis, discitis and osteomyelitis with some microabscesses. Per neurosurgery he still does not have any indication for surgical intervention and they were recommending continuing medical therapy.   6/2: Repeat blood cultures done yesterday remain negative in 24 hours.  Pain seems to be well controlled with Toradol and Norco.  No surgical intervention required at this time per neurosurgery.  Patient will remain in hospital for a long time to complete at least a 6 weeks course of IV antibiotics.   6/3: [place] 6/1 NGRD. No change in care plan.    6/5: Repeat blood cultures done on 10/17/2021 remain negative.  CRP improved to 3.  Clinically stable.    6/6: Blood cultures from 10/17/2021 remain negative for 5 days.  ESR improving and leukocytosis has been resolved.  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  PICC line ordered and ID is recommending outpatient IV daptomycin for 6 weeks with end date of 11/28/2021. TOC is trying to find a  TEFL teacher for home health. Patient also need assistance with medications.  Pharmacy is aware  6/7 c/o of pain. No sob or cp. Ambulating in hallway. Picc placed today 6/8 complaining of pain between shoulder blades. Would like his pain meds closer to eachother. I explainted to pt that plan  is to dc iv toradol as he has been on it for a period and also cannot be going home eventually with iv pain meds. Explained he will need to utilize oxycodone and tylenol more for now with taper to only tylenol.  6/9 patient complaining of neck pain however he feels the combination he is on currently is working for him.  I did explain to him about Toradol and ibuprofen effects on the kidneys and will need to monitor his renal function closely.  Also told patient I would like at some point to start decreasing his ibuprofen dosing.  He is in agreement with the plan.  Also discussed Eventually he will need to be tapered off of oxycodone . He is ambulating in the hallway today with no restrictioins.  6/10 pt complaining of his "work excuse note given" that had punctuation placed incorrectly. Quiet angry this am. Offered to redo his note and explained we use a template for this , and he declined. Charge nurse Cameron Hammond was present during this session  Week summary: Weaned off Toradol. Started on Ibuprofen. Need monitor renal function periodically. Had picc placed but we made decision to keep pt for iv abx as risk , since with h/xo IVDU.   Consultants:  ID.  Neurosurgery  Procedures:   Antimicrobials:  ceftaroline   Subjective: No shortness of breath or chest pain, pain controlled overall - Objective: Vitals:   10/28/21 2034 10/29/21 0335 10/29/21 0823 10/29/21 1146  BP: 120/83 96/64 108/66 117/72  Pulse: 70 63 65 81  Resp: _0 Temp:  97.6 F (36.4 C) 98.5 F (36.9 C) 98.4 F (36.9 C)  TempSrc:  Oral    SpO2: 100% 99% 98% 99%  Weight:      Height:        Intake/Output Summary (Last 24 hours) at 10/29/2021 1219 Last data filed at 10/28/2021 2034 Gross per 24 hour  Intake 462 ml  Output 0 ml  Net 462 ml   Filed Weights   10/10/21 1152 10/20/21 1202  Weight: 79.4 kg 74.3 kg    Examination: Calm, NAD Cta no w/r Reg s1/s2 no gallop Soft benign +bs No edema Aaoxox3   Mood and affect appropriate in current setting     Data Reviewed: I have personally reviewed following labs and imaging studies  CBC: No results for input(s): "WBC", "NEUTROABS", "HGB", "HCT", "MCV", "PLT" in the last 168 hours.  Basic Metabolic Panel: Recent Labs  Lab 10/24/21 1526 10/26/21 0530  NA 137  --   K 4.5  --   CL 103  --   CO2 26  --   GLUCOSE 110*  --   BUN 21*  --   CREATININE 1.13 0.89  CALCIUM 9.4  --    GFR: Estimated Creatinine Clearance: 98 mL/min (by C-G formula based on SCr of 0.89 mg/dL). Liver Function Tests: No results for input(s): "AST", "ALT", "ALKPHOS", "BILITOT", "PROT", "ALBUMIN" in the last 168 hours. No results for input(s): "LIPASE", "AMYLASE" in the last 168 hours. No results for input(s): "AMMONIA" in the last 168 hours. Coagulation Profile: No results for input(s): "INR", "PROTIME" in the last 168 hours. Cardiac Enzymes: Recent Labs  Lab 10/28/21 0501  CKTOTAL 42*   BNP (last 3 results) No results for input(s): "PROBNP" in the last 8760 hours. HbA1C: No results for input(s): "HGBA1C" in the last 72 hours. CBG: No results for input(s): "GLUCAP" in the last 168 hours.  Lipid Profile: No results for input(s): "CHOL", "HDL", "LDLCALC", "TRIG", "CHOLHDL", "LDLDIRECT" in the last 72 hours. Thyroid Function Tests: No results for input(s): "TSH", "T4TOTAL", "FREET4", "T3FREE", "THYROIDAB" in the last 72 hours. Anemia Panel: No results for input(s): "VITAMINB12", "FOLATE", "FERRITIN", "TIBC", "IRON", "RETICCTPCT" in the last 72 hours. Sepsis Labs: No results for input(s): "PROCALCITON", "LATICACIDVEN" in the last 168 hours.  No results found for this or any previous visit (from the past 240 hour(s)).        Radiology Studies: No results found.      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 Vancomycin --> Daptomycin on  10/15/21   not meeting sepsis criteria as of 10/11/21  Echocardiogram showed NO vegetations on TTE, should not need TEE since won't change IV abx course.  Neurosurgical input via secure chat 05/26: MRI no discrete abscess, and no neurologic deficits to warrant emergent intervention, will continue neurologic checks q shift and prn, neurosurgery advises no intervention / no surgical procedure at this time since unlikely to drain anything, if needed could consider IR to sample  Repeat (+)blood cultures 10/13/21, ID following, have asked neurosurgery to reevaluate (secure chat to [name] 05/29) Repeat MRI with some progression of infection but neurosurgery would like to continue with conservative management. Repeat blood cultures were done 6/1 remain negative in 5 days 6/7 PICC placed.  Continue daptomycin 800 mg IV daily with end date on 11/28/2021 6/8 dc toradol. Continue with oxyc. And tylenol for pain.  6/9 No Toradol. Added ibuprofen and vol. Gel. Refuses lidocaine patch. Need to monitor renal function closely on ibuprofen and as he received toradol for a long period of time 6/13 overall pain controlled. Ck creatinine in am since was on Toradol and now ibuprofen     Septic  arthritis of cervical spine (HCC) Continue iv abx as above Pain controlled patient will complete 6 weeks of IV daptomycin in the hospital on 11/28/2021 with history of IV drug abuse concern After getting 2 Anti MRSA abx x8 days, ID dc/'d ceftaroline  and now only on Daptomycin.      History of intravenous drug abuse ([place]) States last use was more than a year ago. Had a long discussion again to avoid missed use of PICC line, per patient he is done with drug abuse and staying sober since he moved out of [place]. 6/13 patient was  counseled on abstinence during his hospitalization        Hypokalemia Resolved   DVT prophylaxis: ambulating,lovenox Code Status:full Family Communication: none Disposition Plan:  Status is: Inpatient Remains inpatient appropriate because: iv tx. Pt with h/xo IVDU , cannot dc on picc.        LOS: 19 days   Time spent: 35 min    Nolberto Hanlon, MD [facility] Pager [phone]  If 7PM-7AM, please contact night-coverage 10/29/2021, 12:19 PM

## 2021-10-29 NOTE — Plan of Care (Signed)
  Problem: Education: Goal: Knowledge of General Education information will improve Description: Including pain rating scale, medication(s)/side effects and non-pharmacologic comfort measures Outcome: Progressing   Problem: Activity: Goal: Risk for activity intolerance will decrease Outcome: Progressing   Problem: Nutrition: Goal: Adequate nutrition will be maintained Outcome: Progressing   Problem: Coping: Goal: Level of anxiety will decrease Outcome: Progressing   Problem: Elimination: Goal: Will not experience complications related to bowel motility Outcome: Progressing   Problem: Elimination: Goal: Will not experience complications related to urinary retention Outcome: Progressing   Problem: Safety: Goal: Ability to remain free from injury will improve Outcome: Progressing   Problem: Skin Integrity: Goal: Risk for impaired skin integrity will decrease Outcome: Progressing   Problem: Education: Goal: Knowledge of General Education information will improve Description: Including pain rating scale, medication(s)/side effects and non-pharmacologic comfort measures Outcome: Progressing   Problem: Health Behavior/Discharge Planning: Goal: Ability to manage health-related needs will improve Outcome: Progressing   Problem: Clinical Measurements: Goal: Ability to maintain clinical measurements within normal limits will improve Outcome: Progressing   Problem: Clinical Measurements: Goal: Respiratory complications will improve Outcome: Progressing   Problem: Activity: Goal: Risk for activity intolerance will decrease Outcome: Progressing   Problem: Nutrition: Goal: Adequate nutrition will be maintained Outcome: Progressing   Problem: Coping: Goal: Level of anxiety will decrease Outcome: Progressing   Problem: Elimination: Goal: Will not experience complications related to bowel motility Outcome: Progressing

## 2021-10-30 LAB — CREATININE, SERUM
Creatinine, Ser: 0.63 mg/dL (ref 0.61–1.24)
GFR, Estimated: >60 mL/min (ref >60)

## 2021-10-30 MED ORDER — KETOROLAC TROMETHAMINE 30 MG/ML IJ SOLN
30.0000 mg | Freq: Two times a day (BID) | INTRAMUSCULAR | Status: DC | PRN
  Administered 2021-10-31 – 2021-11-02 (×5): 30 mg via INTRAVENOUS
  Filled 2021-10-30 (×6): qty 1

## 2021-10-30 MED ORDER — MORPHINE SULFATE ER 15 MG PO TBCR
15.0000 mg | EXTENDED_RELEASE_TABLET | Freq: Two times a day (BID) | ORAL | Status: DC
  Administered 2021-10-30 – 2021-10-31 (×3): 15 mg via ORAL
  Filled 2021-10-30 (×3): qty 1

## 2021-10-30 NOTE — Assessment & Plan Note (Signed)
States last use was more than a year ago. Had a long discussion again to avoid missed use of PICC line, per patient he is done with drug abuse and staying sober since he moved out of [place].  Counseled on abstinence 

## 2021-10-30 NOTE — Assessment & Plan Note (Signed)
Suspect hematogenous seeding from prior IVDU Cx (+)MRSA MRI 05/25 see results   Continue abx. Vancomycin --> Daptomycin on  10/15/21    not meeting sepsis criteria as of 10/11/21   Echocardiogram showed NO vegetations on TTE, should not need TEE since won't change IV abx course.   Neurosurgical input via secure chat 05/26: MRI no discrete abscess, and no neurologic deficits to warrant emergent intervention, will continue neurologic checks q shift and prn, neurosurgery advises no intervention / no surgical procedure at this time since unlikely to drain anything, if needed could consider IR to sample   Pain control with toradol and oxycodone   Repeat (+)blood cultures 10/13/21, ID following, have asked neurosurgery to reevaluate (secure chat to [name] 05/29)  Repeat MRI with some progression of infection but neurosurgery would like to continue with conservative management.  Repeat blood cultures were done 6/1 remain negative in 5 days  PICC line was placed on 6/7  -Continue daptomycin-Will need for 6 weeks, end date 11/28/2021  -Add MS Contin 15 mg twice daily to the pain regimen  -Readded Toradol twice daily as needed-we will try to avoid excessive use to prevent  adverse effects.  Continue with Norco and Flexeril

## 2021-10-30 NOTE — Assessment & Plan Note (Signed)
Resolved. -Continue to monitor and replete as needed 

## 2021-10-30 NOTE — Progress Notes (Signed)
Progress Note   Patient: Cameron Hammond [id number] DOB: [birth year] DOA: 10/10/2021     20 DOS: the patient was seen and examined on 10/30/2021   Brief hospital course: Taken from prior notes.  [name] is a 46 year old male PMH back pain, IVDU last use 6 months prior to admission (varying history on this).  Presented to ED 10/10/21 complaining of pain in head, neck, upper back x1 week, no relief with chiropractor. [name] 05/25: In ED CT head/cervical spine and CXR negative, CTA chest no PE, paraspinal soft tissue thickening extending from T2-T5 concerning for osteomyelitis.  WBC 17.3.  Started on vancomycin, Zosyn.  Vital signs initially normal, later transient tachycardia/tachypnea, presumably due to pain, requiring IV Dilaudid for pain control.   ? MRI thoracic and cervical spine: Discitis/osteomyelitis at 2 to T3, extensive adjacent anterior and lateral paraspinal phlegmon extending to C6 and to T6, dorsal and ventral phlegmon T2-T4 ventrally and T1-T5 dorsally, probable septic arthritis of the right C2-C3 facet joint with adjacent facet osteomyelitis and right paraspinal soft tissue inflammation, right sided ventral and lateral phlegmon C2-C3.   ? MRSA on blood culture 3 of 4 bottles, Zosyn was d/c, vancomycin was continued. [name] 05/26: WBC trending down to 15.4. ID advising neurosurgery consult to eval for washout/procedure to reduce bioburden. TEE may not be needed since will not change therapy of at least 6 weeks abx IV. Repeat BCx in 48 hours to eval for clearance. Neurosurgery reviewed and communicated w/ me via secure chat, no plan at this point for surgical intervention.  [name] 05/27: pt reporting pain, see nurse notes from overnight. Otherwise remains of IV abx, ID to peripherally follow through the weekend (today is Saturday, Monday is Memorial Day). UTox (+)opiates (from inpatient administration meds), cannabinoids, tricyclics.  [name] 05/28: pain better on scheduled Norco 10 q6h, repeat cultures  pending . 05/29: (+) repeat blood cultures, ID following, changed abx to daptomycin. I have asked neurosurgery to reevaluate ([name]) . 05/30: No plans for NS or IR to go in and wash/debride. Contineu IV antibitoics per ID and repeat cultures per ID. May need long term IV Abx vs repeat imaging if persistent (+)Cx  5/31: [name].  Norco was added with Toradol.  ID is planning to repeat blood cultures tomorrow and if remain positive we will reimage.  6/1: Repeat blood cultures were obtained today.  MRI repeated yesterday which shows some progression of septic arthritis, discitis and osteomyelitis with some microabscesses. Per neurosurgery he still does not have any indication for surgical intervention and they were recommending continuing medical therapy.  6/2: Repeat blood cultures done yesterday remain negative in 24 hours.  Pain seems to be well controlled with Toradol and Norco.  No surgical intervention required at this time per neurosurgery.  Patient will remain in hospital for a long time to complete at least a 6 weeks course of IV antibiotics.  6/3: [place] 6/1 NGRD. No change in care plan.   6/5: Repeat blood cultures done on 10/17/2021 remain negative.  CRP improved to 3.  Clinically stable.   6/6: Blood cultures from 10/17/2021 remain negative for 5 days.  ESR improving and leukocytosis has been resolved.  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  PICC line ordered and ID is recommending outpatient IV daptomycin for 6 weeks with end date  of 11/28/2021. TOC is trying to find a TEFL teacher for home health. Patient also need assistance with medications.  Pharmacy is aware  Week summary from the other provider. Patient continued to complain about pain.  He was weaned off from Toradol and started on ibuprofen.  PICC line was  placed.  Decision was made to keep him in the hospital to complete a 6-week course of IV daptomycin which will be completed on 11/28/2021.  6/14: Patient continued to experience significant upper back and chest pain which interferes with his sleep.  Stating that ibuprofen is not helping and requesting to restart Toradol.  Also requesting to give Flexeril with Norco as it helps most. Toradol twice daily as needed was restarted. 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 Vancomycin --> Daptomycin on  10/15/21    not meeting sepsis criteria as of 10/11/21   Echocardiogram showed NO vegetations on TTE, should not need TEE since won't change IV abx course.   Neurosurgical input via secure chat 05/26: MRI no discrete abscess, and no neurologic deficits to warrant emergent intervention, will continue neurologic checks q shift and prn, neurosurgery advises no intervention / no surgical procedure at this time since unlikely to drain anything, if needed could consider IR to sample   Pain control with toradol and oxycodone   Repeat (+)blood cultures 10/13/21, ID following, have asked neurosurgery to reevaluate (secure chat to [name] 05/29)  Repeat MRI with some progression of infection but neurosurgery would like to continue with conservative management.  Repeat blood cultures were done 6/1 remain negative in 5 days  PICC line was placed on 6/7  -Continue daptomycin-Will need for 6 weeks, end date 11/28/2021  -Add MS Contin 15 mg twice daily to the pain regimen  -Readded Toradol twice daily as needed-we will try to avoid excessive use to prevent  adverse effects.  Continue with Norco and Flexeril  Septic arthritis of cervical spine  ([place]) Management as above  History of intravenous drug abuse ([place]) States last use was more than a year ago. Had a long discussion again to avoid missed use of PICC line, per patient he is done with drug abuse and staying sober since he moved out of [place].  Counseled on abstinence  Hypokalemia Resolved. -Continue to monitor and replete as needed   Subjective: Was complaining of significant upper back and chest pain which increased with deep breathing, per patient he was unable to sleep for the past couple of night due to pain.  He was requesting to restart Toradol.  Physical Exam: Vitals:   10/29/21 2031 10/30/21 0315 10/30/21 0747 10/30/21 1534  BP: 126/76 116/77 109/74 115/74  Pulse: 81 77 68 84  Resp: 16 16 18 17   Temp: 98.3 F (36.8 C) 98.2 F (36.8 C) 98.2 F (36.8 C) 98.4 F (36.9 C)  TempSrc:    Oral  SpO2: 100% 98% 98% 100%  Weight:      Height:       General.     In no acute distress. Pulmonary.  Lungs clear bilaterally, normal respiratory effort. CV.  Regular rate and rhythm, no JVD, rub or murmur. Abdomen.  Soft, nontender, nondistended, BS positive. CNS.  Alert and oriented .  No focal neurologic deficit.  Extremities.  No edema, no cyanosis, pulses intact and symmetrical. Psychiatry.  Judgment and insight appears normal.  Data Reviewed: Prior notes, labs and images reviewed  Family Communication: Discussed with patient  Disposition: Status is: Inpatient Remains inpatient appropriate because: Severity of illness   Planned Discharge Destination: Home  DVT prophylaxis.  Lovenox Time spent: 40 minutes  This record has been created using Systems analyst. Errors have been sought and corrected,but may not always be located. Such creation errors do not reflect on the standard of care.  Author: Lorella Nimrod, MD 10/30/2021 4:54 PM  For on call review [email or web address].

## 2021-10-30 NOTE — Assessment & Plan Note (Signed)
Management as above °

## 2021-10-31 MED ORDER — MORPHINE SULFATE ER 15 MG PO TBCR
30.0000 mg | EXTENDED_RELEASE_TABLET | Freq: Two times a day (BID) | ORAL | Status: DC
  Administered 2021-10-31 – 2021-11-02 (×4): 30 mg via ORAL
  Filled 2021-10-31 (×4): qty 2

## 2021-10-31 NOTE — Progress Notes (Signed)
Progress Note   Patient: Cameron Hammond [id number]:[id number] DOB: [birth year] DOA: 10/10/2021     21 DOS: the patient was seen and examined on 10/31/2021   Brief hospital course: Taken from prior notes.  Cameron Hammond is a 46 year old male PMH back pain, IVDU last use 6 months prior to admission (varying history on this).  Presented to ED 10/10/21 complaining of pain in head, neck, upper back x1 week, no relief with chiropractor. [name] 05/25: In ED CT head/cervical spine and CXR negative, CTA chest no PE, paraspinal soft tissue thickening extending from T2-T5 concerning for osteomyelitis.  WBC 17.3.  Started on vancomycin, Zosyn.  Vital signs initially normal, later transient tachycardia/tachypnea, presumably due to pain, requiring IV Dilaudid for pain control.   ? MRI thoracic and cervical spine: Discitis/osteomyelitis at 2 to T3, extensive adjacent anterior and lateral paraspinal phlegmon extending to C6 and to T6, dorsal and ventral phlegmon T2-T4 ventrally and T1-T5 dorsally, probable septic arthritis of the right C2-C3 facet joint with adjacent facet osteomyelitis and right paraspinal soft tissue inflammation, right sided ventral and lateral phlegmon C2-C3.   ? MRSA on blood culture 3 of 4 bottles, Zosyn was d/c, vancomycin was continued. [name] 05/26: WBC trending down to 15.4. ID advising neurosurgery consult to eval for washout/procedure to reduce bioburden. TEE may not be needed since will not change therapy of at least 6 weeks abx IV. Repeat BCx in 48 hours to eval for clearance. Neurosurgery reviewed and communicated w/ me via secure chat, no plan at this point for surgical intervention.  [name] 05/27: pt reporting pain, see nurse notes from overnight. Otherwise remains of IV abx, ID to peripherally follow through the weekend (today is Saturday, Monday is Memorial Day). UTox (+)opiates (from inpatient administration meds), cannabinoids, tricyclics.  [name] 05/28: pain better on scheduled Norco 10 q6h, repeat cultures  pending . 05/29: (+) repeat blood cultures, ID following, changed abx to daptomycin. I have asked neurosurgery to reevaluate ([name]) . 05/30: No plans for NS or IR to go in and wash/debride. Contineu IV antibitoics per ID and repeat cultures per ID. May need long term IV Abx vs repeat imaging if persistent (+)Cx  5/31: [name].  Norco was added with Toradol.  ID is planning to repeat blood cultures tomorrow and if remain positive we will reimage.  6/1: Repeat blood cultures were obtained today.  MRI repeated yesterday which shows some progression of septic arthritis, discitis and osteomyelitis with some microabscesses. Per neurosurgery he still does not have any indication for surgical intervention and they were recommending continuing medical therapy.  6/2: Repeat blood cultures done yesterday remain negative in 24 hours.  Pain seems to be well controlled with Toradol and Norco.  No surgical intervention required at this time per neurosurgery.  Patient will remain in hospital for a long time to complete at least a 6 weeks course of IV antibiotics.  6/3: [place] 6/1 NGRD. No change in care plan.   6/5: Repeat blood cultures done on 10/17/2021 remain negative.  CRP improved to 3.  Clinically stable.   6/6: Blood cultures from 10/17/2021 remain negative for 5 days.  ESR improving and leukocytosis has been resolved.  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  PICC line ordered and ID is recommending outpatient IV daptomycin for 6 weeks with end date  of 11/28/2021. TOC is trying to find a TEFL teacher for home health. Patient also need assistance with medications.  Pharmacy is aware  Week summary from the other provider. Patient continued to complain about pain.  He was weaned off from Toradol and started on ibuprofen.  PICC line was  placed.  Decision was made to keep him in the hospital to complete a 6-week course of IV daptomycin which will be completed on 11/28/2021.  6/14: Patient continued to experience significant upper back and chest pain which interferes with his sleep.  Stating that ibuprofen is not helping and requesting to restart Toradol.  Also requesting to give Flexeril with Norco as it helps most. Toradol twice daily as needed was restarted. Also added MS Contin 15 mg twice daily Discussed with pharmacy to give Flexeril with Norco  6/15: Patient was able to sleep well after getting Toradol around midnight.  MS Contin which was started yesterday was not very helpful at this dose, dose was increased to 30 mg twice daily.   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 Vancomycin --> Daptomycin on  10/15/21    not meeting sepsis criteria as of 10/11/21   Echocardiogram showed NO vegetations on TTE, should not need TEE since won't change IV abx course.   Neurosurgical input via secure chat 05/26: MRI no discrete abscess, and no neurologic deficits to warrant emergent intervention, will continue neurologic checks q shift and prn, neurosurgery advises no intervention / no surgical procedure at this time since unlikely to drain anything, if needed could consider IR to sample   Pain control with toradol and oxycodone   Repeat (+)blood cultures 10/13/21, ID following, have asked neurosurgery to reevaluate (secure chat to [name] 05/29)  Repeat MRI with some progression of infection but neurosurgery would like to continue with conservative management.  Repeat blood cultures were done 6/1 remain negative in 5 days  PICC line was placed on 6/7  -Continue daptomycin-Will need for 6 weeks, end date 11/28/2021  -Increase the dose of MS Contin to 30 mg twice  daily  -Continue Toradol twice daily as needed-we will try to avoid excessive use to prevent  adverse effects.  Continue with Norco and Flexeril  Septic arthritis of cervical spine ([place]) Management as above  History of intravenous drug abuse ([place]) States last use was more than a year ago. Had a long discussion again to avoid missed use of PICC line, per patient he is done with drug abuse and staying sober since he moved out of [place].  Counseled on abstinence  Hypokalemia Resolved. -Continue to monitor and replete as needed   Subjective: Patient was able to get some sleep overnight after getting Toradol around midnight.  Stating that MS Contin is not working, we discussed about increasing the dose.  Physical Exam: Vitals:   10/30/21 2136 10/30/21 2342 10/31/21 0552 10/31/21 0813  BP: 121/81 124/84 101/65 113/82  Pulse:  88 68 72  Resp:  18 17 18   Temp:  97.6 F (36.4 C) 97.7 F (36.5 C) 98 F (36.7 C)  TempSrc:      SpO2:  98% 98% 100%  Weight:      Height:       General.  Well-developed gentleman, in no acute distress. Pulmonary.  Lungs clear bilaterally, normal respiratory effort. CV.  Regular rate and rhythm, no JVD, rub  or murmur. Abdomen.  Soft, nontender, nondistended, BS positive. CNS.  Alert and oriented .  No focal neurologic deficit. Extremities.  No edema, no cyanosis, pulses intact and symmetrical. Psychiatry.  Judgment and insight appears normal.  Data Reviewed: Prior notes and labs reviewed  Family Communication: Discussed with patient  Disposition: Status is: Inpatient Remains inpatient appropriate because: Severity of illness, requiring prolonged IV antibiotics.   Planned Discharge Destination: Home  DVT prophylaxis.  Lovenox Time spent: 40 minutes  This record has been created using Systems analyst. Errors have been sought and corrected,but may not always be located. Such creation errors do not reflect on the standard  of care.  Author: Lorella Nimrod, MD 10/31/2021 3:50 PM  For on call review [email or web address].

## 2021-10-31 NOTE — Assessment & Plan Note (Signed)
Suspect hematogenous seeding from prior IVDU Cx (+)MRSA MRI 05/25 see results   Continue abx. Vancomycin --> Daptomycin on  10/15/21    not meeting sepsis criteria as of 10/11/21   Echocardiogram showed NO vegetations on TTE, should not need TEE since won't change IV abx course.   Neurosurgical input via secure chat 05/26: MRI no discrete abscess, and no neurologic deficits to warrant emergent intervention, will continue neurologic checks q shift and prn, neurosurgery advises no intervention / no surgical procedure at this time since unlikely to drain anything, if needed could consider IR to sample   Pain control with toradol and oxycodone   Repeat (+)blood cultures 10/13/21, ID following, have asked neurosurgery to reevaluate (secure chat to [name] 05/29)  Repeat MRI with some progression of infection but neurosurgery would like to continue with conservative management.  Repeat blood cultures were done 6/1 remain negative in 5 days  PICC line was placed on 6/7  -Continue daptomycin-Will need for 6 weeks, end date 11/28/2021  -Increase the dose of MS Contin to 30 mg twice daily  -Continue Toradol twice daily as needed-we will try to avoid excessive use to prevent  adverse effects.  Continue with Norco and Flexeril

## 2021-11-01 NOTE — Progress Notes (Signed)
Progress Note   Patient: Yaroslav Gombos MWU:[id number] DOB: 1976/03/16 DOA: 10/10/2021     22 DOS: the patient was seen and examined on 11/01/2021   Brief hospital course: Taken from prior notes.  Mr. Timson is a 46 year old male PMH back pain, IVDU last use 6 months prior to admission (varying history on this).  Presented to ED 10/10/21 complaining of pain in head, neck, upper back x1 week, no relief with chiropractor. [name] 05/25: In ED CT head/cervical spine and CXR negative, CTA chest no PE, paraspinal soft tissue thickening extending from T2-T5 concerning for osteomyelitis.  WBC 17.3.  Started on vancomycin, Zosyn.  Vital signs initially normal, later transient tachycardia/tachypnea, presumably due to pain, requiring IV Dilaudid for pain control.   ? MRI thoracic and cervical spine: Discitis/osteomyelitis at 2 to T3, extensive adjacent anterior and lateral paraspinal phlegmon extending to C6 and to T6, dorsal and ventral phlegmon T2-T4 ventrally and T1-T5 dorsally, probable septic arthritis of the right C2-C3 facet joint with adjacent facet osteomyelitis and right paraspinal soft tissue inflammation, right sided ventral and lateral phlegmon C2-C3.   ? MRSA on blood culture 3 of 4 bottles, Zosyn was d/c, vancomycin was continued. [name] 05/26: WBC trending down to 15.4. ID advising neurosurgery consult to eval for washout/procedure to reduce bioburden. TEE may not be needed since will not change therapy of at least 6 weeks abx IV. Repeat BCx in 48 hours to eval for clearance. Neurosurgery reviewed and communicated w/ me via secure chat, no plan at this point for surgical intervention.  [name] 05/27: pt reporting pain, see nurse notes from overnight. Otherwise remains of IV abx, ID to peripherally follow through the weekend (today is Saturday, Monday is Memorial Day). UTox (+)opiates (from inpatient administration meds), cannabinoids, tricyclics.  [name] 05/28: pain better on scheduled Norco 10 q6h, repeat cultures  pending . 05/29: (+) repeat blood cultures, ID following, changed abx to daptomycin. I have asked neurosurgery to reevaluate ([name]) . 05/30: No plans for NS or IR to go in and wash/debride. Contineu IV antibitoics per ID and repeat cultures per ID. May need long term IV Abx vs repeat imaging if persistent (+)Cx  5/31: [name].  Norco was added with Toradol.  ID is planning to repeat blood cultures tomorrow and if remain positive we will reimage.  6/1: Repeat blood cultures were obtained today.  MRI repeated yesterday which shows some progression of septic arthritis, discitis and osteomyelitis with some microabscesses. Per neurosurgery he still does not have any indication for surgical intervention and they were recommending continuing medical therapy.  6/2: Repeat blood cultures done yesterday remain negative in 24 hours.  Pain seems to be well controlled with Toradol and Norco.  No surgical intervention required at this time per neurosurgery.  Patient will remain in hospital for a long time to complete at least a 6 weeks course of IV antibiotics.  6/3: [place] 6/1 NGRD. No change in care plan.   6/5: Repeat blood cultures done on 10/17/2021 remain negative.  CRP improved to 3.  Clinically stable.   6/6: Blood cultures from 10/17/2021 remain negative for 5 days.  ESR improving and leukocytosis has been resolved.  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  PICC line ordered and ID is recommending outpatient IV daptomycin for 6 weeks with end date  of 11/28/2021. TOC is trying to find a TEFL teacher for home health. Patient also need assistance with medications.  Pharmacy is aware  Week summary from the other provider. Patient continued to complain about pain.  He was weaned off from Toradol and started on ibuprofen.  PICC line was  placed.  Decision was made to keep him in the hospital to complete a 6-week course of IV daptomycin which will be completed on 11/28/2021.  6/14: Patient continued to experience significant upper back and chest pain which interferes with his sleep.  Stating that ibuprofen is not helping and requesting to restart Toradol.  Also requesting to give Flexeril with Norco as it helps most. Toradol twice daily as needed was restarted. Also added MS Contin 15 mg twice daily Discussed with pharmacy to give Flexeril with Norco  6/15: Patient was able to sleep well after getting Toradol around midnight.  MS Contin which was started yesterday was not very helpful at this dose, dose was increased to 30 mg twice daily.  6/16: Remains stable.  We will continue with current pain regimen and daptomycin.   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 Vancomycin --> Daptomycin on  10/15/21    not meeting sepsis criteria as of 10/11/21   Echocardiogram showed NO vegetations on TTE, should not need TEE since won't change IV abx course.   Neurosurgical input via secure chat 05/26: MRI no discrete abscess, and no neurologic deficits to warrant emergent intervention, will continue neurologic checks q shift and prn, neurosurgery advises no intervention / no surgical procedure at this time since unlikely to drain anything, if needed could consider IR to sample   Pain control with toradol and oxycodone   Repeat (+)blood cultures 10/13/21, ID following, have asked neurosurgery to reevaluate (secure chat to [name] 05/29)  Repeat MRI with some progression of infection but neurosurgery would like to continue with conservative management.  Repeat blood cultures were done 6/1 remain negative in 5 days  PICC line was placed on 6/7  -Continue daptomycin-Will  need for 6 weeks, end date 11/28/2021  -Increase the dose of MS Contin to 30 mg twice daily  -Continue Toradol twice daily as needed-we will try to avoid excessive use to prevent  adverse effects.  Continue with Norco and Flexeril  Septic arthritis of cervical spine ([place]) Management as above  History of intravenous drug abuse ([place]) States last use was more than a year ago. Had a long discussion again to avoid missed use of PICC line, per patient he is done with drug abuse and staying sober since he moved out of [place].  Counseled on abstinence  Hypokalemia Resolved. -Continue to monitor and replete as needed   Subjective: Patient has have some improvement in pain with current regimen and increased dose of MS Contin.  Physical Exam: Vitals:   10/31/21 1943 11/01/21 0256 11/01/21 0727 11/01/21 1530  BP:  120/80 108/84 115/74  Pulse: 87 86 74 84  Resp: _0 Temp: 98.2 F (36.8 C) 97.8 F (36.6 C) 98.3 F (36.8 C) 98.3 F (36.8 C)  TempSrc: Oral Oral    SpO2: 99% 100% 99% 100%  Weight:      Height:       General.     In no acute distress. Pulmonary.  Lungs clear bilaterally, normal respiratory effort. CV.  Regular rate and rhythm, no JVD, rub or murmur. Abdomen.  Soft, nontender, nondistended, BS positive. CNS.  Alert and oriented .  No focal neurologic deficit. Extremities.  No edema, no cyanosis, pulses intact and symmetrical. Psychiatry.  Judgment and insight appears normal.  Data Reviewed: Prior notes and labs reviewed  Family Communication:   Disposition: Status is: Inpatient Remains inpatient appropriate because: Prolonged IV antibiotics needed.  History of IV drug use   Planned Discharge Destination: Home  DVT prophylaxis.  Lovenox Time spent: 40 minutes  This record has been created using Systems analyst. Errors have been sought and corrected,but may not always be located. Such creation errors do not reflect on the  standard of care.  Author: Lorella Nimrod, MD 11/01/2021 4:38 PM  For on call review [email or web address].

## 2021-11-02 MED ORDER — MELOXICAM 7.5 MG PO TABS
15.0000 mg | ORAL_TABLET | Freq: Every day | ORAL | Status: DC
  Administered 2021-11-02 – 2021-11-18 (×17): 15 mg via ORAL
  Filled 2021-11-02 (×17): qty 2

## 2021-11-02 MED ORDER — MORPHINE SULFATE ER 15 MG PO TBCR
45.0000 mg | EXTENDED_RELEASE_TABLET | Freq: Two times a day (BID) | ORAL | Status: DC
  Administered 2021-11-02 – 2021-11-08 (×12): 45 mg via ORAL
  Filled 2021-11-02 (×12): qty 3

## 2021-11-02 MED ORDER — KETOROLAC TROMETHAMINE 30 MG/ML IJ SOLN
30.0000 mg | Freq: Three times a day (TID) | INTRAMUSCULAR | Status: AC | PRN
  Administered 2021-11-02 – 2021-11-06 (×12): 30 mg via INTRAVENOUS
  Filled 2021-11-02 (×12): qty 1

## 2021-11-02 NOTE — Assessment & Plan Note (Signed)
Suspect hematogenous seeding from prior IVDU Cx (+)MRSA MRI 05/25 see results   Continue abx. Vancomycin --> Daptomycin on  10/15/21    not meeting sepsis criteria as of 10/11/21   Echocardiogram showed NO vegetations on TTE, should not need TEE since won't change IV abx course.        Pain control with toradol and oxycodone, MS Contin and newly added Mobic  Repeat (+)blood cultures 10/13/21, ID following, have asked neurosurgery to reevaluate (secure chat to [name] 05/29)  Repeat MRI with some progression of infection but neurosurgery would like to continue with conservative management.  Repeat blood cultures were done 6/1 remain negative in 5 days  PICC line was placed on 6/7  -Continue daptomycin-Will need for 6 weeks, end date 11/28/2021  -Increase the dose of MS Contin to 45 mg twice daily  Add Mobic  Continue with as needed Toradol for 3-4 more days  -Continue with Norco and Flexeril

## 2021-11-02 NOTE — Progress Notes (Signed)
Progress Note   Cameron Hammond: Cameron Hammond CZY:[id number] DOB: 1975-06-12 DOA: 10/10/2021     23 DOS: the Cameron Hammond was seen and examined on 11/02/2021   Brief hospital course: Taken from prior notes.  Cameron Hammond is a 46 year old male PMH back pain, IVDU last use 6 months prior to admission (varying history on this).  Presented to ED 10/10/21 complaining of pain in head, neck, upper back x1 week, no relief with chiropractor. [name] 05/25: In ED CT head/cervical spine and CXR negative, CTA chest no PE, paraspinal soft tissue thickening extending from T2-T5 concerning for osteomyelitis.  WBC 17.3.  Started on vancomycin, Zosyn.  Vital signs initially normal, later transient tachycardia/tachypnea, presumably due to pain, requiring IV Dilaudid for pain control.   ? MRI thoracic and cervical spine: Discitis/osteomyelitis at 2 to T3, extensive adjacent anterior and lateral paraspinal phlegmon extending to C6 and to T6, dorsal and ventral phlegmon T2-T4 ventrally and T1-T5 dorsally, probable septic arthritis of the right C2-C3 facet joint with adjacent facet osteomyelitis and right paraspinal soft tissue inflammation, right sided ventral and lateral phlegmon C2-C3.   ? MRSA on blood culture 3 of 4 bottles, Zosyn was d/c, vancomycin was continued. [name] 05/26: WBC trending down to 15.4. ID advising neurosurgery consult to eval for washout/procedure to reduce bioburden. TEE may not be needed since will not change therapy of at least 6 weeks abx IV. Repeat BCx in 48 hours to eval for clearance. Neurosurgery reviewed and communicated w/ me via secure chat, no plan at this point for surgical intervention.  [name] 05/27: pt reporting pain, see nurse notes from overnight. Otherwise remains of IV abx, ID to peripherally follow through the weekend ([place] is Saturday, Monday is Memorial Day). UTox (+)opiates (from inpatient administration meds), cannabinoids, tricyclics.  [name] 05/28: pain better on scheduled Norco 10 q6h, repeat cultures  pending . 05/29: (+) repeat blood cultures, ID following, changed abx to daptomycin. I have asked neurosurgery to reevaluate ([name]) . 05/30: No plans for NS or IR to go in and wash/debride. Contineu IV antibitoics per ID and repeat cultures per ID. May need long term IV Abx vs repeat imaging if persistent (+)Cx  5/31: Cameron Hammond continued to have significant lower neck and upper back pain.  Norco was added with Toradol.  ID is planning to repeat blood cultures tomorrow and if remain positive we will reimage.  6/1: Repeat blood cultures were obtained Hammond.  MRI repeated yesterday which shows some progression of septic arthritis, discitis and osteomyelitis with some microabscesses. Per neurosurgery Cameron Hammond still does not have any indication for surgical intervention and they were recommending continuing medical therapy.  6/2: Repeat blood cultures done yesterday remain negative in 24 hours.  Pain seems to be well controlled with Toradol and Norco.  No surgical intervention required at this time per neurosurgery.  Cameron Hammond will remain in hospital for a long time to complete at least a 6 weeks course of IV antibiotics.  6/3: [place] 6/1 NGRD. No change in care plan.   6/5: Repeat blood cultures done on 10/17/2021 remain negative.  CRP improved to 3.  Clinically stable.   6/6: Blood cultures from 10/17/2021 remain negative for 5 days.  ESR improving and leukocytosis has been resolved.  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  PICC line ordered and ID is recommending outpatient IV daptomycin for 6 weeks with end date  of 11/28/2021. TOC is trying to find a TEFL teacher for home health. Cameron Hammond also need assistance with medications.  Pharmacy is aware  Week summary from the other provider. Cameron Hammond continued to complain about pain.  Cameron Hammond was weaned off from Toradol and started on ibuprofen.  PICC line was  placed.  Decision was made to keep him in the hospital to complete a 6-week course of IV daptomycin which will be completed on 11/28/2021.  6/14: Cameron Hammond continued to experience significant upper back and chest pain which interferes with his sleep.  Stating that ibuprofen is not helping and requesting to restart Toradol.  Also requesting to give Flexeril with Norco as it helps most. Toradol twice daily as needed was restarted. Also added MS Contin 15 mg twice daily Discussed with pharmacy to give Flexeril with Norco  6/15: Cameron Hammond was able to sleep well after getting Toradol around midnight.  MS Contin which was started yesterday was not very helpful at this dose, dose was increased to 30 mg twice daily.  6/16: Remains stable.  We will continue with current pain regimen and daptomycin.  6/17: Pain remained uncontrolled again.  Unable to sleep last night.  MS Contin dose increased to 45 mg twice daily, Toradol made every 8 hourly as needed.  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 Vancomycin --> Daptomycin on  10/15/21    not meeting sepsis criteria as of 10/11/21   Echocardiogram showed NO vegetations on TTE, should not need TEE since won't change IV abx course.        Pain control with toradol and oxycodone, MS Contin and newly added Mobic  Repeat (+)blood cultures 10/13/21, ID following, have asked neurosurgery to reevaluate (secure chat to [name] 05/29)  Repeat MRI with some progression of infection but neurosurgery would like to continue with conservative management.  Repeat blood cultures were done 6/1 remain negative in 5 days  PICC line was placed on 6/7  -Continue daptomycin-Will need for 6 weeks, end date 11/28/2021  -Increase the dose of MS Contin to 45 mg twice daily  Add Mobic  Continue  with as needed Toradol for 3-4 more days  -Continue with Norco and Flexeril  Septic arthritis of cervical spine ([place]) Management as above  History of intravenous drug abuse ([place]) States last use was more than a year ago. Had a long discussion again to avoid missed use of PICC line, per Cameron Hammond Cameron Hammond is done with drug abuse and staying sober since Cameron Hammond moved out of [place].  Counseled on abstinence  Hypokalemia Resolved. -Continue to monitor and replete as needed   Subjective: Cameron Hammond.  Continues to have significant pain, stating that Cameron Hammond could not sleep last night until Cameron Hammond got his IV Toradol around 3 AM.  Physical Exam: Vitals:   11/01/21 1530 11/01/21 2046 11/02/21 0445 11/02/21 0929  BP: 115/74 115/73 109/67 123/83  Pulse: 84 81 75 78  Resp: _0 Temp: 98.3 F (36.8 C) 97.7 F (36.5 C) 98 F (36.7 C) 97.8 F (36.6 C)  TempSrc:      SpO2: 100% 98% 98% 98%  Weight:      Height:       General.  Well-developed gentleman, in no acute distress. Pulmonary.  Lungs clear bilaterally, normal respiratory effort. CV.  Regular  rate and rhythm, no JVD, rub or murmur. Abdomen.  Soft, nontender, nondistended, BS positive. CNS.  Alert and oriented .  No focal neurologic deficit. Extremities.  No edema, no cyanosis, pulses intact and symmetrical. Psychiatry.  Judgment and insight appears normal.  Data Reviewed: Prior notes and labs reviewed  Family Communication: Discussed with Cameron Hammond  Disposition: Status is: Inpatient Remains inpatient appropriate because: Prolonged hospitalization until 11/28/2021 for IV antibiotics with history of IV drug use.   Planned Discharge Destination: Home  DVT prophylaxis.  Lovenox Time spent: 45 minutes  This record has been created using Systems analyst. Errors have been sought and corrected,but may not always be located. Such creation errors do not reflect on the standard of  care.  Author: Lorella Nimrod, MD 11/02/2021 2:41 PM  For on call review [email or web address].

## 2021-11-03 NOTE — Assessment & Plan Note (Signed)
Suspect hematogenous seeding from prior IVDU Cx (+)MRSA MRI 05/25 see results   Continue abx. Vancomycin --> Daptomycin on  10/15/21    not meeting sepsis criteria as of 10/11/21   Echocardiogram showed NO vegetations on TTE, should not need TEE since won't change IV abx course.        Pain control with toradol and oxycodone, MS Contin and newly added Mobic  Repeat (+)blood cultures 10/13/21, ID following, have asked neurosurgery to reevaluate (secure chat to [name] 05/29)  Repeat MRI with some progression of infection but neurosurgery would like to continue with conservative management.  Repeat blood cultures were done 6/1 remain negative in 5 days  PICC line was placed on 6/7  -Continue daptomycin-Will need for 6 weeks, end date 11/28/2021  -Continue with MS Contin at 45 mg twice daily  Continue Mobic  Continue with as needed Toradol for 3 more days  -Continue with Norco and Flexeril

## 2021-11-03 NOTE — Progress Notes (Signed)
Progress Note   Patient: Cameron Hammond [id number]:[id number] DOB: 1975/07/27 DOA: 10/10/2021     24 DOS: the patient was seen and examined on 11/03/2021   Brief hospital course: Taken from prior notes.  [name] is a 46 year old male PMH back pain, IVDU last use 6 months prior to admission (varying history on this).  Presented to ED 10/10/21 complaining of pain in head, neck, upper back x1 week, no relief with chiropractor. [name] 05/25: In ED CT head/cervical spine and CXR negative, CTA chest no PE, paraspinal soft tissue thickening extending from T2-T5 concerning for osteomyelitis.  WBC 17.3.  Started on vancomycin, Zosyn.  Vital signs initially normal, later transient tachycardia/tachypnea, presumably due to pain, requiring IV Dilaudid for pain control.   ? MRI thoracic and cervical spine: Discitis/osteomyelitis at 2 to T3, extensive adjacent anterior and lateral paraspinal phlegmon extending to C6 and to T6, dorsal and ventral phlegmon T2-T4 ventrally and T1-T5 dorsally, probable septic arthritis of the right C2-C3 facet joint with adjacent facet osteomyelitis and right paraspinal soft tissue inflammation, right sided ventral and lateral phlegmon C2-C3.   ? MRSA on blood culture 3 of 4 bottles, Zosyn was d/c, vancomycin was continued. [name] 05/26: WBC trending down to 15.4. ID advising neurosurgery consult to eval for washout/procedure to reduce bioburden. TEE may not be needed since will not change therapy of at least 6 weeks abx IV. Repeat BCx in 48 hours to eval for clearance. Neurosurgery reviewed and communicated w/ me via secure chat, no plan at this point for surgical intervention.  [name] 05/27: pt reporting pain, see nurse notes from overnight. Otherwise remains of IV abx, ID to peripherally follow through the weekend (today is Saturday, Monday is Memorial Day). UTox (+)opiates (from inpatient administration meds), cannabinoids, tricyclics.  [name] 05/28: pain better on scheduled Norco 10 q6h, repeat cultures  pending . 05/29: (+) repeat blood cultures, ID following, changed abx to daptomycin. I have asked neurosurgery to reevaluate ([name]) . 05/30: No plans for NS or IR to go in and wash/debride. Contineu IV antibitoics per ID and repeat cultures per ID. May need long term IV Abx vs repeat imaging if persistent (+)Cx  5/31: [name].  Norco was added with Toradol.  ID is planning to repeat blood cultures tomorrow and if remain positive we will reimage.  6/1: Repeat blood cultures were obtained today.  MRI repeated yesterday which shows some progression of septic arthritis, discitis and osteomyelitis with some microabscesses. Per neurosurgery he still does not have any indication for surgical intervention and they were recommending continuing medical therapy.  6/2: Repeat blood cultures done yesterday remain negative in 24 hours.  Pain seems to be well controlled with Toradol and Norco.  No surgical intervention required at this time per neurosurgery.  Patient will remain in hospital for a long time to complete at least a 6 weeks course of IV antibiotics.  6/3: [name] 6/1 NGRD. No change in care plan.   6/5: Repeat blood cultures done on 10/17/2021 remain negative.  CRP improved to 3.  Clinically stable.   6/6: Blood cultures from 10/17/2021 remain negative for 5 days.  ESR improving and leukocytosis has been resolved.  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  PICC line ordered and ID is recommending outpatient IV daptomycin for 6 weeks with end date  of 11/28/2021. TOC is trying to find a TEFL teacher for home health. Patient also need assistance with medications.  Pharmacy is aware  Week summary from the other provider. Patient continued to complain about pain.  He was weaned off from Toradol and started on ibuprofen.  PICC line was  placed.  Decision was made to keep him in the hospital to complete a 6-week course of IV daptomycin which will be completed on 11/28/2021.  6/14: Patient continued to experience significant upper back and chest pain which interferes with his sleep.  Stating that ibuprofen is not helping and requesting to restart Toradol.  Also requesting to give Flexeril with Norco as it helps most. Toradol twice daily as needed was restarted. Also added MS Contin 15 mg twice daily Discussed with pharmacy to give Flexeril with Norco  6/15: Patient was able to sleep well after getting Toradol around midnight.  MS Contin which was started yesterday was not very helpful at this dose, dose was increased to 30 mg twice daily.  6/16: Remains stable.  We will continue with current pain regimen and daptomycin.  6/17: Pain remained uncontrolled again.  Unable to sleep last night.  MS Contin dose increased to 45 mg twice daily, Toradol made every 8 hourly as needed.  Also added Mobic.  6/18: Repeat labs were ordered for tomorrow.  Pain seems little improved today, able to get some sleep last night.  We will continue with current regimen. Patient is moving around without any difficulty, we will discontinue DVT prophylaxis today.   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 Vancomycin --> Daptomycin on  10/15/21    not meeting sepsis criteria as of 10/11/21   Echocardiogram showed NO vegetations on TTE, should not need TEE since won't change IV abx course.        Pain control with toradol and oxycodone, MS Contin and newly added Mobic  Repeat (+)blood cultures 10/13/21, ID following, have asked neurosurgery to reevaluate (secure chat to [name] 05/29)  Repeat MRI with some progression of infection but neurosurgery would like to continue with conservative  management.  Repeat blood cultures were done 6/1 remain negative in 5 days  PICC line was placed on 6/7  -Continue daptomycin-Will need for 6 weeks, end date 11/28/2021  -Continue with MS Contin at 45 mg twice daily  Continue Mobic  Continue with as needed Toradol for 3 more days  -Continue with Norco and Flexeril  Septic arthritis of cervical spine ([place]) Management as above  History of intravenous drug abuse ([place]) States last use was more than a year ago. Had a long discussion again to avoid missed use of PICC line, per patient he is done with drug abuse and staying sober since he moved out of [place].  Counseled on abstinence  Hypokalemia Resolved. -Continue to monitor and replete as needed  Subjective: Patient was seen and examined today.  Pain seems little improved today.  Able to get some night sleep.  Physical Exam: Vitals:   11/02/21 1700 11/02/21 2009 11/03/21 0325 11/03/21 1147  BP: 116/73 121/75 114/85 123/68  Pulse: 82 84 78 83  Resp: 16 18 16 16   Temp:  98.8 F (37.1 C) 98 F (36.7 C) 97.7 F (36.5 C)  TempSrc:    Oral  SpO2: 99% 99% 100% 100%  Weight:      Height:  General.  Well-developed gentleman, in no acute distress. Pulmonary.  Lungs clear bilaterally, normal respiratory effort. CV.  Regular rate and rhythm, no JVD, rub or murmur. Abdomen.  Soft, nontender, nondistended, BS positive. CNS.  Alert and oriented .  No focal neurologic deficit. Extremities.  No edema, no cyanosis, pulses intact and symmetrical. Psychiatry.  Judgment and insight appears normal.  Data Reviewed: Prior data reviewed  Family Communication: Discussed with patient  Disposition: Status is: Inpatient Remains inpatient appropriate because: Need prolonged IV antibiotics with history of IV drug use   Planned Discharge Destination: Home  DVT prophylaxis was discontinued today as he is moving very freely. Time spent: 42 minutes  This record has been created  using Systems analyst. Errors have been sought and corrected,but may not always be located. Such creation errors do not reflect on the standard of care.  Author: Lorella Nimrod, MD 11/03/2021 1:26 PM  For on call review [email or web address].

## 2021-11-04 LAB — COMPREHENSIVE METABOLIC PANEL
ALT: 40 U/L (ref 0–44)
AST: 23 U/L (ref 15–41)
Albumin: 3.9 g/dL (ref 3.5–5.0)
Alkaline Phosphatase: 116 U/L (ref 38–126)
Anion gap: 8 (ref 5–15)
BUN: 19 mg/dL (ref 6–20)
CO2: 31 mmol/L (ref 22–32)
Calcium: 9.8 mg/dL (ref 8.9–10.3)
Chloride: 100 mmol/L (ref 98–111)
Creatinine, Ser: 0.79 mg/dL (ref 0.61–1.24)
GFR, Estimated: >60 mL/min (ref >60)
Glucose, Bld: 95 mg/dL (ref 70–99)
Potassium: 4.9 mmol/L (ref 3.5–5.1)
Sodium: 139 mmol/L (ref 135–145)
Total Bilirubin: 0.5 mg/dL (ref 0.3–1.2)
Total Protein: 7.6 g/dL (ref 6.5–8.1)

## 2021-11-04 LAB — CK: Total CK: 80 U/L (ref 49–397)

## 2021-11-04 LAB — CBC
HCT: 36.3 % — ABNORMAL LOW (ref 39.0–52.0)
Hemoglobin: 11.9 g/dL — ABNORMAL LOW (ref 13.0–17.0)
MCH: 29 pg (ref 26.0–34.0)
MCHC: 32.8 g/dL (ref 30.0–36.0)
MCV: 88.3 fL (ref 80.0–100.0)
Platelets: 252 10*3/uL (ref 150–400)
RBC: 4.11 MIL/uL — ABNORMAL LOW (ref 4.22–5.81)
RDW: 12.7 % (ref 11.5–15.5)
WBC: 6.2 10*3/uL (ref 4.0–10.5)
nRBC: 0 % (ref 0.0–0.2)

## 2021-11-04 LAB — SEDIMENTATION RATE: Sed Rate: 56 mm/hr — ABNORMAL HIGH (ref 0–15)

## 2021-11-04 LAB — C-REACTIVE PROTEIN: CRP: 3.8 mg/dL — ABNORMAL HIGH (ref <1.0)

## 2021-11-04 NOTE — Plan of Care (Signed)

## 2021-11-04 NOTE — Plan of Care (Signed)

## 2021-11-04 NOTE — Assessment & Plan Note (Signed)
Suspect hematogenous seeding from prior IVDU Cx (+)MRSA MRI 05/25 see results   Continue abx. Vancomycin --> Daptomycin on  10/15/21    not meeting sepsis criteria as of 10/11/21   Echocardiogram showed NO vegetations on TTE, should not need TEE since won't change IV abx course.        Pain control with toradol and oxycodone, MS Contin and newly added Mobic  Repeat (+)blood cultures 10/13/21, ID following, have asked neurosurgery to reevaluate (secure chat to [name] 05/29)  Repeat MRI with some progression of infection but neurosurgery would like to continue with conservative management.  Repeat blood cultures were done 6/1 remain negative in 5 days  PICC line was placed on 6/7  -Continue daptomycin-Will need for 6 weeks, end date 11/28/2021  -Continue with MS Contin at 45 mg twice daily  Continue Mobic  Continue with as needed Toradol for 2-3 more days  -Continue with Norco and Flexeril

## 2021-11-04 NOTE — Progress Notes (Signed)
Progress Note   Patient: Cameron Hammond NLZ:[id number] DOB: Jul 06, 1975 DOA: 10/10/2021     25 DOS: the patient was seen and examined on 11/04/2021   Brief hospital course: Taken from prior notes.  Cameron Hammond is a 46 year old male PMH back pain, IVDU last use 6 months prior to admission (varying history on this).  Presented to ED 10/10/21 complaining of pain in head, neck, upper back x1 week, no relief with chiropractor. [name] 05/25: In ED CT head/cervical spine and CXR negative, CTA chest no PE, paraspinal soft tissue thickening extending from T2-T5 concerning for osteomyelitis.  WBC 17.3.  Started on vancomycin, Zosyn.  Vital signs initially normal, later transient tachycardia/tachypnea, presumably due to pain, requiring IV Dilaudid for pain control.   ? MRI thoracic and cervical spine: Discitis/osteomyelitis at 2 to T3, extensive adjacent anterior and lateral paraspinal phlegmon extending to C6 and to T6, dorsal and ventral phlegmon T2-T4 ventrally and T1-T5 dorsally, probable septic arthritis of the right C2-C3 facet joint with adjacent facet osteomyelitis and right paraspinal soft tissue inflammation, right sided ventral and lateral phlegmon C2-C3.   ? MRSA on blood culture 3 of 4 bottles, Zosyn was d/c, vancomycin was continued. [name] 05/26: WBC trending down to 15.4. ID advising neurosurgery consult to eval for washout/procedure to reduce bioburden. TEE may not be needed since will not change therapy of at least 6 weeks abx IV. Repeat BCx in 48 hours to eval for clearance. Neurosurgery reviewed and communicated w/ me via secure chat, no plan at this point for surgical intervention.  [name] 05/27: pt reporting pain, see nurse notes from overnight. Otherwise remains of IV abx, ID to peripherally follow through the weekend (today is Saturday, Monday is Memorial Day). UTox (+)opiates (from inpatient administration meds), cannabinoids, tricyclics.  [name] 05/28: pain better on scheduled Norco 10 q6h, repeat cultures  pending . 05/29: (+) repeat blood cultures, ID following, changed abx to daptomycin. I have asked neurosurgery to reevaluate (Cameron Hammond) . 05/30: No plans for NS or IR to go in and wash/debride. Contineu IV antibitoics per ID and repeat cultures per ID. May need long term IV Abx vs repeat imaging if persistent (+)Cx  5/31: [name].  Norco was added with Toradol.  ID is planning to repeat blood cultures tomorrow and if remain positive we will reimage.  6/1: Repeat blood cultures were obtained today.  MRI repeated yesterday which shows some progression of septic arthritis, discitis and osteomyelitis with some microabscesses. Per neurosurgery he still does not have any indication for surgical intervention and they were recommending continuing medical therapy.  6/2: Repeat blood cultures done yesterday remain negative in 24 hours.  Pain seems to be well controlled with Toradol and Norco.  No surgical intervention required at this time per neurosurgery.  Patient will remain in hospital for a long time to complete at least a 6 weeks course of IV antibiotics.  6/3: [place] 6/1 NGRD. No change in care plan.   6/5: Repeat blood cultures done on 10/17/2021 remain negative.  CRP improved to 3.  Clinically stable.   6/6: Blood cultures from 10/17/2021 remain negative for 5 days.  ESR improving and leukocytosis has been resolved.  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  PICC line ordered and ID is recommending outpatient IV daptomycin for 6 weeks with end date  of 11/28/2021. TOC is trying to find a TEFL teacher for home health. Patient also need assistance with medications.  Pharmacy is aware  Week summary from the other provider. Patient continued to complain about pain.  He was weaned off from Toradol and started on ibuprofen.  PICC line was  placed.  Decision was made to keep him in the hospital to complete a 6-week course of IV daptomycin which will be completed on 11/28/2021.  6/14: Patient continued to experience significant upper back and chest pain which interferes with his sleep.  Stating that ibuprofen is not helping and requesting to restart Toradol.  Also requesting to give Flexeril with Norco as it helps most. Toradol twice daily as needed was restarted. Also added MS Contin 15 mg twice daily Discussed with pharmacy to give Flexeril with Norco  6/15: Patient was able to sleep well after getting Toradol around midnight.  MS Contin which was started yesterday was not very helpful at this dose, dose was increased to 30 mg twice daily.  6/16: Remains stable.  We will continue with current pain regimen and daptomycin.  6/17: Pain remained uncontrolled again.  Unable to sleep last night.  MS Contin dose increased to 45 mg twice daily, Toradol made every 8 hourly as needed.  Also added Mobic.  6/18: Repeat labs were ordered for tomorrow.  Pain seems little improved today, able to get some sleep last night.  We will continue with current regimen. Patient is moving around without any difficulty, we will discontinue DVT prophylaxis today.  6/19: ESR stable in mid 50s, CBC and CMP stable.  Per patient only Toradol works for his pain.   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 Vancomycin --> Daptomycin on  10/15/21    not meeting sepsis criteria as of 10/11/21   Echocardiogram showed NO vegetations on TTE, should not need TEE since won't change IV abx course.        Pain control with toradol and oxycodone, MS Contin and newly added Mobic  Repeat (+)blood cultures 10/13/21, ID following, have asked neurosurgery to reevaluate (secure chat to Cameron Hammond 05/29)  Repeat  MRI with some progression of infection but neurosurgery would like to continue with conservative management.  Repeat blood cultures were done 6/1 remain negative in 5 days  PICC line was placed on 6/7  -Continue daptomycin-Will need for 6 weeks, end date 11/28/2021  -Continue with MS Contin at 45 mg twice daily  Continue Mobic  Continue with as needed Toradol for 2-3 more days  -Continue with Norco and Flexeril  Septic arthritis of cervical spine ([place]) Management as above  History of intravenous drug abuse ([place]) States last use was more than a year ago. Had a long discussion again to avoid missed use of PICC line, per patient he is done with drug abuse and staying sober since he moved out of [place].  Counseled on abstinence  Hypokalemia Resolved. -Continue to monitor and replete as needed   Subjective: Patient was seen and examined today.  He was afraid that once Toradol is taken away how he will manage his pain.  No other complaints.  He was requesting to continue with Toradol as that is the only medicine seems to help this time.  Physical Exam: Vitals:   11/03/21 1147 11/03/21 2220 11/04/21 0345 11/04/21 0840  BP: 123/68 122/88 119/89 106/74  Pulse: 83 92 80 72  Resp: _0 Temp: 97.7 F (36.5 C) 98.2 F (36.8 C) 97.8 F (36.6 C) 98.4 F (36.9 C)  TempSrc: Oral Oral Oral   SpO2: 100% 100% 100% 97%  Weight:      Height:       General.  Well-developed gentleman, in no acute distress. Pulmonary.  Lungs clear bilaterally, normal respiratory effort. CV.  Regular rate and rhythm, no JVD, rub or murmur. Abdomen.  Soft, nontender, nondistended, BS positive. CNS.  Alert and oriented .  No focal neurologic deficit. Extremities.  No edema, no cyanosis, pulses intact and symmetrical. Psychiatry.  Judgment and insight appears normal.  Data Reviewed: Prior data reviewed .  Family Communication:   Disposition: Status is: Inpatient Remains inpatient  appropriate because: Need prolonged IV antibiotics with history of IV drug use   Planned Discharge Destination: Home  Time spent: 42 minutes  This record has been created using Systems analyst. Errors have been sought and corrected,but may not always be located. Such creation errors do not reflect on the standard of care.  Author: Lorella Nimrod, MD 11/04/2021 2:33 PM  For on call review [email or web address].

## 2021-11-05 ENCOUNTER — Inpatient Hospital Stay: Payer: Self-pay

## 2021-11-05 MED ORDER — GADOBUTROL 1 MMOL/ML IV SOLN
7.5000 mL | Freq: Once | INTRAVENOUS | Status: AC | PRN
  Administered 2021-11-05: 7.5 mL via INTRAVENOUS

## 2021-11-05 MED ORDER — HYDROCODONE-ACETAMINOPHEN 10-325 MG PO TABS
1.0000 | ORAL_TABLET | ORAL | Status: DC
  Administered 2021-11-05 – 2021-11-18 (×77): 1 via ORAL
  Filled 2021-11-05 (×79): qty 1

## 2021-11-05 NOTE — Progress Notes (Signed)
Progress Note   Patient: Cameron Hammond XEN:[id number] DOB: 1975-08-16 DOA: 10/10/2021     26 DOS: the patient was seen and examined on 11/05/2021   Brief hospital course: Taken from prior notes.  [name] is a 46 year old male PMH back pain, IVDU last use 6 months prior to admission (varying history on this).  Presented to ED 10/10/21 complaining of pain in head, neck, upper back x1 week, no relief with chiropractor. [name] 05/25: In ED CT head/cervical spine and CXR negative, CTA chest no PE, paraspinal soft tissue thickening extending from T2-T5 concerning for osteomyelitis.  WBC 17.3.  Started on vancomycin, Zosyn.  Vital signs initially normal, later transient tachycardia/tachypnea, presumably due to pain, requiring IV Dilaudid for pain control.   ? MRI thoracic and cervical spine: Discitis/osteomyelitis at 2 to T3, extensive adjacent anterior and lateral paraspinal phlegmon extending to C6 and to T6, dorsal and ventral phlegmon T2-T4 ventrally and T1-T5 dorsally, probable septic arthritis of the right C2-C3 facet joint with adjacent facet osteomyelitis and right paraspinal soft tissue inflammation, right sided ventral and lateral phlegmon C2-C3.   ? MRSA on blood culture 3 of 4 bottles, Zosyn was d/c, vancomycin was continued. [name] 05/26: WBC trending down to 15.4. ID advising neurosurgery consult to eval for washout/procedure to reduce bioburden. TEE may not be needed since will not change therapy of at least 6 weeks abx IV. Repeat BCx in 48 hours to eval for clearance. Neurosurgery reviewed and communicated w/ me via secure chat, no plan at this point for surgical intervention.  [name] 05/27: pt reporting pain, see nurse notes from overnight. Otherwise remains of IV abx, ID to peripherally follow through the weekend (today is Saturday, Monday is Memorial Day). UTox (+)opiates (from inpatient administration meds), cannabinoids, tricyclics.  [name] 05/28: pain better on scheduled Norco 10 q6h, repeat cultures  pending . 05/29: (+) repeat blood cultures, ID following, changed abx to daptomycin. I have asked neurosurgery to reevaluate ([name]) . 05/30: No plans for NS or IR to go in and wash/debride. Contineu IV antibitoics per ID and repeat cultures per ID. May need long term IV Abx vs repeat imaging if persistent (+)Cx  5/31: [name].  Norco was added with Toradol.  ID is planning to repeat blood cultures tomorrow and if remain positive we will reimage.  6/1: Repeat blood cultures were obtained today.  MRI repeated yesterday which shows some progression of septic arthritis, discitis and osteomyelitis with some microabscesses. Per neurosurgery he still does not have any indication for surgical intervention and they were recommending continuing medical therapy.  6/2: Repeat blood cultures done yesterday remain negative in 24 hours.  Pain seems to be well controlled with Toradol and Norco.  No surgical intervention required at this time per neurosurgery.  Patient will remain in hospital for a long time to complete at least a 6 weeks course of IV antibiotics.  6/3: Clare 6/1 NGRD. No change in care plan.   6/5: Repeat blood cultures done on 10/17/2021 remain negative.  CRP improved to 3.  Clinically stable.   6/6: Blood cultures from 10/17/2021 remain negative for 5 days.  ESR improving and leukocytosis has been resolved.  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  PICC line ordered and ID is recommending outpatient IV daptomycin for 6 weeks with end date  of 11/28/2021. TOC is trying to find a TEFL teacher for home health. Patient also need assistance with medications.  Pharmacy is aware  Week summary from the other provider. Patient continued to complain about pain.  He was weaned off from Toradol and started on ibuprofen.  PICC line was  placed.  Decision was made to keep him in the hospital to complete a 6-week course of IV daptomycin which will be completed on 11/28/2021.  6/14: Patient continued to experience significant upper back and chest pain which interferes with his sleep.  Stating that ibuprofen is not helping and requesting to restart Toradol.  Also requesting to give Flexeril with Norco as it helps most. Toradol twice daily as needed was restarted. Also added MS Contin 15 mg twice daily Discussed with pharmacy to give Flexeril with Norco  6/15: Patient was able to sleep well after getting Toradol around midnight.  MS Contin which was started yesterday was not very helpful at this dose, dose was increased to 30 mg twice daily.  6/16: Remains stable.  We will continue with current pain regimen and daptomycin.  6/17: Pain remained uncontrolled again.  Unable to sleep last night.  MS Contin dose increased to 45 mg twice daily, Toradol made every 8 hourly as needed.  Also added Mobic.  6/18: Repeat labs were ordered for tomorrow.  Pain seems little improved today, able to get some sleep last night.  We will continue with current regimen. Patient is moving around without any difficulty, we will discontinue DVT prophylaxis today.  6/19: ESR stable in mid 50s, slight worsening of CRP, CBC and CMP stable.  Per patient only Toradol works for his pain.  6/20: Patient continued to have significant pain, Norco was made scheduled along with MS Contin, Toradol will remain as needed.  Due to slightly increased ESR and CRP along with worsening pain, we will repeat thoracic spine MRI today.   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 Vancomycin --> Daptomycin on  10/15/21    not meeting sepsis criteria as of 10/11/21   Echocardiogram showed NO vegetations on TTE, should not  need TEE since won't change IV abx course.        Pain control with toradol and oxycodone, MS Contin and newly added Mobic  Repeat (+)blood cultures 10/13/21, ID following, have asked neurosurgery to reevaluate (secure chat to [name] 05/29)  Repeat MRI with some progression of infection but neurosurgery would like to continue with conservative management.  Repeat blood cultures were done 6/1 remain negative in 5 days  PICC line was placed on 6/7  -Continue daptomycin-Will need for 6 weeks, end date 11/28/2021  -Continue with MS Contin at 45 mg twice daily  Continue Mobic  Try giving Norco as scheduled  Continue with as needed Toradol for 2-3 more days  -Continue with  Flexeril  Repeat MRI of cervical and thoracic spine  Septic arthritis of cervical spine ([place]) Management as above  History of intravenous drug abuse ([place]) States last use was more than a year ago. Had a long discussion again to avoid missed use of PICC line, per patient he is done with drug abuse and staying sober since he moved out of [place].  Counseled on abstinence  Hypokalemia Resolved. -Continue to monitor and replete as needed   Subjective: Patient continued to have significant pain and unable  to rest.  Physical Exam: Vitals:   11/04/21 2004 11/05/21 0423 11/05/21 0724 11/05/21 1159  BP: 133/87 120/68 103/69 120/80  Pulse: 85 75 73 84  Resp: _0 Temp: 98.4 F (36.9 C) 98.2 F (36.8 C) 97.6 F (36.4 C) 98.2 F (36.8 C)  TempSrc:   Oral Oral  SpO2: 99% 97% 98% 100%  Weight:      Height:       General.  Well-developed gentleman, in no acute distress. Pulmonary.  Lungs clear bilaterally, normal respiratory effort. CV.  Regular rate and rhythm, no JVD, rub or murmur. Abdomen.  Soft, nontender, nondistended, BS positive. CNS.  Alert and oriented .  No focal neurologic deficit. Extremities.  No edema, no cyanosis, pulses intact and symmetrical. Psychiatry.  Judgment  and insight appears normal.  Data Reviewed: Prior data reviewed  Family Communication: Discussed with patient  Disposition: Status is: Inpatient Remains inpatient appropriate because: Severity of illness, will need prolonged IV antibiotics.   Planned Discharge Destination: Home  Time spent: 45 minutes  This record has been created using Systems analyst. Errors have been sought and corrected,but may not always be located. Such creation errors do not reflect on the standard of care.  Author: Lorella Nimrod, MD 11/05/2021 4:03 PM  For on call review [email or web address].

## 2021-11-05 NOTE — Assessment & Plan Note (Addendum)
Suspect hematogenous seeding from prior IVDU Cx (+)MRSA MRI 05/25 see results   Continue abx. Vancomycin --> Daptomycin on  10/15/21    not meeting sepsis criteria as of 10/11/21   Echocardiogram showed NO vegetations on TTE, should not need TEE since won't change IV abx course.        Pain control with toradol and oxycodone, MS Contin and newly added Mobic  Repeat (+)blood cultures 10/13/21, ID following, have asked neurosurgery to reevaluate (secure chat to [name] 05/29)  Repeat MRI with some progression of infection but neurosurgery would like to continue with conservative management.  Repeat blood cultures were done 6/1 remain negative in 5 days  PICC line was placed on 6/7  -Continue daptomycin-Will need for 6 weeks, end date 11/28/2021  -Continue with MS Contin at 45 mg twice daily  Continue Mobic  Try giving Norco as scheduled  Continue with as needed Toradol for 2-3 more days  -Continue with  Flexeril  Repeat MRI of cervical and thoracic spine

## 2021-11-05 NOTE — Assessment & Plan Note (Signed)
Management as above °

## 2021-11-05 NOTE — Progress Notes (Signed)
Pharmacy Antibiotic Note  Cameron Hammond is a 46 y.o. male admitted on 10/10/2021 with sepsis d/t MRSA bacteremia.  Pharmacy has been consulted for Daptomycin dosing.  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    No Known Allergies  Antimicrobials this admission: Zosyn 5/25>>5/25 vancomycin 5/25 >> 5/29 5/30 Daptomycin >>  Ceftaroline 5/31>> 6/10   Microbiology results: -6/1 repeat Bcx: NG x 5d -5/28 repeat Bcx 3of4: still MRSA + -5/25:Bcx 4of4 : MRSA+   ID consult(will need 6 weeks abx)  MRI : Discitis-osteomyelitis at T2-T3,  with extensive adjacent anterior and lateral paraspinal phlegmon  Thank you for allowing pharmacy to be a part of this patient's care.  8/1 PharmD Clinical Pharmacist 11/05/2021

## 2021-11-05 NOTE — Progress Notes (Signed)
ID Patient in pain States Toradol is not controlling the pain He has midthoracic pain and radiating to both sides of the back as well as to the front of the chest No fever  O/e BP 107/65 (BP Location: Right Arm)   Pulse 79   Temp 98 F (36.7 C)   Resp 16   Ht 5' 7"  (1.702 m)   Wt 74.3 kg   SpO2 97%   BMI 25.66 kg/m   Awake and alert in some distress Chest bilateral air entry Heart sounds 1  Abdomen soft Tenderness in the mid thoracic spine CNS nonfocal  Labs    Latest Ref Rng & Units 11/04/2021    6:58 AM 10/22/2021    6:03 AM 10/15/2021    2:20 PM  CBC  WBC 4.0 - 10.5 K/uL 6.2  7.8  13.3   Hemoglobin 13.0 - 17.0 g/dL 11.9  11.1  11.9   Hematocrit 39.0 - 52.0 % 36.3  33.5  35.6   Platelets 150 - 400 K/uL 252  584  574        Latest Ref Rng & Units 11/04/2021    6:58 AM 10/30/2021    5:55 AM 10/26/2021    5:30 AM  CMP  Glucose 70 - 99 mg/dL 95     BUN 6 - 20 mg/dL 19     Creatinine 0.61 - 1.24 mg/dL 0.79  0.63  0.89   Sodium 135 - 145 mmol/L 139     Potassium 3.5 - 5.1 mmol/L 4.9     Chloride 98 - 111 mmol/L 100     CO2 22 - 32 mmol/L 31     Calcium 8.9 - 10.3 mg/dL 9.8     Total Protein 6.5 - 8.1 g/dL 7.6     Total Bilirubin 0.3 - 1.2 mg/dL 0.5     Alkaline Phos 38 - 126 U/L 116     AST 15 - 41 U/L 23     ALT 0 - 44 U/L 40     Micro 583 blood culture MRSA 10/05/2021 blood culture MRSA 10/17/2021 blood culture no growth  Impression/recommendation MRSA bacteremia spine infection with T2-T3 osteomyelitis and discitis and associated epidural phlegmon.'s paraspinous phlegmon T2-T3 and T3-T4.  Seen by neurosurgeon and did not think his knee surgery was necessary Patient is on daptomycin.  He has been complaining of more pain in the back with mildly elevated ESR and CRP Patient is staying in the hospital for 6 weeks to complete his antibiotic as he has a history of IV drug use.  Discussed with hospitalist Plan is for repeat MRI of the thoracic and cervical spine.   Discussed management with the patient

## 2021-11-06 LAB — CREATININE, SERUM
Creatinine, Ser: 0.83 mg/dL (ref 0.61–1.24)
GFR, Estimated: >60 mL/min (ref >60)

## 2021-11-06 MED ORDER — KETOROLAC TROMETHAMINE 15 MG/ML IJ SOLN
15.0000 mg | Freq: Three times a day (TID) | INTRAMUSCULAR | Status: DC | PRN
  Administered 2021-11-06 – 2021-11-07 (×2): 15 mg via INTRAVENOUS
  Filled 2021-11-06 (×2): qty 1

## 2021-11-06 NOTE — Progress Notes (Signed)
PROGRESS NOTE    Cameron Hammond  SFK:[id number] DOB: 1975/06/14 DOA: 10/10/2021 PCP: Patient, No Pcp Per    Brief Narrative:  Cameron Hammond is a 46 year old male PMH back pain, IVDU last use 6 months prior to admission (varying history on this).  Presented to ED 10/10/21 complaining of pain in head, neck, upper back x1 week, no relief with chiropractor. 05/25: In ED CT head/cervical spine and CXR negative, CTA chest no PE, paraspinal soft tissue thickening extending from T2-T5 concerning for osteomyelitis.  WBC 17.3.  Started on vancomycin, Zosyn.  Vital signs initially normal, later transient tachycardia/tachypnea, presumably due to pain, requiring IV Dilaudid for pain control.   MRI thoracic and cervical spine: Discitis/osteomyelitis at 2 to T3, extensive adjacent anterior and lateral paraspinal phlegmon extending to C6 and to T6, dorsal and ventral phlegmon T2-T4 ventrally and T1-T5 dorsally, probable septic arthritis of the right C2-C3 facet joint with adjacent facet osteomyelitis and right paraspinal soft tissue inflammation, right sided ventral and lateral phlegmon C2-C3.   MRSA on blood culture 3 of 4 bottles, Zosyn was d/c, vancomycin was continued. 05/26: WBC trending down to 15.4. ID advising neurosurgery consult to eval for washout/procedure to reduce bioburden. TEE may not be needed since will not change therapy of at least 6 weeks abx IV. Repeat BCx in 48 hours to eval for clearance. Neurosurgery reviewed and communicated w/ me via secure chat, no plan at this point for surgical intervention.  05/27: pt reporting pain, see nurse notes from overnight. Otherwise remains of IV abx, ID to peripherally follow through the weekend (today is Saturday, Monday is Memorial Day). UTox (+)opiates (from inpatient administration meds), cannabinoids, tricyclics.  05/28: pain better on scheduled Norco 10 q6h, repeat cultures pending 05/29: (+) repeat blood cultures, ID following, changed abx to daptomycin. I  have asked neurosurgery to reevaluate ([name]) 05/30: No plans for NS or IR to go in and wash/debride. Contineu IV antibitoics per ID and repeat cultures per ID. May need long term IV Abx vs repeat imaging if persistent (+)Cx   5/31: [name].  Norco was added with Toradol.  ID is planning to repeat blood cultures tomorrow and if remain positive we will reimage.   6/1: Repeat blood cultures were obtained today.  MRI repeated yesterday which shows some progression of septic arthritis, discitis and osteomyelitis with some microabscesses. Per neurosurgery he still does not have any indication for surgical intervention and they were recommending continuing medical therapy.   6/2: Repeat blood cultures done yesterday remain negative in 24 hours.  Pain seems to be well controlled with Toradol and Norco.  No surgical intervention required at this time per neurosurgery.  Patient will remain in hospital for a long time to complete at least a 6 weeks course of IV antibiotics.   6/3: Rankin 6/1 NGRD. No change in care plan.    6/5: Repeat blood cultures done on 10/17/2021 remain negative.  CRP improved to 3.  Clinically stable.    6/6: Blood cultures from 10/17/2021 remain negative for 5 days.  ESR improving and leukocytosis has been resolved.  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  PICC line ordered and ID is recommending outpatient IV daptomycin for 6 weeks with end date of 11/28/2021. TOC is trying to find a TEFL teacher for home health. Patient  also need assistance with medications.  Pharmacy is aware   Week summary from the other provider. Patient continued to complain about pain.  He was weaned off from Toradol and started on ibuprofen.  PICC line was placed.  Decision was made to keep him in the hospital to complete a 6-week course  of IV daptomycin which will be completed on 11/28/2021.   6/14: Patient continued to experience significant upper back and chest pain which interferes with his sleep.  Stating that ibuprofen is not helping and requesting to restart Toradol.  Also requesting to give Flexeril with Norco as it helps most. Toradol twice daily as needed was restarted. Also added MS Contin 15 mg twice daily Discussed with pharmacy to give Flexeril with Norco   6/15: Patient was able to sleep well after getting Toradol around midnight.  MS Contin which was started yesterday was not very helpful at this dose, dose was increased to 30 mg twice daily.   6/16: Remains stable.  We will continue with current pain regimen and daptomycin.   6/17: Pain remained uncontrolled again.  Unable to sleep last night.  MS Contin dose increased to 45 mg twice daily, Toradol made every 8 hourly as needed.  Also added Mobic.   6/18: Repeat labs were ordered for tomorrow.  Pain seems little improved today, able to get some sleep last night.  We will continue with current regimen. Patient is moving around without any difficulty, we will discontinue DVT prophylaxis today.   6/19: ESR stable in mid 50s, slight worsening of CRP, CBC and CMP stable.  Per patient only Toradol works for his pain.   6/20: Patient continued to have significant pain, Norco was made scheduled along with MS Contin, Toradol will remain as needed.  Due to slightly increased ESR and CRP along with worsening pain, we will repeat thoracic spine MRI today  6/21 MRI completed . See full report- slightly decreased epidural phlegmon in ventral epidural space. Findings consist. With persitent acute septic arthritis. Asking about IV pain meds, does not want his Toradol IV to be discontinued in case he needs it.  Consultants:  ID  Procedures:   Antimicrobials:  Daptomycin    Subjective: No pain this am. No complaints.   Objective: Vitals:   11/05/21 1648 11/05/21  1919 11/06/21 0319 11/06/21 0740  BP: 128/87 107/65 112/66 111/74  Pulse: 92 79 77 79  Resp: _0 Temp:  98 F (36.7 C) 98.2 F (36.8 C) 98.3 F (36.8 C)  TempSrc:      SpO2: 100% 97% 97% 98%  Weight:      Height:        Intake/Output Summary (Last 24 hours) at 11/06/2021 0911 Last data filed at 11/05/2021 1032 Gross per 24 hour  Intake 240 ml  Output 1 ml  Net 239 ml   Filed Weights   10/10/21 1152 10/20/21 1202  Weight: 79.4 kg 74.3 kg    Examination: Calm, NAD Cta no w/r Reg s1/s2 no gallop Soft benign +bs No edema Aaoxox3  Mood and affect appropriate in current setting     Data Reviewed: I have personally reviewed following labs and imaging studies  CBC: Recent Labs  Lab 11/04/21 0658  WBC 6.2  HGB 11.9*  HCT 36.3*  MCV 88.3  PLT 010   Basic Metabolic Panel: Recent Labs  Lab 11/04/21 0658 11/06/21 0630  NA 139  --   K 4.9  --   CL 100  --  CO2 31  --   GLUCOSE 95  --   BUN 19  --   CREATININE 0.79 0.83  CALCIUM 9.8  --    GFR: Estimated Creatinine Clearance: 105.1 mL/min (by C-G formula based on SCr of 0.83 mg/dL). Liver Function Tests: Recent Labs  Lab 11/04/21 0658  AST 23  ALT 40  ALKPHOS 116  BILITOT 0.5  PROT 7.6  ALBUMIN 3.9   No results for input(s): "LIPASE", "AMYLASE" in the last 168 hours. No results for input(s): "AMMONIA" in the last 168 hours. Coagulation Profile: No results for input(s): "INR", "PROTIME" in the last 168 hours. Cardiac Enzymes: Recent Labs  Lab 11/04/21 0658  CKTOTAL 80   BNP (last 3 results) No results for input(s): "PROBNP" in the last 8760 hours. HbA1C: No results for input(s): "HGBA1C" in the last 72 hours. CBG: No results for input(s): "GLUCAP" in the last 168 hours. Lipid Profile: No results for input(s): "CHOL", "HDL", "LDLCALC", "TRIG", "CHOLHDL", "LDLDIRECT" in the last 72 hours. Thyroid Function Tests: No results for input(s): "TSH", "T4TOTAL", "FREET4", "T3FREE",  "THYROIDAB" in the last 72 hours. Anemia Panel: No results for input(s): "VITAMINB12", "FOLATE", "FERRITIN", "TIBC", "IRON", "RETICCTPCT" in the last 72 hours. Sepsis Labs: No results for input(s): "PROCALCITON", "LATICACIDVEN" in the last 168 hours.  No results found for this or any previous visit (from the past 240 hour(s)).       Radiology Studies: MR THORACIC SPINE W WO CONTRAST  Result Date: 11/06/2021 CLINICAL DATA:  Discitis-osteomyelitis EXAM: MRI CERVICAL AND THORACIC SPINE WITHOUT AND WITH CONTRAST TECHNIQUE: Multiplanar and multiecho pulse sequences of the cervical spine, to include the craniocervical junction and cervicothoracic junction, and the thoracic spine, were obtained without and with intravenous contrast. CONTRAST:  7.80m GADAVIST GADOBUTROL 1 MMOL/ML IV SOLN COMPARISON:  10/16/2021 FINDINGS: MRI CERVICAL SPINE FINDINGS Alignment: Unchanged trace anterolisthesis of C2 on C3. Vertebrae: Overall unchanged marrow edema and enhancement about the right C2-C3 facets, consistent with ongoing septic arthritis. Again noted is the associated joint effusion and adjacent soft tissue inflammation and enhancement which also appears unchanged from the prior exam redemonstrated small microabscesses in the right paraspinous musculature posterior to the inflamed right C2-C3 facets (series 12, image 7). Mild epidural enhancement without a discrete collection. No additional findings concerning for discitis osteomyelitis in the cervical spine. No acute fracture or suspicious osseous lesion. Cord: Normal signal and morphology. No abnormal spinal cord enhancement. No epidural collection in the cervical spine. Posterior Fossa, vertebral arteries, paraspinal tissues: Microabscesses in the right paraspinous musculature posterior to the C2-C3 facets, described above. No additional acute finding. Disc levels: C2-C3: Trace anterolisthesis with mild disc bulge. Right-greater-than-left uncovertebral and facet  hypertrophy. No spinal canal stenosis. Mild left and moderate right neural foraminal narrowing, unchanged. C3-C4: No significant disc bulge. Uncovertebral and facet arthropathy. No spinal canal stenosis. Moderate left and mild right neural foraminal narrowing, unchanged. C4-C5: Minimal disc bulge. Uncovertebral and right-greater-than-left facet arthropathy. No spinal canal stenosis. Moderate right neural foraminal narrowing, unchanged. C5-C6: Disc bulge with right paracentral protrusion. Facet and uncovertebral hypertrophy. Mild spinal canal stenosis and moderate bilateral neural foraminal narrowing, unchanged. C6-C7: Mild disc bulge. Facet and uncovertebral hypertrophy. Mild spinal canal stenosis and moderate bilateral neural foraminal narrowing, unchanged C7-T1: No significant disc bulge. No spinal canal stenosis or neuroforaminal narrowing. MRI THORACIC SPINE FINDINGS Alignment: Preservation of the normal thoracic kyphosis. No significant listhesis. Vertebrae: Redemonstrated findings consistent with discitis osteomyelitis at T2-T4 with grossly unchanged distribution of marrow edema and enhancement, but  slightly increased intensity. Decreased disc height at T3-T4. Preserved disc height at T2-T3. No new level of involvement. Decreased amount of ventral epidural phlegmon extending from the superior aspect of T2 through the inferior aspect of T4, which measures up to 3 mm in greatest AP dimension (series 23, image 12), previously 5 mm. Overall unchanged dorsal epidural enhancement is noted from C7-T1 through T5-T6 (series 24, images 9-10) with similar trace dorsal epidural phlegmon/abscess at the level of T2-T3. Decreased spinal canal stenosis at T2-T3, now minimal. No evidence of other affected levels. No suspicious osseous lesions. Cord: Redemonstrated trace syrinx at T4-T5. Otherwise normal in signal and morphology. No abnormal enhancement. Paraspinal and other soft tissues: Redemonstrated paraspinous phlegmon and  inflammation about T2, T3, and T4, with redemonstrated microabscesses within it (series 20, image 13). Disc levels: Redemonstrated degenerative changes.  No new or progressive finding. IMPRESSION: 1. Findings consistent with persistent acute septic arthritis involving the right C2-C3 facet which appears overall unchanged from the prior exam. Again noted is adjacent paraspinous soft tissue inflammation with a few small microabscesses. 2. Redemonstrated findings consistent with discitis osteomyelitis at T2-T4, with slightly decreased epidural phlegmon in the ventral epidural space, now measuring up to 3 mm in maximal AP diameter, previously 5 mm, now with minimal spinal canal stenosis at this level. Otherwise unchanged dorsal epidural enhancement and paraspinous phlegmon. Electronically Signed   By: Merilyn Baba M.D.   On: 11/06/2021 00:54   MR CERVICAL SPINE W WO CONTRAST  Result Date: 11/06/2021 CLINICAL DATA:  Discitis-osteomyelitis EXAM: MRI CERVICAL AND THORACIC SPINE WITHOUT AND WITH CONTRAST TECHNIQUE: Multiplanar and multiecho pulse sequences of the cervical spine, to include the craniocervical junction and cervicothoracic junction, and the thoracic spine, were obtained without and with intravenous contrast. CONTRAST:  7.58m GADAVIST GADOBUTROL 1 MMOL/ML IV SOLN COMPARISON:  10/16/2021 FINDINGS: MRI CERVICAL SPINE FINDINGS Alignment: Unchanged trace anterolisthesis of C2 on C3. Vertebrae: Overall unchanged marrow edema and enhancement about the right C2-C3 facets, consistent with ongoing septic arthritis. Again noted is the associated joint effusion and adjacent soft tissue inflammation and enhancement which also appears unchanged from the prior exam redemonstrated small microabscesses in the right paraspinous musculature posterior to the inflamed right C2-C3 facets (series 12, image 7). Mild epidural enhancement without a discrete collection. No additional findings concerning for discitis osteomyelitis  in the cervical spine. No acute fracture or suspicious osseous lesion. Cord: Normal signal and morphology. No abnormal spinal cord enhancement. No epidural collection in the cervical spine. Posterior Fossa, vertebral arteries, paraspinal tissues: Microabscesses in the right paraspinous musculature posterior to the C2-C3 facets, described above. No additional acute finding. Disc levels: C2-C3: Trace anterolisthesis with mild disc bulge. Right-greater-than-left uncovertebral and facet hypertrophy. No spinal canal stenosis. Mild left and moderate right neural foraminal narrowing, unchanged. C3-C4: No significant disc bulge. Uncovertebral and facet arthropathy. No spinal canal stenosis. Moderate left and mild right neural foraminal narrowing, unchanged. C4-C5: Minimal disc bulge. Uncovertebral and right-greater-than-left facet arthropathy. No spinal canal stenosis. Moderate right neural foraminal narrowing, unchanged. C5-C6: Disc bulge with right paracentral protrusion. Facet and uncovertebral hypertrophy. Mild spinal canal stenosis and moderate bilateral neural foraminal narrowing, unchanged. C6-C7: Mild disc bulge. Facet and uncovertebral hypertrophy. Mild spinal canal stenosis and moderate bilateral neural foraminal narrowing, unchanged C7-T1: No significant disc bulge. No spinal canal stenosis or neuroforaminal narrowing. MRI THORACIC SPINE FINDINGS Alignment: Preservation of the normal thoracic kyphosis. No significant listhesis. Vertebrae: Redemonstrated findings consistent with discitis osteomyelitis at T2-T4 with grossly unchanged distribution of  marrow edema and enhancement, but slightly increased intensity. Decreased disc height at T3-T4. Preserved disc height at T2-T3. No new level of involvement. Decreased amount of ventral epidural phlegmon extending from the superior aspect of T2 through the inferior aspect of T4, which measures up to 3 mm in greatest AP dimension (series 23, image 12), previously 5 mm.  Overall unchanged dorsal epidural enhancement is noted from C7-T1 through T5-T6 (series 24, images 9-10) with similar trace dorsal epidural phlegmon/abscess at the level of T2-T3. Decreased spinal canal stenosis at T2-T3, now minimal. No evidence of other affected levels. No suspicious osseous lesions. Cord: Redemonstrated trace syrinx at T4-T5. Otherwise normal in signal and morphology. No abnormal enhancement. Paraspinal and other soft tissues: Redemonstrated paraspinous phlegmon and inflammation about T2, T3, and T4, with redemonstrated microabscesses within it (series 20, image 13). Disc levels: Redemonstrated degenerative changes.  No new or progressive finding. IMPRESSION: 1. Findings consistent with persistent acute septic arthritis involving the right C2-C3 facet which appears overall unchanged from the prior exam. Again noted is adjacent paraspinous soft tissue inflammation with a few small microabscesses. 2. Redemonstrated findings consistent with discitis osteomyelitis at T2-T4, with slightly decreased epidural phlegmon in the ventral epidural space, now measuring up to 3 mm in maximal AP diameter, previously 5 mm, now with minimal spinal canal stenosis at this level. Otherwise unchanged dorsal epidural enhancement and paraspinous phlegmon. Electronically Signed   By: Merilyn Baba M.D.   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 Vancomycin --> Daptomycin on  10/15/21   not meeting sepsis criteria as of 10/11/21  Echocardiogram showed NO vegetations on TTE, should not need TEE since won't change IV abx course.        Pain control with toradol and oxycodone, MS Contin and newly added Mobic Repeat (+)blood cultures 10/13/21, ID following, have asked neurosurgery to reevaluate (secure chat to [name] 05/29) Repeat MRI with some progression of infection but neurosurgery would like to continue with conservative management. Repeat blood cultures were done 6/1 remain negative in 5 days PICC line was placed on 6/7 -Continue daptomycin-Will need for 6 weeks, end date 11/28/2021 6/21 Repeat MRI due to increase pain, see full report. Overall some improvement, and some findings about the same. Pain mx with MS contin, IV toradol prn, Mobic, norco D/w Pt that IV toradol only for couple of more days, need to monitor renal fxn periodically and wean off iv pain mecs.    Septic arthritis of cervical spine (HCC) Mx as above   History of intravenous drug abuse (  [place]) DVT counseling about this during his hospitalization   Hypokalemia Repleted and resolved  DVT prophylaxis: SCD Code Status: Full Family Communication: None at bedside  Disposition Plan: 11/28/2021 Status is: Inpatient Remains inpatient appropriate because: IV treatment, needs this as inhouse as pt with h/xo IVDU        LOS: 27 days   Time spent: 87 min    Nolberto Hanlon, MD [facility] Pager [phone]  If 7PM-7AM, please contact night-coverage 11/06/2021, 9:11 AM

## 2021-11-06 NOTE — Plan of Care (Signed)

## 2021-11-07 LAB — POTASSIUM: Potassium: 4.2 mmol/L (ref 3.5–5.1)

## 2021-11-07 LAB — C-REACTIVE PROTEIN: CRP: 3.9 mg/dL — ABNORMAL HIGH (ref <1.0)

## 2021-11-07 MED ORDER — PANTOPRAZOLE SODIUM 20 MG PO TBEC
20.0000 mg | DELAYED_RELEASE_TABLET | Freq: Every day | ORAL | Status: DC
  Administered 2021-11-08 – 2021-11-18 (×11): 20 mg via ORAL
  Filled 2021-11-07 (×12): qty 1

## 2021-11-07 NOTE — Plan of Care (Signed)

## 2021-11-07 NOTE — Progress Notes (Signed)
PROGRESS NOTE    Cameron Hammond  MMN:[id number] DOB: 1975-05-22 DOA: 10/10/2021 PCP: Patient, No Pcp Per    Brief Narrative:  Mr. Faircloth is a 46 year old male PMH back pain, IVDU last use 6 months prior to admission (varying history on this).  Presented to ED 10/10/21 complaining of pain in head, neck, upper back x1 week, no relief with chiropractor. 05/25: In ED CT head/cervical spine and CXR negative, CTA chest no PE, paraspinal soft tissue thickening extending from T2-T5 concerning for osteomyelitis.  WBC 17.3.  Started on vancomycin, Zosyn.  Vital signs initially normal, later transient tachycardia/tachypnea, presumably due to pain, requiring IV Dilaudid for pain control.   MRI thoracic and cervical spine: Discitis/osteomyelitis at 2 to T3, extensive adjacent anterior and lateral paraspinal phlegmon extending to C6 and to T6, dorsal and ventral phlegmon T2-T4 ventrally and T1-T5 dorsally, probable septic arthritis of the right C2-C3 facet joint with adjacent facet osteomyelitis and right paraspinal soft tissue inflammation, right sided ventral and lateral phlegmon C2-C3.   MRSA on blood culture 3 of 4 bottles, Zosyn was d/c, vancomycin was continued. 05/26: WBC trending down to 15.4. ID advising neurosurgery consult to eval for washout/procedure to reduce bioburden. TEE may not be needed since will not change therapy of at least 6 weeks abx IV. Repeat BCx in 48 hours to eval for clearance. Neurosurgery reviewed and communicated w/ me via secure chat, no plan at this point for surgical intervention.  05/27: pt reporting pain, see nurse notes from overnight. Otherwise remains of IV abx, ID to peripherally follow through the weekend (today is Saturday, Monday is Memorial Day). UTox (+)opiates (from inpatient administration meds), cannabinoids, tricyclics.  05/28: pain better on scheduled Norco 10 q6h, repeat cultures pending 05/29: (+) repeat blood cultures, ID following, changed abx to daptomycin. I  have asked neurosurgery to reevaluate ([name]) 05/30: No plans for NS or IR to go in and wash/debride. Contineu IV antibitoics per ID and repeat cultures per ID. May need long term IV Abx vs repeat imaging if persistent (+)Cx   5/31: [name].  Norco was added with Toradol.  ID is planning to repeat blood cultures tomorrow and if remain positive we will reimage.   6/1: Repeat blood cultures were obtained today.  MRI repeated yesterday which shows some progression of septic arthritis, discitis and osteomyelitis with some microabscesses. Per neurosurgery he still does not have any indication for surgical intervention and they were recommending continuing medical therapy.   6/2: Repeat blood cultures done yesterday remain negative in 24 hours.  Pain seems to be well controlled with Toradol and Norco.  No surgical intervention required at this time per neurosurgery.  Patient will remain in hospital for a long time to complete at least a 6 weeks course of IV antibiotics.   6/3: Mila Doce 6/1 NGRD. No change in care plan.    6/5: Repeat blood cultures done on 10/17/2021 remain negative.  CRP improved to 3.  Clinically stable.    6/6: Blood cultures from 10/17/2021 remain negative for 5 days.  ESR improving and leukocytosis has been resolved.  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  PICC line ordered and ID is recommending outpatient IV daptomycin for 6 weeks with end date of 11/28/2021. TOC is trying to find a TEFL teacher for home health. Patient  also need assistance with medications.  Pharmacy is aware   Week summary from the other provider. Patient continued to complain about pain.  He was weaned off from Toradol and started on ibuprofen.  PICC line was placed.  Decision was made to keep him in the hospital to complete a 6-week course  of IV daptomycin which will be completed on 11/28/2021.   6/14: Patient continued to experience significant upper back and chest pain which interferes with his sleep.  Stating that ibuprofen is not helping and requesting to restart Toradol.  Also requesting to give Flexeril with Norco as it helps most. Toradol twice daily as needed was restarted. Also added MS Contin 15 mg twice daily Discussed with pharmacy to give Flexeril with Norco   6/15: Patient was able to sleep well after getting Toradol around midnight.  MS Contin which was started yesterday was not very helpful at this dose, dose was increased to 30 mg twice daily.   6/16: Remains stable.  We will continue with current pain regimen and daptomycin.   6/17: Pain remained uncontrolled again.  Unable to sleep last night.  MS Contin dose increased to 45 mg twice daily, Toradol made every 8 hourly as needed.  Also added Mobic.   6/18: Repeat labs were ordered for tomorrow.  Pain seems little improved today, able to get some sleep last night.  We will continue with current regimen. Patient is moving around without any difficulty, we will discontinue DVT prophylaxis today.   6/19: ESR stable in mid 50s, slight worsening of CRP, CBC and CMP stable.  Per patient only Toradol works for his pain.   6/20: Patient continued to have significant pain, Norco was made scheduled along with MS Contin, Toradol will remain as needed.  Due to slightly increased ESR and CRP along with worsening pain, we will repeat thoracic spine MRI today  6/21 MRI completed . See full report- slightly decreased epidural phlegmon in ventral epidural space. Findings consist. With persitent acute septic arthritis. Asking about IV pain meds, does not want his Toradol IV to be discontinued in case he needs it. 6/22 patient is upset because his IV Toradol is being discontinued.  Discussed with patient and that he has been on it for longer than intended.  Is risk for AKI and GI  bleed.  Also discussed with him this is per pharmacy through at [facility].  I have asked pharmacy to educate patient.  He is asking for Dilaudid now p.o.  The pain was controlled on current regimen as of yesterday but today he is complaining of wanting Dilaudid p.o. since I have taking him off of IV Toradol.  He does become very angry and agitated with hearing that he cannot have IV Toradol at this time nor Dilaudid with his history of drug abuse.  Consultants:  ID  Procedures:   Antimicrobials:  Daptomycin    Subjective: No sob, no cp  Objective: Vitals:   11/06/21 1151 11/06/21 1937 11/07/21 0553 11/07/21 0737  BP: 114/79 131/77 108/66 109/68  Pulse: 86 79 86 86  Resp:  _0 Temp: 98.3 F (36.8 C) 97.8 F (36.6 C) 97.7 F (36.5 C) 97.7 F (36.5 C)  TempSrc: Oral     SpO2: 100% 100% 100% 99%  Weight:      Height:        Intake/Output Summary (Last 24 hours) at 11/07/2021 0850 Last data filed at 11/06/2021 1826 Gross per 24 hour  Intake 326.04 ml  Output --  Net 326.04 ml   Filed Weights   10/10/21 1152 10/20/21 1202  Weight: 79.4 kg 74.3 kg    Examination: Aggitated, angry  Cta no w/r Reg s1/s2 no gallop Soft benign +bs No edema Aaoxox3  Mood and affect appropriate in current setting     Data Reviewed: I have personally reviewed following labs and imaging studies  CBC: Recent Labs  Lab 11/04/21 0658  WBC 6.2  HGB 11.9*  HCT 36.3*  MCV 88.3  PLT 814   Basic Metabolic Panel: Recent Labs  Lab 11/04/21 0658 11/06/21 0630 11/07/21 0542  NA 139  --   --   K 4.9  --  4.2  CL 100  --   --   CO2 31  --   --   GLUCOSE 95  --   --   BUN 19  --   --   CREATININE 0.79 0.83  --   CALCIUM 9.8  --   --    GFR: Estimated Creatinine Clearance: 105.1 mL/min (by C-G formula based on SCr of 0.83 mg/dL). Liver Function Tests: Recent Labs  Lab 11/04/21 0658  AST 23  ALT 40  ALKPHOS 116  BILITOT 0.5  PROT 7.6  ALBUMIN 3.9   No results for input(s):  "LIPASE", "AMYLASE" in the last 168 hours. No results for input(s): "AMMONIA" in the last 168 hours. Coagulation Profile: No results for input(s): "INR", "PROTIME" in the last 168 hours. Cardiac Enzymes: Recent Labs  Lab 11/04/21 0658  CKTOTAL 80   BNP (last 3 results) No results for input(s): "PROBNP" in the last 8760 hours. HbA1C: No results for input(s): "HGBA1C" in the last 72 hours. CBG: No results for input(s): "GLUCAP" in the last 168 hours. Lipid Profile: No results for input(s): "CHOL", "HDL", "LDLCALC", "TRIG", "CHOLHDL", "LDLDIRECT" in the last 72 hours. Thyroid Function Tests: No results for input(s): "TSH", "T4TOTAL", "FREET4", "T3FREE", "THYROIDAB" in the last 72 hours. Anemia Panel: No results for input(s): "VITAMINB12", "FOLATE", "FERRITIN", "TIBC", "IRON", "RETICCTPCT" in the last 72 hours. Sepsis Labs: No results for input(s): "PROCALCITON", "LATICACIDVEN" in the last 168 hours.  No results found for this or any previous visit (from the past 240 hour(s)).       Radiology Studies: MR THORACIC SPINE W WO CONTRAST  Result Date: 11/06/2021 CLINICAL DATA:  Discitis-osteomyelitis EXAM: MRI CERVICAL AND THORACIC SPINE WITHOUT AND WITH CONTRAST TECHNIQUE: Multiplanar and multiecho pulse sequences of the cervical spine, to include the craniocervical junction and cervicothoracic junction, and the thoracic spine, were obtained without and with intravenous contrast. CONTRAST:  7.54m GADAVIST GADOBUTROL 1 MMOL/ML IV SOLN COMPARISON:  10/16/2021 FINDINGS: MRI CERVICAL SPINE FINDINGS Alignment: Unchanged trace anterolisthesis of C2 on C3. Vertebrae: Overall unchanged marrow edema and enhancement about the right C2-C3 facets, consistent with ongoing septic arthritis. Again noted is the associated joint effusion and adjacent soft tissue inflammation and enhancement which also appears unchanged from the prior exam redemonstrated small microabscesses in the right paraspinous  musculature posterior to the inflamed right C2-C3 facets (series 12, image 7). Mild epidural enhancement without a discrete collection. No additional findings concerning for discitis osteomyelitis in the cervical spine. No acute fracture or suspicious osseous lesion. Cord: Normal signal and morphology. No abnormal spinal cord enhancement. No epidural collection in the cervical spine. Posterior Fossa, vertebral arteries, paraspinal tissues: Microabscesses in the right paraspinous musculature posterior to the C2-C3 facets, described above. No additional acute finding. Disc levels: C2-C3: Trace anterolisthesis with mild disc bulge. Right-greater-than-left  uncovertebral and facet hypertrophy. No spinal canal stenosis. Mild left and moderate right neural foraminal narrowing, unchanged. C3-C4: No significant disc bulge. Uncovertebral and facet arthropathy. No spinal canal stenosis. Moderate left and mild right neural foraminal narrowing, unchanged. C4-C5: Minimal disc bulge. Uncovertebral and right-greater-than-left facet arthropathy. No spinal canal stenosis. Moderate right neural foraminal narrowing, unchanged. C5-C6: Disc bulge with right paracentral protrusion. Facet and uncovertebral hypertrophy. Mild spinal canal stenosis and moderate bilateral neural foraminal narrowing, unchanged. C6-C7: Mild disc bulge. Facet and uncovertebral hypertrophy. Mild spinal canal stenosis and moderate bilateral neural foraminal narrowing, unchanged C7-T1: No significant disc bulge. No spinal canal stenosis or neuroforaminal narrowing. MRI THORACIC SPINE FINDINGS Alignment: Preservation of the normal thoracic kyphosis. No significant listhesis. Vertebrae: Redemonstrated findings consistent with discitis osteomyelitis at T2-T4 with grossly unchanged distribution of marrow edema and enhancement, but slightly increased intensity. Decreased disc height at T3-T4. Preserved disc height at T2-T3. No new level of involvement. Decreased amount  of ventral epidural phlegmon extending from the superior aspect of T2 through the inferior aspect of T4, which measures up to 3 mm in greatest AP dimension (series 23, image 12), previously 5 mm. Overall unchanged dorsal epidural enhancement is noted from C7-T1 through T5-T6 (series 24, images 9-10) with similar trace dorsal epidural phlegmon/abscess at the level of T2-T3. Decreased spinal canal stenosis at T2-T3, now minimal. No evidence of other affected levels. No suspicious osseous lesions. Cord: Redemonstrated trace syrinx at T4-T5. Otherwise normal in signal and morphology. No abnormal enhancement. Paraspinal and other soft tissues: Redemonstrated paraspinous phlegmon and inflammation about T2, T3, and T4, with redemonstrated microabscesses within it (series 20, image 13). Disc levels: Redemonstrated degenerative changes.  No new or progressive finding. IMPRESSION: 1. Findings consistent with persistent acute septic arthritis involving the right C2-C3 facet which appears overall unchanged from the prior exam. Again noted is adjacent paraspinous soft tissue inflammation with a few small microabscesses. 2. Redemonstrated findings consistent with discitis osteomyelitis at T2-T4, with slightly decreased epidural phlegmon in the ventral epidural space, now measuring up to 3 mm in maximal AP diameter, previously 5 mm, now with minimal spinal canal stenosis at this level. Otherwise unchanged dorsal epidural enhancement and paraspinous phlegmon. Electronically Signed   By: Merilyn Baba M.D.   On: 11/06/2021 00:54   MR CERVICAL SPINE W WO CONTRAST  Result Date: 11/06/2021 CLINICAL DATA:  Discitis-osteomyelitis EXAM: MRI CERVICAL AND THORACIC SPINE WITHOUT AND WITH CONTRAST TECHNIQUE: Multiplanar and multiecho pulse sequences of the cervical spine, to include the craniocervical junction and cervicothoracic junction, and the thoracic spine, were obtained without and with intravenous contrast. CONTRAST:  7.68m  GADAVIST GADOBUTROL 1 MMOL/ML IV SOLN COMPARISON:  10/16/2021 FINDINGS: MRI CERVICAL SPINE FINDINGS Alignment: Unchanged trace anterolisthesis of C2 on C3. Vertebrae: Overall unchanged marrow edema and enhancement about the right C2-C3 facets, consistent with ongoing septic arthritis. Again noted is the associated joint effusion and adjacent soft tissue inflammation and enhancement which also appears unchanged from the prior exam redemonstrated small microabscesses in the right paraspinous musculature posterior to the inflamed right C2-C3 facets (series 12, image 7). Mild epidural enhancement without a discrete collection. No additional findings concerning for discitis osteomyelitis in the cervical spine. No acute fracture or suspicious osseous lesion. Cord: Normal signal and morphology. No abnormal spinal cord enhancement. No epidural collection in the cervical spine. Posterior Fossa, vertebral arteries, paraspinal tissues: Microabscesses in the right paraspinous musculature posterior to the C2-C3 facets, described above. No additional acute finding. Disc levels: C2-C3: Trace anterolisthesis  with mild disc bulge. Right-greater-than-left uncovertebral and facet hypertrophy. No spinal canal stenosis. Mild left and moderate right neural foraminal narrowing, unchanged. C3-C4: No significant disc bulge. Uncovertebral and facet arthropathy. No spinal canal stenosis. Moderate left and mild right neural foraminal narrowing, unchanged. C4-C5: Minimal disc bulge. Uncovertebral and right-greater-than-left facet arthropathy. No spinal canal stenosis. Moderate right neural foraminal narrowing, unchanged. C5-C6: Disc bulge with right paracentral protrusion. Facet and uncovertebral hypertrophy. Mild spinal canal stenosis and moderate bilateral neural foraminal narrowing, unchanged. C6-C7: Mild disc bulge. Facet and uncovertebral hypertrophy. Mild spinal canal stenosis and moderate bilateral neural foraminal narrowing, unchanged  C7-T1: No significant disc bulge. No spinal canal stenosis or neuroforaminal narrowing. MRI THORACIC SPINE FINDINGS Alignment: Preservation of the normal thoracic kyphosis. No significant listhesis. Vertebrae: Redemonstrated findings consistent with discitis osteomyelitis at T2-T4 with grossly unchanged distribution of marrow edema and enhancement, but slightly increased intensity. Decreased disc height at T3-T4. Preserved disc height at T2-T3. No new level of involvement. Decreased amount of ventral epidural phlegmon extending from the superior aspect of T2 through the inferior aspect of T4, which measures up to 3 mm in greatest AP dimension (series 23, image 12), previously 5 mm. Overall unchanged dorsal epidural enhancement is noted from C7-T1 through T5-T6 (series 24, images 9-10) with similar trace dorsal epidural phlegmon/abscess at the level of T2-T3. Decreased spinal canal stenosis at T2-T3, now minimal. No evidence of other affected levels. No suspicious osseous lesions. Cord: Redemonstrated trace syrinx at T4-T5. Otherwise normal in signal and morphology. No abnormal enhancement. Paraspinal and other soft tissues: Redemonstrated paraspinous phlegmon and inflammation about T2, T3, and T4, with redemonstrated microabscesses within it (series 20, image 13). Disc levels: Redemonstrated degenerative changes.  No new or progressive finding. IMPRESSION: 1. Findings consistent with persistent acute septic arthritis involving the right C2-C3 facet which appears overall unchanged from the prior exam. Again noted is adjacent paraspinous soft tissue inflammation with a few small microabscesses. 2. Redemonstrated findings consistent with discitis osteomyelitis at T2-T4, with slightly decreased epidural phlegmon in the ventral epidural space, now measuring up to 3 mm in maximal AP diameter, previously 5 mm, now with minimal spinal canal stenosis at this level. Otherwise unchanged dorsal epidural enhancement and  paraspinous phlegmon. Electronically Signed   By: Merilyn Baba M.D.   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 Vancomycin --> Daptomycin on  10/15/21   not meeting sepsis criteria as of 10/11/21  Echocardiogram showed NO vegetations on TTE, should not need TEE since won't change IV abx course.  Pain control with toradol and oxycodone, MS Contin and newly added Mobic Repeat (+)blood cultures 10/13/21, ID following, have asked neurosurgery to reevaluate (secure chat to [name] 05/29) Repeat MRI with some progression of infection but neurosurgery would like to continue with conservative management. Repeat blood cultures were done 6/1 remain negative in 5 days PICC line was placed on 6/7 -Continue daptomycin-Will need for 6 weeks, end date 11/28/2021 6/21 Repeat MRI due to increase pain, see full report. Overall some improvement, and some findings about the same. Pain mx with MS contin, IV toradol prn, Mobic, norco 6/22 dc IV toradol , risk of AKI and GIB. Discussed this with the patient extensively as he has been receiving this  medication for longer than normal allowed period. Pharmacy also went to discuss this with the patient. Offered pt gabapentin, lyrica...>refused all. Asked for Dilaudid now, and told pt uanble to give him this. He is aleady on good number of regimen. With hx/o drug use, I am concerned about him being on these pain meds   Septic arthritis of cervical spine ([place]) Repeat Mri -see full report Continue daptomycin   History of intravenous drug abuse ([place]) Counseling done during this admission.     Hypokalemia Repleted and resolved   DVT prophylaxis: SCD, walking Code Status: Full Family Communication: None at bedside  Disposition Plan: 11/28/2021 Status is: Inpatient Remains inpatient appropriate because: IV treatment, needs this as inhouse as pt with h/xo IVDU        LOS: 28 days   Time spent: 33 min    Nolberto Hanlon, MD [facility] Pager [phone]  If 7PM-7AM, please contact night-coverage 11/07/2021, 8:50 AM

## 2021-11-08 LAB — C-REACTIVE PROTEIN: CRP: 4.2 mg/dL — ABNORMAL HIGH (ref <1.0)

## 2021-11-08 MED ORDER — OXYCODONE HCL ER 15 MG PO T12A
15.0000 mg | EXTENDED_RELEASE_TABLET | Freq: Two times a day (BID) | ORAL | Status: DC
  Administered 2021-11-08 – 2021-11-18 (×20): 15 mg via ORAL
  Filled 2021-11-08 (×20): qty 1

## 2021-11-08 MED ORDER — OXYCODONE-ACETAMINOPHEN 5-325 MG PO TABS
1.0000 | ORAL_TABLET | Freq: Four times a day (QID) | ORAL | Status: DC | PRN
  Administered 2021-11-08 – 2021-11-18 (×27): 1 via ORAL
  Filled 2021-11-08 (×29): qty 1

## 2021-11-08 MED ORDER — GABAPENTIN 100 MG PO CAPS
100.0000 mg | ORAL_CAPSULE | Freq: Two times a day (BID) | ORAL | Status: DC
Start: 2021-11-08 — End: 2021-11-09
  Administered 2021-11-08 – 2021-11-09 (×3): 100 mg via ORAL
  Filled 2021-11-08 (×4): qty 1

## 2021-11-09 LAB — GLUCOSE, CAPILLARY
Glucose-Capillary: 102 mg/dL — ABNORMAL HIGH (ref 70–99)
Glucose-Capillary: 115 mg/dL — ABNORMAL HIGH (ref 70–99)
Glucose-Capillary: 121 mg/dL — ABNORMAL HIGH (ref 70–99)

## 2021-11-09 MED ORDER — GABAPENTIN 100 MG PO CAPS
100.0000 mg | ORAL_CAPSULE | Freq: Three times a day (TID) | ORAL | Status: DC
Start: 2021-11-09 — End: 2021-11-11
  Administered 2021-11-09 – 2021-11-11 (×6): 100 mg via ORAL
  Filled 2021-11-09 (×6): qty 1

## 2021-11-10 LAB — BASIC METABOLIC PANEL
Anion gap: 7 (ref 5–15)
BUN: 15 mg/dL (ref 6–20)
CO2: 27 mmol/L (ref 22–32)
Calcium: 9.4 mg/dL (ref 8.9–10.3)
Chloride: 102 mmol/L (ref 98–111)
Creatinine, Ser: 0.85 mg/dL (ref 0.61–1.24)
GFR, Estimated: >60 mL/min (ref >60)
Glucose, Bld: 114 mg/dL — ABNORMAL HIGH (ref 70–99)
Potassium: 3.9 mmol/L (ref 3.5–5.1)
Sodium: 136 mmol/L (ref 135–145)

## 2021-11-10 LAB — CBC
HCT: 32.9 % — ABNORMAL LOW (ref 39.0–52.0)
Hemoglobin: 11 g/dL — ABNORMAL LOW (ref 13.0–17.0)
MCH: 29 pg (ref 26.0–34.0)
MCHC: 33.4 g/dL (ref 30.0–36.0)
MCV: 86.8 fL (ref 80.0–100.0)
Platelets: 257 10*3/uL (ref 150–400)
RBC: 3.79 MIL/uL — ABNORMAL LOW (ref 4.22–5.81)
RDW: 12.5 % (ref 11.5–15.5)
WBC: 7.5 10*3/uL (ref 4.0–10.5)
nRBC: 0 % (ref 0.0–0.2)

## 2021-11-11 LAB — CK: Total CK: 102 U/L (ref 49–397)

## 2021-11-11 LAB — MAGNESIUM: Magnesium: 2 mg/dL (ref 1.7–2.4)

## 2021-11-11 MED ORDER — GABAPENTIN 100 MG PO CAPS
200.0000 mg | ORAL_CAPSULE | Freq: Three times a day (TID) | ORAL | Status: DC
  Administered 2021-11-11 – 2021-11-18 (×21): 200 mg via ORAL
  Filled 2021-11-11 (×21): qty 2

## 2021-11-12 ENCOUNTER — Inpatient Hospital Stay: Payer: Self-pay

## 2021-11-12 NOTE — Progress Notes (Signed)
ID Pt moving around Pain better controlled Complains of cough with green sputum No sob Thinks he may have pneumonia  O/e awake and laert Patient Vitals for the past 24 hrs:  BP Temp Pulse Resp SpO2  11/12/21 1548 111/68 98.3 F (36.8 C) 85 18 98 %  11/12/21 1226 110/66 98.2 F (36.8 C) 81 18 98 %  11/12/21 0721 111/70 97.8 F (36.6 C) 82 18 100 %  11/12/21 0447 132/87 98.1 F (36.7 C) 91 -- 100 %  11/11/21 1940 115/71 98.2 F (36.8 C) 83 20 100 %   Chest b/l air entry [id number] Abd soft CNS non focal Left PICC  Labs    Latest Ref Rng & Units 11/10/2021    9:20 AM 11/04/2021    6:58 AM 10/22/2021    6:03 AM  CBC  WBC 4.0 - 10.5 K/uL 7.5  6.2  7.8   Hemoglobin 13.0 - 17.0 g/dL 11.0  11.9  11.1   Hematocrit 39.0 - 52.0 % 32.9  36.3  33.5   Platelets 150 - 400 K/uL 257  252  584        Latest Ref Rng & Units 11/10/2021    9:20 AM 11/07/2021    5:42 AM 11/06/2021    6:30 AM  CMP  Glucose 70 - 99 mg/dL 114     BUN 6 - 20 mg/dL 15     Creatinine 0.61 - 1.24 mg/dL 0.85   0.83   Sodium 135 - 145 mmol/L 136     Potassium 3.5 - 5.1 mmol/L 3.9  4.2    Chloride 98 - 111 mmol/L 102     CO2 22 - 32 mmol/L 27     Calcium 8.9 - 10.3 mg/dL 9.4       Micro 10/10/21 BC MRSA 10/13/21 BC MRSA 10/17/21 BC NG CRP 20>3>1>4.2 ESR 128>71>52>56 CK 45>80>102  Impression/recommendation MRSA bacteremia with T2-T3 osteo/discitis and associated epidural phlegmon, paraspinal phlegmon  C2-C3 facet joint septic arthritis on the rt side Was initially on 2 anti mrsa treatment and now on Dapto Getting 6 weeks of IV in the hospital due to substance use and not able to send him home with PICC, End date 11/28/21.   Cough- because on dapto need to get CXR to look for infiltrate and also cbc with diff to look for eosinophilia  Discusse dthe management with the patient

## 2021-11-13 LAB — CBC WITH DIFFERENTIAL/PLATELET
Abs Immature Granulocytes: 0.16 10*3/uL — ABNORMAL HIGH (ref 0.00–0.07)
Basophils Absolute: 0.1 10*3/uL (ref 0.0–0.1)
Basophils Relative: 1 %
Eosinophils Absolute: 0.4 10*3/uL (ref 0.0–0.5)
Eosinophils Relative: 5 %
HCT: 33.6 % — ABNORMAL LOW (ref 39.0–52.0)
Hemoglobin: 11.2 g/dL — ABNORMAL LOW (ref 13.0–17.0)
Immature Granulocytes: 2 %
Lymphocytes Relative: 31 %
Lymphs Abs: 2.6 10*3/uL (ref 0.7–4.0)
MCH: 29 pg (ref 26.0–34.0)
MCHC: 33.3 g/dL (ref 30.0–36.0)
MCV: 87 fL (ref 80.0–100.0)
Monocytes Absolute: 0.9 10*3/uL (ref 0.1–1.0)
Monocytes Relative: 11 %
Neutro Abs: 4.3 10*3/uL (ref 1.7–7.7)
Neutrophils Relative %: 50 %
Platelets: 325 10*3/uL (ref 150–400)
RBC: 3.86 MIL/uL — ABNORMAL LOW (ref 4.22–5.81)
RDW: 12.7 % (ref 11.5–15.5)
WBC: 8.4 10*3/uL (ref 4.0–10.5)
nRBC: 0 % (ref 0.0–0.2)

## 2021-11-13 LAB — SEDIMENTATION RATE: Sed Rate: 52 mm/hr — ABNORMAL HIGH (ref 0–15)

## 2021-11-13 NOTE — Progress Notes (Signed)
[facility]  - [place] at [facility]   PATIENT NAME: Cameron Hammond    MR#:  [id number]  DATE OF BIRTH:  11-13-1975  SUBJECTIVE:   patient has been admitted with M RSA bacteremia with history of IV drug abuse and has been here since last 34 days. Please review previous detailed progress notes.  Complains of soreness in the neck and back. No fever. Ambulating in the room. Eating well. No family at bedside   VITALS:  Blood pressure 124/86, pulse 86, temperature 98.1 F (36.7 C), resp. rate 17, height 5\' 7"  (1.702 m), weight 74.3 kg, SpO2 100 %.  PHYSICAL EXAMINATION:   GENERAL:  46 y.o.-year-old patient lying in the bed with no acute distress.  LUNGS: Normal breath sounds bilaterally, no wheezing, rales, rhonchi.  CARDIOVASCULAR: S1, S2 normal. No murmurs, rubs, or gallops.  ABDOMEN: Soft, nontender, nondistended. Bowel sounds present.  EXTREMITIES: No  edema b/l.   PICC+ NEUROLOGIC: nonfocal  patient is alert and awake SKIN: No obvious rash, lesion, or ulcer. Skin tattoo +  LABORATORY PANEL:  CBC Recent Labs  Lab 11/13/21 0437  WBC 8.4  HGB 11.2*  HCT 33.6*  PLT 325    Chemistries  Recent Labs  Lab 11/10/21 0920 11/11/21 0630  NA 136  --   K 3.9  --   CL 102  --   CO2 27  --   GLUCOSE 114*  --   BUN 15  --   CREATININE 0.85  --   CALCIUM 9.4  --   MG  --  2.0   Cardiac Enzymes No results for input(s): "TROPONINI" in the last 168 hours. RADIOLOGY:  DG Chest 2 View  Result Date: 11/12/2021 CLINICAL DATA:  Discitis and osteomyelitis. EXAM: CHEST - 2 VIEW COMPARISON:  Thoracic spine MRI 11/05/2021 FINDINGS: Left upper extremity PICC tip in the SVC.The cardiomediastinal contours are normal. Minor streaky retrocardiac atelectasis at the left lung base. Pulmonary vasculature is normal. No consolidation, pleural effusion, or pneumothorax. No cavitary lesions are evident. The known upper thoracic spinal infection is not well demonstrated on the current  exam. IMPRESSION: Streaky retrocardiac atelectasis. Electronically Signed   By: 11/07/2021 M.D.   On: 11/12/2021 18:43    Assessment and Plan Cameron Hammond is a 46 year old male PMH back pain, IVDU last use 6 months prior to admission (varying history on this).  Presented to ED 10/10/21 complaining of pain in head, neck, upper back x1 week, no relief with chiropractor.  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 -- Continue IV Daptomycin thru 11/28/21-- completed six week course -- TTE negative -- continue current pain management. -- Given history of IV drug abuse will not be using IV pain meds at this stage of illness. -- Continue gabapentin  history of IV drug abuse -- patient understand seriousness of his illness -- counsel cessation    Procedures: PICC line Family communication : none Consults : neurosurgery, IR, ID CODE STATUS: full DVT Prophylaxis : ambulatory Level of care: Med-Surg Status is: Inpatient Remains inpatient appropriate because: remains in the hospital to complete IV antibiotics given history of drug abuse    TOTAL TIME TAKING CARE OF THIS PATIENT: 25 minutes.  >50% time spent on counselling and coordination of care  Note: This dictation was prepared with Dragon dictation along with smaller phrase technology. Any transcriptional errors that result  from this process are unintentional.  Enedina Finner M.D    [facility]   CC: Primary care physician; Patient, No Pcp Per

## 2021-11-13 NOTE — TOC Progression Note (Signed)
Transition of Care [facility]) - Progression Note    Patient Details  Name: Cameron Hammond MRN: [id number] Date of Birth: 02-08-1976  Transition of Care [facility]) CM/SW Contact  Durwin Glaze, LCSW Phone Number: 11/13/2021, 7:16 AM  Clinical Narrative:    CSW reviewed patient's chart. Per MD, patient will remain in the hospital until mid July. There are no identified TOC needs at this time.    Expected Discharge Plan: Home/Self Care Barriers to Discharge: Continued Medical Work up  Expected Discharge Plan and Services Expected Discharge Plan: Home/Self Care                                               Social Determinants of Health (SDOH) Interventions    Readmission Risk Interventions     No data to display

## 2021-11-14 ENCOUNTER — Inpatient Hospital Stay: Payer: Self-pay | Admitting: Infectious Diseases

## 2021-11-14 NOTE — TOC Progression Note (Signed)
Transition of Care [facility]) - Progression Note    Patient Details  Name: Cameron Hammond MRN: [id number] Date of Birth: 08/08/1975  Transition of Care [facility]) CM/SW Contact  Durwin Glaze, LCSW Phone Number: 11/14/2021, 1:52 PM  Clinical Narrative:    CSW reviewed pt's chart, there are no TOC needs at this time.   Expected Discharge Plan: Home/Self Care Barriers to Discharge: Continued Medical Work up  Expected Discharge Plan and Services Expected Discharge Plan: Home/Self Care                                               Social Determinants of Health (SDOH) Interventions    Readmission Risk Interventions     No data to display

## 2021-11-14 NOTE — Progress Notes (Signed)
VAST consult received to assess PICC which is causing pain in shoulder and chest. Pt with PICC in his left basilic that he has had since 6/6; complaining of burning pain in his upper arm into his armpit which is relieved slightly when he holds his arm a certain way. He claims it is excruciating and he is ready to pull the line out himself. He also stated he has had tingling in his left hand and earlier his left hand was ice cold while his right hand was fine.  PICC was placed for 6 weeks Daptomycin IV in the hospital due to substance use and not able to send him home with PICC, End date 11/28/21.  Due to similarities between Dapto and Vanc, PICC RN suggested best practice would be to place PICC in right arm or place a tunneled line for remainder of therapy.  Ravishankar, MD does not want to place another central line or PICC at this time and has ordered a PIV. PICC to be removed per MD order.

## 2021-11-14 NOTE — Progress Notes (Signed)
[facility]  - [place] at [facility]   PATIENT NAME: Cameron Hammond    MR#:  [id number]  DATE OF BIRTH:  Jan 02, 1976  SUBJECTIVE:   patient has been admitted with M RSA bacteremia with history of IV drug abuse and has been here since last several weeks Please review previous detailed progress notes.  Complains of soreness in the neck and back. No fever. Ambulating in the room. Eating well. No family at bedside PICC line removed due to pain   VITALS:  Blood pressure 134/83, pulse 99, temperature 97.9 F (36.6 C), resp. rate 18, height 5\' 7"  (1.702 m), weight 74.3 kg, SpO2 98 %.  PHYSICAL EXAMINATION:   GENERAL:  46 y.o.-year-old patient lying in the bed with no acute distress.  LUNGS: Normal breath sounds bilaterally, no wheezing, rales, rhonchi.  CARDIOVASCULAR: S1, S2 normal. No murmurs, rubs, or gallops.  ABDOMEN: Soft, nontender, nondistended. Bowel sounds present.  EXTREMITIES: No  edema b/l.   NEUROLOGIC: nonfocal  patient is alert and awake SKIN: No obvious rash, lesion, or ulcer. Skin tattoo +  LABORATORY PANEL:  CBC Recent Labs  Lab 11/13/21 0437  WBC 8.4  HGB 11.2*  HCT 33.6*  PLT 325     Chemistries  Recent Labs  Lab 11/10/21 0920 11/11/21 0630  NA 136  --   K 3.9  --   CL 102  --   CO2 27  --   GLUCOSE 114*  --   BUN 15  --   CREATININE 0.85  --   CALCIUM 9.4  --   MG  --  2.0    Cardiac Enzymes No results for input(s): "TROPONINI" in the last 168 hours. RADIOLOGY:  DG Chest 2 View  Result Date: 11/12/2021 CLINICAL DATA:  Discitis and osteomyelitis. EXAM: CHEST - 2 VIEW COMPARISON:  Thoracic spine MRI 11/05/2021 FINDINGS: Left upper extremity PICC tip in the SVC.The cardiomediastinal contours are normal. Minor streaky retrocardiac atelectasis at the left lung base. Pulmonary vasculature is normal. No consolidation, pleural effusion, or pneumothorax. No cavitary lesions are evident. The known upper thoracic spinal infection is not  well demonstrated on the current exam. IMPRESSION: Streaky retrocardiac atelectasis. Electronically Signed   By: 11/07/2021 M.D.   On: 11/12/2021 18:43    Assessment and Plan Mr. Bob is a 46 year old male PMH back pain, IVDU last use 6 months prior to admission (varying history on this).  Presented to ED 10/10/21 complaining of pain in head, neck, upper back x1 week, no relief with chiropractor.  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 -- Continue IV Daptomycin thru 11/28/21-- completed six week course -- TTE negative -- continue current pain management. -- Given history of IV drug abuse will not be using IV pain meds at this stage of illness. -- Continue gabapentin --removed PICC, give abx thru peripheral line --cxr neg PNA  history of IV drug abuse -- patient understand seriousness of his illness -- counsel cessation     Family communication : none Consults : neurosurgery, IID CODE STATUS: full DVT Prophylaxis : ambulatory Level of care: Med-Surg Status is: Inpatient Remains inpatient appropriate because: remains in the hospital to complete IV antibiotics given history of drug abuse   EDD 11/28/2021 TOTAL TIME TAKING CARE OF THIS PATIENT: 25 minutes.  >50% time spent on counselling and coordination of care  Note: This dictation  was prepared with Dragon dictation along with smaller phrase technology. Any transcriptional errors that result from this process are unintentional.  Enedina Finner M.D    [facility]   CC: Primary care physician; Patient, No Pcp Per

## 2021-11-15 ENCOUNTER — Other Ambulatory Visit: Payer: Self-pay

## 2021-11-15 DIAGNOSIS — M0088 Arthritis due to other bacteria, vertebrae: Secondary | ICD-10-CM

## 2021-11-15 LAB — GLUCOSE, CAPILLARY: Glucose-Capillary: 100 mg/dL — ABNORMAL HIGH (ref 70–99)

## 2021-11-15 NOTE — Progress Notes (Signed)
Patient complaining of chest pain, Vital signs taken, MD notified, ordered EKG.

## 2021-11-15 NOTE — Progress Notes (Signed)
ID Patient states he has pain in his left arm.  He initially thought it was due to PICC line got removed yesterday. The pain is going down his arm.  He also has pain on the left side of the chest  O/e awake and laert Patient Vitals for the past 24 hrs:  BP Temp Pulse Resp SpO2  11/15/21 0721 (!) 136/96 97.6 F (36.4 C) 97 16 100 %  11/15/21 0440 (!) 140/95 98 F (36.7 C) 98 (!) 21 98 %  11/14/21 1950 129/84 98 F (36.7 C) 86 18 98 %  11/14/21 1713 136/87 98 F (36.7 C) 77 18 98 %   Chest b/l air entry [id number] Abd soft CNS non focal   Labs    Latest Ref Rng & Units 11/13/2021    4:37 AM 11/10/2021    9:20 AM 11/04/2021    6:58 AM  CBC  WBC 4.0 - 10.5 K/uL 8.4  7.5  6.2   Hemoglobin 13.0 - 17.0 g/dL 11.2  11.0  11.9   Hematocrit 39.0 - 52.0 % 33.6  32.9  36.3   Platelets 150 - 400 K/uL 325  257  252        Latest Ref Rng & Units 11/10/2021    9:20 AM 11/07/2021    5:42 AM 11/06/2021    6:30 AM  CMP  Glucose 70 - 99 mg/dL 114     BUN 6 - 20 mg/dL 15     Creatinine 0.61 - 1.24 mg/dL 0.85   0.83   Sodium 135 - 145 mmol/L 136     Potassium 3.5 - 5.1 mmol/L 3.9  4.2    Chloride 98 - 111 mmol/L 102     CO2 22 - 32 mmol/L 27     Calcium 8.9 - 10.3 mg/dL 9.4       Micro 10/10/21 BC MRSA 10/13/21 BC MRSA 10/17/21 BC NG CRP 20>3>1>4.2 ESR 128>71>52>56 CK 45>80>102    Impression/recommendation MRSA bacteremia with T2-T3 osteo/discitis and associated epidural phlegmon, paraspinal phlegmon  C2-C3 facet joint septic arthritis on the rt side Was initially on 2 anti mrsa treatment and now on Dapto Getting 6 weeks of IV in the hospital due to substance use and not able to send him home with PICC, End date 11/28/21.  We will check ESR and CRP on Monday.  Pain left arm and left side of the chest could be nerve pain. Cough-cxr no infiltrate, normal eosinophil count  Discussed the management with the patient ID will follow him peripherally this weekend.  Call if needed.

## 2021-11-15 NOTE — Plan of Care (Signed)

## 2021-11-15 NOTE — Progress Notes (Signed)
[facility]  - [place] at [facility]   PATIENT NAME: Cameron Hammond    MR#:  [id number]  DATE OF BIRTH:  27-Apr-1976  SUBJECTIVE:   patient has been admitted with M RSA bacteremia with history of IV drug abuse and has been here since last several weeks Please review previous detailed progress notes.  Ambulating in hallways   VITALS:  Blood pressure (!) 136/96, pulse 97, temperature 97.6 F (36.4 C), resp. rate 16, height 5\' 7"  (1.702 m), weight 74.3 kg, SpO2 100 %.  PHYSICAL EXAMINATION:   GENERAL:  46 y.o.-year-old patient lying in the bed with no acute distress.  LUNGS: Normal breath sounds bilaterally CARDIOVASCULAR: S1, S2 normal.  ABDOMEN: Soft, nontender, nondistended. EXTREMITIES: No  edema b/l.   NEUROLOGIC: nonfocal  patient is alert and awake SKIN: No obvious rash, lesion, or ulcer. Skin tattoo +  LABORATORY PANEL:  CBC Recent Labs  Lab 11/13/21 0437  WBC 8.4  HGB 11.2*  HCT 33.6*  PLT 325     Chemistries  Recent Labs  Lab 11/10/21 0920 11/11/21 0630  NA 136  --   K 3.9  --   CL 102  --   CO2 27  --   GLUCOSE 114*  --   BUN 15  --   CREATININE 0.85  --   CALCIUM 9.4  --   MG  --  2.0    Cardiac Enzymes No results for input(s): "TROPONINI" in the last 168 hours. RADIOLOGY:  No results found.  Assessment and Plan Cameron Hammond is a 46 year old male PMH back pain, IVDU last use 6 months prior to admission (varying history on this).  Presented to ED 10/10/21 complaining of pain in head, neck, upper back x1 week, no relief with chiropractor.  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 -- Continue IV Daptomycin thru 11/28/21-- completed six week course -- TTE negative -- continue current pain management. -- Given history of IV drug abuse will not be using IV pain meds at this stage of  illness. -- Continue gabapentin --removed PICC, give abx thru peripheral line --cxr neg PNA  history of IV drug abuse -- patient understand seriousness of his illness -- counsel cessation     Family communication : none Consults : neurosurgery, ID CODE STATUS: full DVT Prophylaxis : ambulatory Level of care: Med-Surg Status is: Inpatient Remains inpatient appropriate because: remains in the hospital to complete IV antibiotics given history of drug abuse   EDD 11/28/2021 TOTAL TIME TAKING CARE OF THIS PATIENT: 25 minutes.  >50% time spent on counselling and coordination of care  Note: This dictation was prepared with Dragon dictation along with smaller phrase technology. Any transcriptional errors that result from this process are unintentional.  11/30/2021 M.D    [facility]   CC: Primary care physician; Patient, No Pcp Per

## 2021-11-16 NOTE — Progress Notes (Signed)
[facility]  - [place] at [facility]   PATIENT NAME: Cameron Hammond    MR#:  [id number]  DATE OF BIRTH:  23-Oct-1975  SUBJECTIVE:   patient has been admitted with M RSA bacteremia with history of IV drug abuse and has been here since last several weeks Please review previous detailed progress notes.  Ambulating in hallways Neck pain radiating to arm   VITALS:  Blood pressure 111/74, pulse 83, temperature 97.9 F (36.6 C), resp. rate 16, height 5\' 7"  (1.702 m), weight 74.3 kg, SpO2 100 %.  PHYSICAL EXAMINATION:   GENERAL:  46 y.o.-year-old patient lying in the bed with no acute distress.  LUNGS: Normal breath sounds bilaterally CARDIOVASCULAR: S1, S2 normal.  ABDOMEN: Soft, nontender, nondistended. EXTREMITIES: No  edema b/l.   NEUROLOGIC: nonfocal  patient is alert and awake SKIN: No obvious rash, lesion, or ulcer. Skin tattoo +  LABORATORY PANEL:  CBC Recent Labs  Lab 11/13/21 0437  WBC 8.4  HGB 11.2*  HCT 33.6*  PLT 325     Chemistries  Recent Labs  Lab 11/10/21 0920 11/11/21 0630  NA 136  --   K 3.9  --   CL 102  --   CO2 27  --   GLUCOSE 114*  --   BUN 15  --   CREATININE 0.85  --   CALCIUM 9.4  --   MG  --  2.0    Cardiac Enzymes No results for input(s): "TROPONINI" in the last 168 hours. RADIOLOGY:  No results found.  Assessment and Plan Cameron Hammond is a 46 year old male PMH back pain, IVDU last use 6 months prior to admission (varying history on this).  Presented to ED 10/10/21 complaining of pain in head, neck, upper back x1 week, no relief with chiropractor.  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 -- Continue IV Daptomycin thru 11/28/21-- completed six week course -- TTE negative -- continue current pain management. -- Given history of IV drug abuse will not be using IV pain  meds at this stage of illness. -- Continue gabapentin --removed PICC, give abx thru peripheral line --cxr neg PNA  history of IV drug abuse -- patient understand seriousness of his illness -- counsel cessation     Family communication : none Consults : neurosurgery, ID CODE STATUS: full DVT Prophylaxis : ambulatory Level of care: Med-Surg Status is: Inpatient Remains inpatient appropriate because: remains in the hospital to complete IV antibiotics given history of drug abuse   EDD 11/28/2021 TOTAL TIME TAKING CARE OF THIS PATIENT: 25 minutes.  >50% time spent on counselling and coordination of care  Note: This dictation was prepared with Dragon dictation along with smaller phrase technology. Any transcriptional errors that result from this process are unintentional.  11/30/2021 M.D    [facility]   CC: Primary care physician; Patient, No Pcp Per

## 2021-11-17 NOTE — Progress Notes (Signed)
[facility]  - [place] at [facility]   PATIENT NAME: Cameron Hammond    MR#:  [id number]  DATE OF BIRTH:  27-Jun-1975  SUBJECTIVE:   patient has been admitted with M RSA bacteremia with history of IV drug abuse and has been here since last several weeks Please review previous detailed progress notes.  Neck pain radiating to left upper arm   VITALS:  Blood pressure 107/88, pulse 87, temperature 98.4 F (36.9 C), resp. rate 18, height 5\' 7"  (1.702 m), weight 74.3 kg, SpO2 98 %.  PHYSICAL EXAMINATION:   GENERAL:  46 y.o.-year-old patient lying in the bed with no acute distress.  LUNGS: Normal breath sounds bilaterally CARDIOVASCULAR: S1, S2 normal.  ABDOMEN: Soft, nontender, nondistended. EXTREMITIES: No  edema b/l.   NEUROLOGIC: nonfocal  patient is alert and awake SKIN: No obvious rash, lesion, or ulcer. Skin tattoo +  LABORATORY PANEL:  CBC Recent Labs  Lab 11/13/21 0437  WBC 8.4  HGB 11.2*  HCT 33.6*  PLT 325     Chemistries  Recent Labs  Lab 11/11/21 0630  MG 2.0    Cardiac Enzymes No results for input(s): "TROPONINI" in the last 168 hours. RADIOLOGY:  No results found.  Assessment and Plan [name] is a 46 year old male PMH back pain, IVDU last use 6 months prior to admission (varying history on this).  Presented to ED 10/10/21 complaining of pain in head, neck, upper back x1 week, no relief with chiropractor.  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 -- Continue IV Daptomycin thru 11/28/21-- completed six week course -- TTE negative -- continue current pain management. -- Given history of IV drug abuse will not be using IV pain meds at this stage of illness. -- Continue gabapentin --removed PICC, give abx thru peripheral line --cxr neg PNA  history of IV drug abuse -- patient  understand seriousness of his illness -- counsel cessation  Pt is expressing his desire to go home. Will d/w ID if he can change to po abxs for remainder of the course    Family communication : none Consults : neurosurgery, ID CODE STATUS: full DVT Prophylaxis : ambulatory Level of care: Med-Surg Status is: Inpatient Remains inpatient appropriate because: remains in the hospital to complete IV antibiotics given history of drug abuse   EDD 11/28/2021 TOTAL TIME TAKING CARE OF THIS PATIENT: 25 minutes.  >50% time spent on counselling and coordination of care  Note: This dictation was prepared with Dragon dictation along with smaller phrase technology. Any transcriptional errors that result from this process are unintentional.  11/30/2021 M.D    [facility]   CC: Primary care physician; Patient, No Pcp Per

## 2021-11-17 NOTE — Plan of Care (Signed)

## 2021-11-18 ENCOUNTER — Encounter: Payer: Self-pay | Admitting: Infectious Diseases

## 2021-11-18 ENCOUNTER — Other Ambulatory Visit: Payer: Self-pay

## 2021-11-18 LAB — SEDIMENTATION RATE: Sed Rate: 49 mm/hr — ABNORMAL HIGH (ref 0–15)

## 2021-11-18 LAB — CK: Total CK: 129 U/L (ref 49–397)

## 2021-11-18 LAB — C-REACTIVE PROTEIN: CRP: 1.7 mg/dL — ABNORMAL HIGH (ref <1.0)

## 2021-11-18 MED ORDER — OXYCODONE HCL ER 15 MG PO T12A: 15.0000 mg | EXTENDED_RELEASE_TABLET | Freq: Two times a day (BID) | ORAL | 0 refills | Status: DC

## 2021-11-18 MED ORDER — MELOXICAM 15 MG PO TABS: 15.0000 mg | ORAL_TABLET | Freq: Every day | ORAL | 0 refills | Status: DC

## 2021-11-18 MED ORDER — LINEZOLID 600 MG PO TABS: 600.0000 mg | ORAL_TABLET | Freq: Two times a day (BID) | ORAL | Status: DC

## 2021-11-18 MED ORDER — OXYCODONE HCL ER 15 MG PO T12A
15.0000 mg | EXTENDED_RELEASE_TABLET | Freq: Two times a day (BID) | ORAL | 0 refills | Status: AC
Start: 2021-11-18 — End: ?

## 2021-11-18 MED ORDER — OXYCODONE-ACETAMINOPHEN 5-325 MG PO TABS
1.0000 | ORAL_TABLET | Freq: Three times a day (TID) | ORAL | 0 refills | Status: AC | PRN
Start: 2021-11-18 — End: ?
  Filled 2021-11-18: qty 20, 7d supply, fill #0

## 2021-11-18 MED ORDER — CYCLOBENZAPRINE HCL 10 MG PO TABS
10.0000 mg | ORAL_TABLET | Freq: Three times a day (TID) | ORAL | 0 refills | Status: AC | PRN
  Filled 2021-11-18: qty 30, 10d supply, fill #0

## 2021-11-18 MED ORDER — OXYCODONE-ACETAMINOPHEN 5-325 MG PO TABS
1.0000 | ORAL_TABLET | Freq: Three times a day (TID) | ORAL | 0 refills | Status: DC | PRN
Start: 2021-11-18 — End: 2021-11-18

## 2021-11-18 MED ORDER — MELOXICAM 15 MG PO TABS
15.0000 mg | ORAL_TABLET | Freq: Every day | ORAL | 0 refills | Status: AC
  Filled 2021-11-18: qty 30, 30d supply, fill #0

## 2021-11-18 MED ORDER — GABAPENTIN 100 MG PO CAPS: 200.0000 mg | ORAL_CAPSULE | Freq: Three times a day (TID) | ORAL | 0 refills | Status: DC

## 2021-11-18 MED ORDER — GABAPENTIN 100 MG PO CAPS
200.0000 mg | ORAL_CAPSULE | Freq: Three times a day (TID) | ORAL | 0 refills | Status: AC
  Filled 2021-11-18: qty 60, 10d supply, fill #0

## 2021-11-18 MED ORDER — CYCLOBENZAPRINE HCL 10 MG PO TABS
10.0000 mg | ORAL_TABLET | Freq: Three times a day (TID) | ORAL | 0 refills | Status: DC | PRN
Start: 2021-11-18 — End: 2021-11-18

## 2021-11-18 MED ORDER — LINEZOLID 600 MG PO TABS
600.0000 mg | ORAL_TABLET | Freq: Two times a day (BID) | ORAL | 0 refills | Status: DC
  Filled 2021-11-18: qty 12, 6d supply, fill #0
  Filled 2021-11-26: qty 18, 9d supply, fill #1

## 2021-11-18 NOTE — Progress Notes (Signed)
Discharge instructions reviewed with pt. He verbalized understanding of instructions. He was instructed to make his follow-up appts. He is waiting on pharmacy to bring his medication to his room

## 2021-11-18 NOTE — Discharge Summary (Addendum)
Physician Discharge Summary   Patient: Cameron Hammond MRN: [id number] DOB: 21-Sep-1975  Admit date:     10/10/2021  Discharge date: 11/18/21  Discharge Physician: Enedina Finner   PCP: Patient, No Pcp Per   Recommendations at discharge:   [facility] and establish primary care follow-up Dr. Marcell Barlow neurosurgery and 1 to 2 weeks follow-up infectious disease Dr. 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 Senn is a 46 year old male PMH back pain, IVDU last use 6 months prior to admission (varying history on this).  Presented to ED 10/10/21 complaining of pain in head, neck, upper back x1 week, no relief with chiropractor.   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 -- Continue IV Daptomycin -- change to PO linezolid for 15 more days per ID recommendation -- TTE negative -- cont oral pain meds as rxed -- Continue gabapentin, MOBIC, Flexeril   history of IV drug abuse -- patient understand seriousness of his illness -- counsel cessation   patient will discharged to home with meds given through employee pharmacy      Consultants: ID, neurosurgery Procedures performed: PICC line placed and removed Disposition: Home Diet recommendation:  Regular diet DISCHARGE MEDICATION: Allergies as of 11/18/2021   No Known Allergies      Medication List     STOP taking these medications    albuterol 108 (90 Base) MCG/ACT inhaler Commonly known as: VENTOLIN HFA   esomeprazole 40 MG capsule Commonly known as: NEXIUM   hydrOXYzine 50 MG tablet Commonly known as: ATARAX   ibuprofen 800 MG  tablet Commonly known as: ADVIL   multivitamin with minerals Tabs tablet   ondansetron 4 MG disintegrating tablet Commonly known as: Zofran ODT       TAKE these medications    cyclobenzaprine 10 MG tablet Commonly known as: FLEXERIL Take 1 tablet (10 mg total) by mouth 3 (three) times daily as needed for muscle spasms.   gabapentin 100 MG capsule Commonly known as: NEURONTIN Take 2 capsules (200 mg total) by mouth 3 (three) times daily.   linezolid 600 MG tablet Commonly known as: ZYVOX Take 1 tablet (600 mg total) by mouth every 12 (twelve) hours for 15 days.   meloxicam 15 MG tablet Commonly known as: MOBIC Take 1 tablet (15 mg total) by mouth daily. Start taking on: November 19, 2021   oxyCODONE 15 mg 12 hr tablet Commonly known as: OXYCONTIN Take 1 tablet (15 mg total) by mouth every 12 (twelve) hours.   oxyCODONE-acetaminophen 5-325 MG tablet Commonly known as: PERCOCET/ROXICET Take 1 tablet by mouth every 8 (eight) hours as needed for severe pain or moderate pain (breakthrough pain).        Follow-up Information     [facility]. Go to.   Specialty: Primary Care Why: pt advised to go to establish pcp Contact information: [address] [place] [address] [phone]        Lynn Ito, MD. Go on 12/03/2021.   Specialty: Infectious Diseases Why: cervical spine discitis; Appt @ 11:45 am Contact information: [address] [place] [address] [phone]         Myer Haff,  Annia Friendly, MD. Schedule an appointment as soon as possible for a visit in 10 day(s).   Specialty: Neurosurgery Why: cervical spine discitis;   Patient should call for Appt Contact information: [address] [place] [address] [phone]                Discharge Exam: Ceasar Mons Weights   10/10/21 1152 10/20/21 1202  Weight: 79.4 kg 74.3 kg     Condition at discharge: fair  The results of  significant diagnostics from this hospitalization (including imaging, microbiology, ancillary and laboratory) are listed below for reference.   Imaging Studies: DG Chest 2 View  Result Date: 11/12/2021 CLINICAL DATA:  Discitis and osteomyelitis. EXAM: CHEST - 2 VIEW COMPARISON:  Thoracic spine MRI 11/05/2021 FINDINGS: Left upper extremity PICC tip in the SVC.The cardiomediastinal contours are normal. Minor streaky retrocardiac atelectasis at the left lung base. Pulmonary vasculature is normal. No consolidation, pleural effusion, or pneumothorax. No cavitary lesions are evident. The known upper thoracic spinal infection is not well demonstrated on the current exam. IMPRESSION: Streaky retrocardiac atelectasis. Electronically Signed   By: Narda Rutherford M.D.   On: 11/12/2021 18:43   MR THORACIC SPINE W WO CONTRAST  Result Date: 11/06/2021 CLINICAL DATA:  Discitis-osteomyelitis EXAM: MRI CERVICAL AND THORACIC SPINE WITHOUT AND WITH CONTRAST TECHNIQUE: Multiplanar and multiecho pulse sequences of the cervical spine, to include the craniocervical junction and cervicothoracic junction, and the thoracic spine, were obtained without and with intravenous contrast. CONTRAST:  7.25mL GADAVIST GADOBUTROL 1 MMOL/ML IV SOLN COMPARISON:  10/16/2021 FINDINGS: MRI CERVICAL SPINE FINDINGS Alignment: Unchanged trace anterolisthesis of C2 on C3. Vertebrae: Overall unchanged marrow edema and enhancement about the right C2-C3 facets, consistent with ongoing septic arthritis. Again noted is the associated joint effusion and adjacent soft tissue inflammation and enhancement which also appears unchanged from the prior exam redemonstrated small microabscesses in the right paraspinous musculature posterior to the inflamed right C2-C3 facets (series 12, image 7). Mild epidural enhancement without a discrete collection. No additional findings concerning for discitis osteomyelitis in the cervical spine. No acute fracture or suspicious  osseous lesion. Cord: Normal signal and morphology. No abnormal spinal cord enhancement. No epidural collection in the cervical spine. Posterior Fossa, vertebral arteries, paraspinal tissues: Microabscesses in the right paraspinous musculature posterior to the C2-C3 facets, described above. No additional acute finding. Disc levels: C2-C3: Trace anterolisthesis with mild disc bulge. Right-greater-than-left uncovertebral and facet hypertrophy. No spinal canal stenosis. Mild left and moderate right neural foraminal narrowing, unchanged. C3-C4: No significant disc bulge. Uncovertebral and facet arthropathy. No spinal canal stenosis. Moderate left and mild right neural foraminal narrowing, unchanged. C4-C5: Minimal disc bulge. Uncovertebral and right-greater-than-left facet arthropathy. No spinal canal stenosis. Moderate right neural foraminal narrowing, unchanged. C5-C6: Disc bulge with right paracentral protrusion. Facet and uncovertebral hypertrophy. Mild spinal canal stenosis and moderate bilateral neural foraminal narrowing, unchanged. C6-C7: Mild disc bulge. Facet and uncovertebral hypertrophy. Mild spinal canal stenosis and moderate bilateral neural foraminal narrowing, unchanged C7-T1: No significant disc bulge. No spinal canal stenosis or neuroforaminal narrowing. MRI THORACIC SPINE FINDINGS Alignment: Preservation of the normal thoracic kyphosis. No significant listhesis. Vertebrae: Redemonstrated findings consistent with discitis osteomyelitis at T2-T4 with grossly unchanged distribution of marrow edema and enhancement, but slightly increased intensity. Decreased disc height at T3-T4. Preserved disc height at T2-T3. No new level of involvement. Decreased amount of ventral epidural phlegmon extending from the superior aspect of T2 through the inferior aspect of T4, which measures up to 3 mm  in greatest AP dimension (series 23, image 12), previously 5 mm. Overall unchanged dorsal epidural enhancement is noted  from C7-T1 through T5-T6 (series 24, images 9-10) with similar trace dorsal epidural phlegmon/abscess at the level of T2-T3. Decreased spinal canal stenosis at T2-T3, now minimal. No evidence of other affected levels. No suspicious osseous lesions. Cord: Redemonstrated trace syrinx at T4-T5. Otherwise normal in signal and morphology. No abnormal enhancement. Paraspinal and other soft tissues: Redemonstrated paraspinous phlegmon and inflammation about T2, T3, and T4, with redemonstrated microabscesses within it (series 20, image 13). Disc levels: Redemonstrated degenerative changes.  No new or progressive finding. IMPRESSION: 1. Findings consistent with persistent acute septic arthritis involving the right C2-C3 facet which appears overall unchanged from the prior exam. Again noted is adjacent paraspinous soft tissue inflammation with a few small microabscesses. 2. Redemonstrated findings consistent with discitis osteomyelitis at T2-T4, with slightly decreased epidural phlegmon in the ventral epidural space, now measuring up to 3 mm in maximal AP diameter, previously 5 mm, now with minimal spinal canal stenosis at this level. Otherwise unchanged dorsal epidural enhancement and paraspinous phlegmon. Electronically Signed   By: Wiliam Ke M.D.   On: 11/06/2021 00:54   MR CERVICAL SPINE W WO CONTRAST  Result Date: 11/06/2021 CLINICAL DATA:  Discitis-osteomyelitis EXAM: MRI CERVICAL AND THORACIC SPINE WITHOUT AND WITH CONTRAST TECHNIQUE: Multiplanar and multiecho pulse sequences of the cervical spine, to include the craniocervical junction and cervicothoracic junction, and the thoracic spine, were obtained without and with intravenous contrast. CONTRAST:  7.57mL GADAVIST GADOBUTROL 1 MMOL/ML IV SOLN COMPARISON:  10/16/2021 FINDINGS: MRI CERVICAL SPINE FINDINGS Alignment: Unchanged trace anterolisthesis of C2 on C3. Vertebrae: Overall unchanged marrow edema and enhancement about the right C2-C3 facets, consistent  with ongoing septic arthritis. Again noted is the associated joint effusion and adjacent soft tissue inflammation and enhancement which also appears unchanged from the prior exam redemonstrated small microabscesses in the right paraspinous musculature posterior to the inflamed right C2-C3 facets (series 12, image 7). Mild epidural enhancement without a discrete collection. No additional findings concerning for discitis osteomyelitis in the cervical spine. No acute fracture or suspicious osseous lesion. Cord: Normal signal and morphology. No abnormal spinal cord enhancement. No epidural collection in the cervical spine. Posterior Fossa, vertebral arteries, paraspinal tissues: Microabscesses in the right paraspinous musculature posterior to the C2-C3 facets, described above. No additional acute finding. Disc levels: C2-C3: Trace anterolisthesis with mild disc bulge. Right-greater-than-left uncovertebral and facet hypertrophy. No spinal canal stenosis. Mild left and moderate right neural foraminal narrowing, unchanged. C3-C4: No significant disc bulge. Uncovertebral and facet arthropathy. No spinal canal stenosis. Moderate left and mild right neural foraminal narrowing, unchanged. C4-C5: Minimal disc bulge. Uncovertebral and right-greater-than-left facet arthropathy. No spinal canal stenosis. Moderate right neural foraminal narrowing, unchanged. C5-C6: Disc bulge with right paracentral protrusion. Facet and uncovertebral hypertrophy. Mild spinal canal stenosis and moderate bilateral neural foraminal narrowing, unchanged. C6-C7: Mild disc bulge. Facet and uncovertebral hypertrophy. Mild spinal canal stenosis and moderate bilateral neural foraminal narrowing, unchanged C7-T1: No significant disc bulge. No spinal canal stenosis or neuroforaminal narrowing. MRI THORACIC SPINE FINDINGS Alignment: Preservation of the normal thoracic kyphosis. No significant listhesis. Vertebrae: Redemonstrated findings consistent with  discitis osteomyelitis at T2-T4 with grossly unchanged distribution of marrow edema and enhancement, but slightly increased intensity. Decreased disc height at T3-T4. Preserved disc height at T2-T3. No new level of involvement. Decreased amount of ventral epidural phlegmon extending from the superior aspect of T2 through the inferior aspect of T4,  which measures up to 3 mm in greatest AP dimension (series 23, image 12), previously 5 mm. Overall unchanged dorsal epidural enhancement is noted from C7-T1 through T5-T6 (series 24, images 9-10) with similar trace dorsal epidural phlegmon/abscess at the level of T2-T3. Decreased spinal canal stenosis at T2-T3, now minimal. No evidence of other affected levels. No suspicious osseous lesions. Cord: Redemonstrated trace syrinx at T4-T5. Otherwise normal in signal and morphology. No abnormal enhancement. Paraspinal and other soft tissues: Redemonstrated paraspinous phlegmon and inflammation about T2, T3, and T4, with redemonstrated microabscesses within it (series 20, image 13). Disc levels: Redemonstrated degenerative changes.  No new or progressive finding. IMPRESSION: 1. Findings consistent with persistent acute septic arthritis involving the right C2-C3 facet which appears overall unchanged from the prior exam. Again noted is adjacent paraspinous soft tissue inflammation with a few small microabscesses. 2. Redemonstrated findings consistent with discitis osteomyelitis at T2-T4, with slightly decreased epidural phlegmon in the ventral epidural space, now measuring up to 3 mm in maximal AP diameter, previously 5 mm, now with minimal spinal canal stenosis at this level. Otherwise unchanged dorsal epidural enhancement and paraspinous phlegmon. Electronically Signed   By: Alison  Vasan M.D.   On:Wiliam Ke3 00:54   [place] EKG SITE RITE  Result Date: 10/22/2021 If Site Rite image not attached, placement could not be confirmed due to current cardiac rhythm.    Microbiology: Results for orders placed or performed during the hospital encounter of 10/10/21  Blood culture (routine x 2)     Status: Abnormal   Collection Time: 10/10/21  3:19 PM   Specimen: BLOOD LEFT FOREARM  Result Value Ref Range Status   Specimen Description   Final    BLOOD LEFT FOREARM Performed at [facility], [address] [place], [place] [address]    Special Requests   Final    BOTTLES DRAWN AEROBIC AND ANAEROBIC Blood Culture adequate volume Performed at [facility], [address] [place], [place] [address]    Culture  Setup Time   Final    Organism ID to follow GRAM POSITIVE COCCI IN BOTH AEROBIC AND ANAEROBIC BOTTLES CRITICAL RESULT CALLED TO, READ BACK BY AND VERIFIED WITH: JASON ROBBINS AT 0981 10/11/21.PMF Performed at [facility], [address] [place], [place] [address]    Culture METHICILLIN RESISTANT STAPHYLOCOCCUS AUREUS (A)  Final   Report Status 10/13/2021 FINAL  Final   Organism ID, Bacteria METHICILLIN RESISTANT STAPHYLOCOCCUS AUREUS  Final      Susceptibility   Methicillin resistant staphylococcus aureus - MIC*    CIPROFLOXACIN <=0.5 SENSITIVE Sensitive     ERYTHROMYCIN >=8 RESISTANT Resistant     GENTAMICIN <=0.5 SENSITIVE Sensitive     OXACILLIN >=4 RESISTANT Resistant     TETRACYCLINE <=1 SENSITIVE Sensitive     VANCOMYCIN <=0.5 SENSITIVE Sensitive     TRIMETH/SULFA <=10 SENSITIVE Sensitive     CLINDAMYCIN <=0.25 SENSITIVE Sensitive     RIFAMPIN <=0.5 SENSITIVE Sensitive     Inducible Clindamycin NEGATIVE Sensitive     * METHICILLIN RESISTANT STAPHYLOCOCCUS AUREUS  Blood Culture ID Panel (Reflexed)     Status: Abnormal   Collection Time: 10/10/21  3:19 PM  Result Value Ref Range Status   Enterococcus faecalis NOT DETECTED NOT DETECTED Final   Enterococcus Faecium NOT DETECTED NOT DETECTED Final   Listeria monocytogenes NOT DETECTED NOT DETECTED Final   Staphylococcus species DETECTED (A) NOT DETECTED  Final    Comment: CRITICAL RESULT CALLED TO, READ BACK BY AND  VERIFIED WITH: JASON ROBBINS AT 6045 10/11/21.PMF    Staphylococcus aureus (BCID) DETECTED (A) NOT DETECTED Final    Comment: Methicillin (oxacillin)-resistant Staphylococcus aureus (MRSA). MRSA is predictably resistant to beta-lactam antibiotics (except ceftaroline). Preferred therapy is vancomycin unless clinically contraindicated. Patient requires contact precautions if  hospitalized. CRITICAL RESULT CALLED TO, READ BACK BY AND VERIFIED WITH: JASON ROBBINS AT 4098 10/11/21.PMF    Staphylococcus epidermidis NOT DETECTED NOT DETECTED Final   Staphylococcus lugdunensis NOT DETECTED NOT DETECTED Final   Streptococcus species NOT DETECTED NOT DETECTED Final   Streptococcus agalactiae NOT DETECTED NOT DETECTED Final   Streptococcus pneumoniae NOT DETECTED NOT DETECTED Final   Streptococcus pyogenes NOT DETECTED NOT DETECTED Final   A.calcoaceticus-baumannii NOT DETECTED NOT DETECTED Final   Bacteroides fragilis NOT DETECTED NOT DETECTED Final   Enterobacterales NOT DETECTED NOT DETECTED Final   Enterobacter cloacae complex NOT DETECTED NOT DETECTED Final   Escherichia coli NOT DETECTED NOT DETECTED Final   Klebsiella aerogenes NOT DETECTED NOT DETECTED Final   Klebsiella oxytoca NOT DETECTED NOT DETECTED Final   Klebsiella pneumoniae NOT DETECTED NOT DETECTED Final   Proteus species NOT DETECTED NOT DETECTED Final   Salmonella species NOT DETECTED NOT DETECTED Final   Serratia marcescens NOT DETECTED NOT DETECTED Final   Haemophilus influenzae NOT DETECTED NOT DETECTED Final   Neisseria meningitidis NOT DETECTED NOT DETECTED Final   Pseudomonas aeruginosa NOT DETECTED NOT DETECTED Final   Stenotrophomonas maltophilia NOT DETECTED NOT DETECTED Final   Candida albicans NOT DETECTED NOT DETECTED Final   Candida auris NOT DETECTED NOT DETECTED Final   Candida glabrata NOT DETECTED NOT DETECTED Final   Candida krusei NOT DETECTED  NOT DETECTED Final   Candida parapsilosis NOT DETECTED NOT DETECTED Final   Candida tropicalis NOT DETECTED NOT DETECTED Final   Cryptococcus neoformans/gattii NOT DETECTED NOT DETECTED Final   Meth resistant mecA/C and MREJ DETECTED (A) NOT DETECTED Final    Comment: CRITICAL RESULT CALLED TO, READ BACK BY AND VERIFIED WITH: JASON ROBBINS AT 0608 10/11/21.PMF Performed at [facility], [address] [place], [place] [address]   Blood culture (routine x 2)     Status: Abnormal   Collection Time: 10/10/21  3:24 PM   Specimen: Right Antecubital; Blood  Result Value Ref Range Status   Specimen Description   Final    RIGHT ANTECUBITAL Performed at [facility], [address] [place], [place] [address]    Special Requests   Final    BOTTLES DRAWN AEROBIC AND ANAEROBIC Blood Culture adequate volume Performed at [facility], [address] [place], [place] [address]    Culture  Setup Time   Final    GRAM POSITIVE COCCI IN BOTH AEROBIC AND ANAEROBIC BOTTLES CRITICAL RESULT CALLED TO, READ BACK BY AND VERIFIED WITH: JASON ROBBINS AT 8657 10/11/21.PMF Performed at [facility], [address] [place], [place] [address]    Culture (A)  Final    STAPHYLOCOCCUS AUREUS SUSCEPTIBILITIES PERFORMED ON PREVIOUS CULTURE WITHIN THE LAST 5 DAYS. Performed at [facility], [address] [place], [place] [address]    Report Status 10/13/2021 FINAL  Final  Culture, blood (Routine X 2) w Reflex to ID Panel     Status: Abnormal   Collection Time: 10/13/21  4:31 AM   Specimen: BLOOD  Result Value Ref Range Status   Specimen Description   Final    BLOOD LEFT ANTECUBITAL Performed at [facility], [address]  [place], [place] [address]    Special Requests   Final    BOTTLES DRAWN AEROBIC AND ANAEROBIC Blood Culture adequate volume Performed at [facility], [address] [place], [place] [address]    Culture  Setup Time    Final    GRAM POSITIVE COCCI IN BOTH AEROBIC AND ANAEROBIC BOTTLES CRITICAL RESULT CALLED TO, READ BACK BY AND VERIFIED WITH: Lyndon CodeJASON ROBINS@ 2336 10/13/21 LFD Performed at [facility], [address] [place], [place] [address]    Culture (A)  Final    STAPHYLOCOCCUS AUREUS SUSCEPTIBILITIES PERFORMED ON PREVIOUS CULTURE WITHIN THE LAST 5 DAYS. Performed at [facility], [address] [place], [place] [address]    Report Status 10/16/2021 FINAL  Final  Culture, blood (Routine X 2) w Reflex to ID Panel     Status: Abnormal   Collection Time: 10/13/21  4:32 AM   Specimen: BLOOD  Result Value Ref Range Status   Specimen Description   Final    BLOOD RIGHT ANTECUBITAL Performed at [facility], [address] [place], [place] [address]    Special Requests   Final    BOTTLES DRAWN AEROBIC AND ANAEROBIC Blood Culture adequate volume Performed at [facility], [address] [place], [place] [address]    Culture  Setup Time   Final    GRAM POSITIVE COCCI ANAEROBIC BOTTLE ONLY CRITICAL RESULT CALLED TO, READ BACK BY AND VERIFIED WITH: JASON ROBINS @ 2236 10/13/21 LFD Performed at [facility], [address] [place], [place] [address]    Culture METHICILLIN RESISTANT STAPHYLOCOCCUS AUREUS (A)  Final   Report Status 10/16/2021 FINAL  Final   Organism ID, Bacteria METHICILLIN RESISTANT STAPHYLOCOCCUS AUREUS  Final      Susceptibility   Methicillin resistant staphylococcus aureus - MIC*    CIPROFLOXACIN <=0.5 SENSITIVE Sensitive     ERYTHROMYCIN >=8 RESISTANT Resistant     GENTAMICIN <=0.5 SENSITIVE Sensitive     OXACILLIN >=4 RESISTANT Resistant     TETRACYCLINE <=1 SENSITIVE Sensitive     VANCOMYCIN 1 SENSITIVE Sensitive     TRIMETH/SULFA <=10 SENSITIVE Sensitive     CLINDAMYCIN <=0.25 SENSITIVE Sensitive     RIFAMPIN <=0.5 SENSITIVE Sensitive     Inducible Clindamycin NEGATIVE Sensitive     * METHICILLIN RESISTANT STAPHYLOCOCCUS  AUREUS  Blood Culture ID Panel (Reflexed)     Status: Abnormal   Collection Time: 10/13/21  4:32 AM  Result Value Ref Range Status   Enterococcus faecalis NOT DETECTED NOT DETECTED Final   Enterococcus Faecium NOT DETECTED NOT DETECTED Final   Listeria monocytogenes NOT DETECTED NOT DETECTED Final   Staphylococcus species DETECTED (A) NOT DETECTED Final    Comment: CRITICAL RESULT CALLED TO, READ BACK BY AND VERIFIED WITH: JASON ROBINS@ 2236 10/13/21 LFD    Staphylococcus aureus (BCID) DETECTED (A) NOT DETECTED Final    Comment: Methicillin (oxacillin)-resistant Staphylococcus aureus (MRSA). MRSA is predictably resistant to beta-lactam antibiotics (except ceftaroline). Preferred therapy is vancomycin unless clinically contraindicated. Patient requires contact precautions if  hospitalized. CRITICAL RESULT CALLED TO, READ BACK BY AND VERIFIED WITH: JASON ROBINS@ 2236 10/13/21 LFD    Staphylococcus epidermidis NOT DETECTED NOT DETECTED Final   Staphylococcus lugdunensis NOT DETECTED NOT DETECTED Final   Streptococcus species NOT DETECTED NOT DETECTED Final   Streptococcus agalactiae NOT DETECTED NOT DETECTED Final   Streptococcus pneumoniae NOT DETECTED NOT DETECTED Final   Streptococcus pyogenes NOT DETECTED NOT DETECTED Final   A.calcoaceticus-baumannii NOT  DETECTED NOT DETECTED Final   Bacteroides fragilis NOT DETECTED NOT DETECTED Final   Enterobacterales NOT DETECTED NOT DETECTED Final   Enterobacter cloacae complex NOT DETECTED NOT DETECTED Final   Escherichia coli NOT DETECTED NOT DETECTED Final   Klebsiella aerogenes NOT DETECTED NOT DETECTED Final   Klebsiella oxytoca NOT DETECTED NOT DETECTED Final   Klebsiella pneumoniae NOT DETECTED NOT DETECTED Final   Proteus species NOT DETECTED NOT DETECTED Final   Salmonella species NOT DETECTED NOT DETECTED Final   Serratia marcescens NOT DETECTED NOT DETECTED Final   Haemophilus influenzae NOT DETECTED NOT DETECTED Final   Neisseria  meningitidis NOT DETECTED NOT DETECTED Final   Pseudomonas aeruginosa NOT DETECTED NOT DETECTED Final   Stenotrophomonas maltophilia NOT DETECTED NOT DETECTED Final   Candida albicans NOT DETECTED NOT DETECTED Final   Candida auris NOT DETECTED NOT DETECTED Final   Candida glabrata NOT DETECTED NOT DETECTED Final   Candida krusei NOT DETECTED NOT DETECTED Final   Candida parapsilosis NOT DETECTED NOT DETECTED Final   Candida tropicalis NOT DETECTED NOT DETECTED Final   Cryptococcus neoformans/gattii NOT DETECTED NOT DETECTED Final   Meth resistant mecA/C and MREJ DETECTED (A) NOT DETECTED Final    Comment: CRITICAL RESULT CALLED TO, READ BACK BY AND VERIFIED WITH: JASON ROBINS @ 2236 10/13/21 LFD Performed at [facility], [address] [place], [place] [address]   Culture, blood (Routine X 2) w Reflex to ID Panel     Status: None   Collection Time: 10/17/21  4:06 AM   Specimen: BLOOD  Result Value Ref Range Status   Specimen Description BLOOD LEFT AC  Final   Special Requests   Final    BOTTLES DRAWN AEROBIC AND ANAEROBIC Blood Culture adequate volume   Culture   Final    NO GROWTH 5 DAYS Performed at [facility], [address] [place], [place] [address]    Report Status 10/22/2021 FINAL  Final  Culture, blood (Routine X 2) w Reflex to ID Panel     Status: None   Collection Time: 10/17/21  4:06 AM   Specimen: BLOOD  Result Value Ref Range Status   Specimen Description BLOOD LEFT HAND  Final   Special Requests   Final    BOTTLES DRAWN AEROBIC AND ANAEROBIC Blood Culture adequate volume   Culture   Final    NO GROWTH 5 DAYS Performed at [facility], [address] [place], [place] [address]    Report Status 10/22/2021 FINAL  Final    Labs: CBC: Recent Labs  Lab 11/13/21 0437  WBC 8.4  NEUTROABS 4.3  HGB 11.2*  HCT 33.6*  MCV 87.0  PLT 325   Basic Metabolic Panel: No results for input(s): "NA", "K", "CL", "CO2", "GLUCOSE",  "BUN", "CREATININE", "CALCIUM", "MG", "PHOS" in the last 168 hours. Liver Function Tests: No results for input(s): "AST", "ALT", "ALKPHOS", "BILITOT", "PROT", "ALBUMIN" in the last 168 hours. CBG: Recent Labs  Lab 11/15/21 1153  GLUCAP 100*    Discharge time spent: greater than 30 minutes.  Signed: Enedina Finner, MD [facility] 11/18/2021

## 2021-11-18 NOTE — TOC Progression Note (Addendum)
Transition of Care [facility]) - Progression Note    Patient Details  Name: Cameron Hammond MRN: [id number] Date of Birth: [birth year]  [facility] CM/SW Contact  Margarito Liner, LCSW Phone Number: 11/18/2021, 1:52 PM  Clinical Narrative:   Patient will discharge home today. He does not have insurance or a PCP. Gave packet for free/low-cost healthcare in [facility] and intake paperwork for [employer]. [facility] will call CSW once medications are ready and will have volunteer bring them to the room. Patient will drive himself home.  Expected Discharge Plan: Home/Self Care Barriers to Discharge: Continued Medical Work up  Expected Discharge Plan and Services Expected Discharge Plan: Home/Self Care                                               Social Determinants of Health (SDOH) Interventions    Readmission Risk Interventions     No data to display

## 2021-11-18 NOTE — Plan of Care (Signed)

## 2021-11-18 NOTE — Discharge Instructions (Signed)
Abstain from using any street drugs [facility]

## 2021-11-18 NOTE — TOC Transition Note (Signed)
Transition of Care [facility]) - CM/SW Discharge Note   Patient Details  Name: Cameron Hammond MRN: [id number] Date of Birth: 1976-02-17  Transition of Care [facility]) CM/SW Contact:  Margarito Liner, LCSW Phone Number: 11/18/2021, 3:22 PM   Clinical Narrative: Patient has orders to discharge home today. Pharmacist will go [facility]. No further concerns. CSW signing off.    Final next level of care: Home/Self Care Barriers to Discharge: Barriers Resolved   Patient Goals and CMS Choice        Discharge Placement                Patient to be transferred to facility by: Self   Patient and family notified of of transfer: 11/18/21  Discharge Plan and Services                                     Social Determinants of Health (SDOH) Interventions     Readmission Risk Interventions     No data to display

## 2021-11-18 NOTE — Progress Notes (Signed)
ID Pt wants to go home Says he is feeling much better Pain left arm not as bad as before  O/e awake and alert Ambulating in the corridor  Patient Vitals for the past 24 hrs:  BP Temp Pulse Resp SpO2  11/18/21 0746 123/84 97.9 F (36.6 C) 93 20 95 %  11/18/21 0430 114/67 97.7 F (36.5 C) 76 18 99 %  11/17/21 2059 110/83 98.4 F (36.9 C) 87 18 100 %   Chest b/l air entry Hss1s2 Abd soft CNS non focal   Labs    Latest Ref Rng & Units 11/13/2021    4:37 AM 11/10/2021    9:20 AM 11/04/2021    6:58 AM  CBC  WBC 4.0 - 10.5 K/uL 8.4  7.5  6.2   Hemoglobin 13.0 - 17.0 g/dL 11.2  11.0  11.9   Hematocrit 39.0 - 52.0 % 33.6  32.9  36.3   Platelets 150 - 400 K/uL 325  257  252        Latest Ref Rng & Units 11/10/2021    9:20 AM 11/07/2021    5:42 AM 11/06/2021    6:30 AM  CMP  Glucose 70 - 99 mg/dL 114     BUN 6 - 20 mg/dL 15     Creatinine 0.61 - 1.24 mg/dL 0.85   0.83   Sodium 135 - 145 mmol/L 136     Potassium 3.5 - 5.1 mmol/L 3.9  4.2    Chloride 98 - 111 mmol/L 102     CO2 22 - 32 mmol/L 27     Calcium 8.9 - 10.3 mg/dL 9.4       Micro 10/10/21 BC MRSA 10/13/21 BC MRSA 10/17/21 BC NG CRP 20>3>1>4.2>1.7 ESR 128>71>52>56>49 CK 45>80>102    Impression/recommendation MRSA bacteremia with T2-T3 osteo/discitis and associated epidural phlegmon, paraspinal phlegmon  C2-C3 facet joint septic arthritis on the rt side Was initially on 2 anti mrsa treatment and now on Dapto Getting 6 weeks of IV in the hospital due to substance use and not able to send him home with PICC, End date 11/28/21.  As ESR and CRP is doing much better from admission and patient wants to go home will send him on PO linezolid 673m BID for 15 days Discussed side effects and Drug interaction with linezolid and foods/drink to avoid  Pain left arm and left side of the chest could be nerve pain. Cough-cxr no infiltrate, normal eosinophil count  Discussed the management with the patient, hospitlaist and  pharmacist and his nurse ID will see him as OP in 2 weeks

## 2021-11-25 ENCOUNTER — Other Ambulatory Visit: Payer: Self-pay

## 2021-11-26 ENCOUNTER — Other Ambulatory Visit: Payer: Self-pay

## 2021-11-28 ENCOUNTER — Other Ambulatory Visit: Payer: Self-pay

## 2021-11-29 ENCOUNTER — Other Ambulatory Visit: Payer: Self-pay

## 2021-11-29 ENCOUNTER — Other Ambulatory Visit: Payer: Self-pay | Admitting: Pharmacy Technician

## 2021-11-29 NOTE — Patient Outreach (Signed)
Patient only signed DOH Attestation.  Would need to provide current year's household income if PAP medications were needed.  Maddi Collar J. Addysyn Fern Patient Advocate Specialist [facility]  

## 2021-12-03 ENCOUNTER — Ambulatory Visit
Admission: RE | Admit: 2021-12-03 | Discharge: 2021-12-03 | Disposition: A | Discharge status: Home / Self Care | Payer: Self-pay | Attending: Infectious Diseases | Admitting: Infectious Diseases

## 2021-12-03 ENCOUNTER — Ambulatory Visit: Payer: Self-pay | Attending: Infectious Diseases | Admitting: Infectious Diseases

## 2021-12-03 ENCOUNTER — Ambulatory Visit
Admission: RE | Admit: 2021-12-03 | Discharge: 2021-12-03 | Disposition: A | Discharge status: Home / Self Care | Payer: Self-pay | Source: Ambulatory Visit | Attending: Neurosurgery | Admitting: Neurosurgery

## 2021-12-03 ENCOUNTER — Other Ambulatory Visit: Payer: Self-pay

## 2021-12-03 ENCOUNTER — Other Ambulatory Visit
Admission: RE | Admit: 2021-12-03 | Discharge: 2021-12-03 | Disposition: A | Discharge status: Home / Self Care | Payer: Self-pay | Source: Home / Self Care | Attending: Infectious Diseases | Admitting: Infectious Diseases

## 2021-12-03 ENCOUNTER — Encounter: Payer: Self-pay | Admitting: Infectious Diseases

## 2021-12-03 VITALS — BP 147/84 | HR 96 | Temp 98.4°F | Ht 67.0 in | Wt 170.0 lb

## 2021-12-03 DIAGNOSIS — M4643 Discitis, unspecified, cervicothoracic region: Secondary | ICD-10-CM | POA: Insufficient documentation

## 2021-12-03 DIAGNOSIS — M4652 Other infective spondylopathies, cervical region: Secondary | ICD-10-CM

## 2021-12-03 DIAGNOSIS — R7881 Bacteremia: Secondary | ICD-10-CM | POA: Insufficient documentation

## 2021-12-03 DIAGNOSIS — B9562 Methicillin resistant Staphylococcus aureus infection as the cause of diseases classified elsewhere: Secondary | ICD-10-CM | POA: Insufficient documentation

## 2021-12-03 DIAGNOSIS — A4902 Methicillin resistant Staphylococcus aureus infection, unspecified site: Secondary | ICD-10-CM

## 2021-12-03 DIAGNOSIS — M009 Pyogenic arthritis, unspecified: Secondary | ICD-10-CM | POA: Insufficient documentation

## 2021-12-03 DIAGNOSIS — M4624 Osteomyelitis of vertebra, thoracic region: Secondary | ICD-10-CM | POA: Insufficient documentation

## 2021-12-03 LAB — COMPREHENSIVE METABOLIC PANEL
ALT: 23 U/L (ref 0–44)
AST: 23 U/L (ref 15–41)
Albumin: 4.9 g/dL (ref 3.5–5.0)
Alkaline Phosphatase: 74 U/L (ref 38–126)
Anion gap: 5 (ref 5–15)
BUN: 11 mg/dL (ref 6–20)
CO2: 27 mmol/L (ref 22–32)
Calcium: 9.2 mg/dL (ref 8.9–10.3)
Chloride: 107 mmol/L (ref 98–111)
Creatinine, Ser: 0.86 mg/dL (ref 0.61–1.24)
GFR, Estimated: >60 mL/min (ref >60)
Glucose, Bld: 90 mg/dL (ref 70–99)
Potassium: 3.8 mmol/L (ref 3.5–5.1)
Sodium: 139 mmol/L (ref 135–145)
Total Bilirubin: 0.5 mg/dL (ref 0.3–1.2)
Total Protein: 8.4 g/dL — ABNORMAL HIGH (ref 6.5–8.1)

## 2021-12-03 LAB — CBC WITH DIFFERENTIAL/PLATELET
Abs Immature Granulocytes: 0.01 10*3/uL (ref 0.00–0.07)
Basophils Absolute: 0 10*3/uL (ref 0.0–0.1)
Basophils Relative: 1 %
Eosinophils Absolute: 0.1 10*3/uL (ref 0.0–0.5)
Eosinophils Relative: 1 %
HCT: 35.4 % — ABNORMAL LOW (ref 39.0–52.0)
Hemoglobin: 11.7 g/dL — ABNORMAL LOW (ref 13.0–17.0)
Immature Granulocytes: 0 %
Lymphocytes Relative: 33 %
Lymphs Abs: 1.9 10*3/uL (ref 0.7–4.0)
MCH: 29.1 pg (ref 26.0–34.0)
MCHC: 33.1 g/dL (ref 30.0–36.0)
MCV: 88.1 fL (ref 80.0–100.0)
Monocytes Absolute: 0.3 10*3/uL (ref 0.1–1.0)
Monocytes Relative: 6 %
Neutro Abs: 3.4 10*3/uL (ref 1.7–7.7)
Neutrophils Relative %: 59 %
Platelets: 299 10*3/uL (ref 150–400)
RBC: 4.02 MIL/uL — ABNORMAL LOW (ref 4.22–5.81)
RDW: 14 % (ref 11.5–15.5)
WBC: 5.7 10*3/uL (ref 4.0–10.5)
nRBC: 0 % (ref 0.0–0.2)

## 2021-12-03 LAB — C-REACTIVE PROTEIN: CRP: 0.6 mg/dL (ref <1.0)

## 2021-12-03 LAB — SEDIMENTATION RATE: Sed Rate: 34 mm/hr — ABNORMAL HIGH (ref 0–15)

## 2021-12-03 MED ORDER — LINEZOLID 600 MG PO TABS
600.0000 mg | ORAL_TABLET | Freq: Two times a day (BID) | ORAL | 0 refills | Status: AC
  Filled 2021-12-03: qty 30, 15d supply, fill #0

## 2021-12-03 NOTE — Patient Instructions (Addendum)
You are here for follow up for the infection in the spine- You are now 6 weeks into antibiotics- today will do some labs- and depending on the labs will decide on further management. Because of sever pain which is debilitating I am referring you to neurosurgeon Dr.Yarbrough  [address]  [address]- pharmacy to pick up meds

## 2021-12-03 NOTE — Progress Notes (Signed)
NAME: Cameron Hammond  DOB: 17-May-1976  MRN: [id number]  Date/Time: 12/03/2021 11:57 AM  Subjective:   Cameron Hammond is a 46 y.o. male is here for follow up for MRSA spine infection He was recently in [facility] for 4 weeks and was discharged on July 3rd for MRSA bacteremia, MRSA thoracic vertebrae T2-T3 osteo/discitis and associated epidural phlegmon and paraspinal phlegmon. 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  Social History   Socioeconomic History   Marital status: Divorced    Spouse name: Not on file   Number of children: Not on file   Years of education: Not on file   Highest education level: Not on file  Occupational History   Not on file  Tobacco Use   Smoking status: Every Day   Smokeless tobacco: Never  Substance and Sexual Activity   Alcohol use: Not Currently   Drug use: No   Sexual activity: Not on file  Other Topics Concern   Not on file  Social History Narrative   Not on file   Social Determinants of Health   Financial Resource Strain: Not on file  Food Insecurity: Not on file  Transportation Needs: Not on file  Physical Activity: Not on file  Stress: Not on file  Social Connections: Not on file  Intimate Partner Violence: Not on file    No family history on file. No Known Allergies I? Current Outpatient Medications   Medication Sig Dispense Refill   cyclobenzaprine (FLEXERIL) 10 MG tablet Take 1 tablet (10 mg total) by mouth 3 (three) times daily as needed for muscle spasms. 30 tablet 0   gabapentin (NEURONTIN) 100 MG capsule Take 2 capsules (200 mg total) by mouth 3 (three) times daily. 60 capsule 0   linezolid (ZYVOX) 600 MG tablet Take 1 tablet (600 mg total) by mouth every 12 (twelve) hours for 15 days. 30 tablet 0   meloxicam (MOBIC) 15 MG tablet Take 1 tablet (15 mg total) by mouth daily. 30 tablet 0   oxyCODONE (OXYCONTIN) 15 mg 12 hr tablet Take 1 tablet (15 mg total) by mouth every 12 (twelve) hours. 14 tablet 0   oxyCODONE-acetaminophen (PERCOCET/ROXICET) 5-325 MG tablet Take 1 tablet by mouth every 8 (eight) hours as needed for severe pain or moderate pain (breakthrough pain). 20 tablet 0   No current facility-administered medications for this visit.     Abtx:  Anti-infectives (From admission, onward)    None       REVIEW OF SYSTEMS:  Const: negative fever, negative chills, negative weight loss Eyes: negative diplopia or visual changes, negative eye pain ENT: negative coryza, negative sore throat Resp: negative cough, hemoptysis, dyspnea Cards: negative for chest pain, palpitations, lower extremity edema GU: negative  for frequency, dysuria and hematuria GI: Negative for abdominal pain, diarrhea, bleeding, constipation Skin: negative for rash and pruritus Heme: negative for easy bruising and gum/nose bleeding MS: negative for myalgias, arthralgias, back pain and muscle weakness Neurolo:negative for headaches, dizziness, vertigo, memory problems  Psych: negative for feelings of anxiety, depression  Endocrine: negative for thyroid, diabetes Allergy/Immunology- negative for any medication or food allergies ? Pertinent Positives include : Objective:  VITALS:  BP (!) 147/84   Pulse 96   Temp 98.4 F (36.9 C) (Temporal)   Ht 5' 7" (1.702 m)   Wt 170 lb (77.1 kg)   BMI 26.63 kg/m   LDA Foley Central line Other drainage tubes PHYSICAL EXAM:  General: Alert, cooperative, no distress, appears stated age.  Head: Normocephalic, without obvious abnormality, atraumatic. Eyes: Conjunctivae clear, anicteric sclerae. Pupils are equal ENT Nares normal. No drainage or sinus tenderness. Lips, mucosa, and tongue normal. No Thrush Neck: Supple, symmetrical, no adenopathy, thyroid: non tender no carotid bruit and no JVD. Back: No CVA tenderness. Lungs: Clear to auscultation bilaterally. No Wheezing or Rhonchi. No rales. Heart: Regular rate and rhythm, no murmur, rub or gallop. Abdomen: Soft, non-tender,not distended. Bowel sounds normal. No masses Extremities: atraumatic, no cyanosis. No edema. No clubbing Skin: No rashes or lesions. Or bruising Lymph: Cervical, supraclavicular normal. Neurologic: Grossly non-focal  Labs ESR  128>71>52>56>52>49 CRP 20>3>1>3,8>4.2>1.7  IMAGING RESULTS report  [facility] July 16: MRI spine MRI findings consistent with septic arthritis of the right C2-C3 facet joint.  2. Findings consistent with osteomyelitis of T2, T3, and T4. There is osseous  destruction of the T3 vertebral body. There is associated phlegmon within the anterior tibial space at these levels.    BC- 5/25 MRSA 5/28 MRSA 6/1 Neg ? Impression/Recommendation MRSA bacteremia  resolved Thoracic discitis/osteo of T2-T3 spine and cervical facet septic arthritis of C2-C3  He received 33 days of appropriate IV antibiotics after clearance of bacteremia in the hospital ( he could not be discharged with PICC because of substance use) As the ESR/CRP was improving very well he was sent home on 11/18/21 with 2 weeks of linezolid followed by further oral antibiotics HE is here for follow up Still has lot of pain Will refer to neurosurgeon Appt made for this Friday Today ESR was repeated and it was 36 CRP pending WBC N Will continue linezolid for 2 more weeks and after that will do  Po antibiotic - may be bactrim, Sent prescription to medication management Will check labs in 2-3 weeks  disability  form filled.  ? ___________________________________________________ Discussed with patient, , mother and neurosurgeon Follow up next month May bring him earlier depending on his symptoms Note:  This document was prepared using Dragon voice recognition software and may include unintentional dictation errors.  ? ?

## 2021-12-04 ENCOUNTER — Telehealth: Payer: Self-pay

## 2021-12-04 NOTE — Telephone Encounter (Signed)
Patient aware of results. Patient confirmed that he did pick up Linezolid from pharmacy. Patient will let us know if he is able to make appointment change on 8/3 at 10:45 AM.    Marcell Anger, CMA

## 2021-12-04 NOTE — Telephone Encounter (Signed)
-----  Message from Tsosie Billing, MD sent at 12/04/2021  9:38 AM EDT ----- Please call him and let him that the inflammatory markers CRP is normal ( was 20 6 weeks ago) and ESR is 32( was 120 6 weeks ago) so his spine infection is getting much better and he will have to continue taking linezolid. I gave him a follow up appt for Aug 15-  but I rescheduled it for Aug 3rd at 10.45 Am- please let him know of the change. Also please ask him whether he picked up linezolid ( antibiotic) from the [facility] yesterday> thx  ( he has an appt with neurosurgeon of Friday-he is aware) ----- Message ----- From: Buel Ream, Lab [facility] [place] Sent: 12/03/2021   1:37 PM EDT To: Tsosie Billing, MD

## 2021-12-04 NOTE — Telephone Encounter (Signed)
Patient's mother called wanting to speak with Dr. Rivka Safer. 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  States "I just want her to know what kind of condition he's in."  She says she had to make him leave her house last night and she is not sure where he is, but that she is worried for his safety.   Advised that if she sees [name] and she is worried for his safety to take him to the emergency department or call 911 for medical evaluation, she verbalized understanding.   Sandie Ano, RN

## 2021-12-06 ENCOUNTER — Telehealth: Payer: Self-pay | Admitting: Infectious Diseases

## 2021-12-06 ENCOUNTER — Ambulatory Visit: Payer: Self-pay | Admitting: Neurosurgery

## 2021-12-06 NOTE — Telephone Encounter (Signed)
Called patient to find out why he did not go for the neurosurgery appt at 11 am today. No response Called mother , but could not reach her- left a message

## 2021-12-19 ENCOUNTER — Ambulatory Visit: Payer: Self-pay | Admitting: Infectious Diseases

## 2021-12-31 ENCOUNTER — Ambulatory Visit: Payer: Self-pay | Admitting: Infectious Diseases

## 2022-08-04 IMAGING — MR MR CERVICAL SPINE WO/W CM
5 of 8 series · 27 of 48 positions shown · IV contrast (7.5ml Gadavist)
Comparison: 10/16/2021

CLINICAL DATA: Discitis-osteomyelitis

EXAM:
MRI CERVICAL AND THORACIC SPINE WITHOUT AND WITH CONTRAST
TECHNIQUE: Multiplanar and multiecho pulse sequences of the cervical spine, to
include the craniocervical junction and cervicothoracic junction,
and the thoracic spine, were obtained without and with intravenous
contrast.
CONTRAST:  7.5mL GADAVIST GADOBUTROL 1 MMOL/ML IV SOLN

[Series 5: T2 · sagittal · 3.0mm · 0.62mm/px · 4 of 13 slices shown (1 of 2)]
[im 1/13]
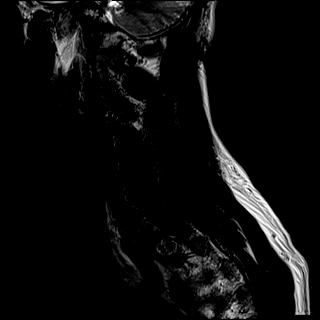
[im 5/13]
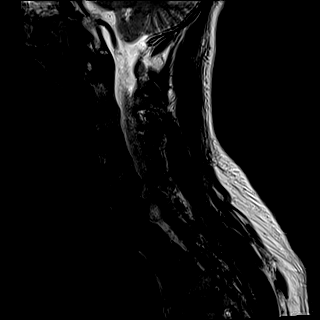
[im 9/13]
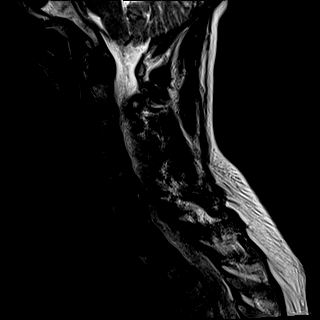
[im 13/13]
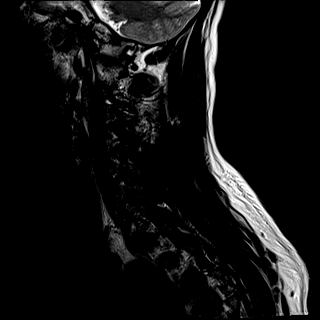

[Series 7: STIR · sagittal · 3.0mm · 0.62mm/px · 4 of 13 slices shown]
[im 1/13]
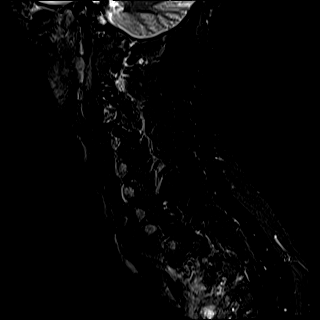
[im 5/13]
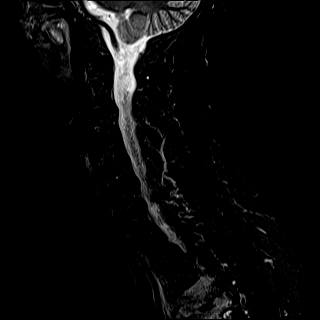
[im 9/13]
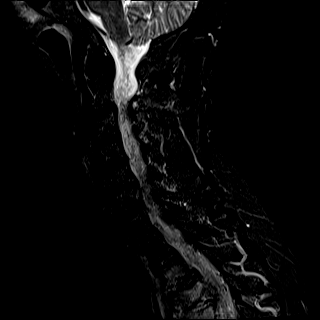
[im 13/13]
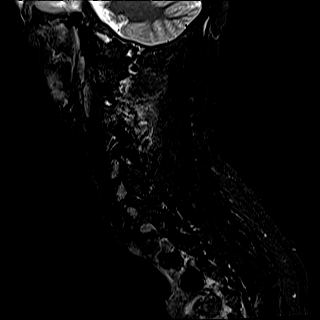

[Series 8: T2 · axial · 3.0mm · 0.70mm/px · z∈[-105,-2]mm · 8 of 29 slices shown (2 of 2)]
[im 1/29]
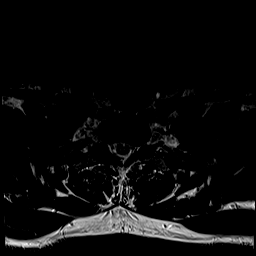
[im 5/29]
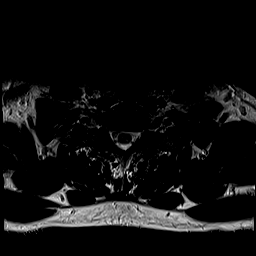
[im 9/29]
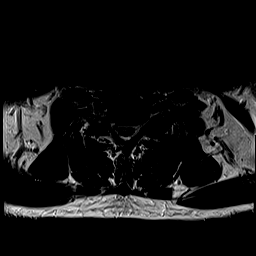
[im 13/29]
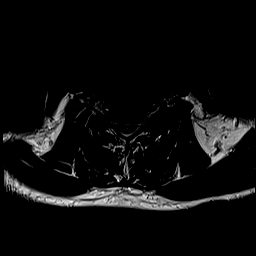
[im 17/29]
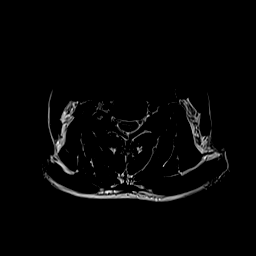
[im 21/29]
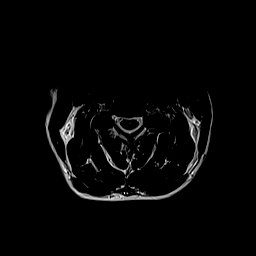
[im 25/29]
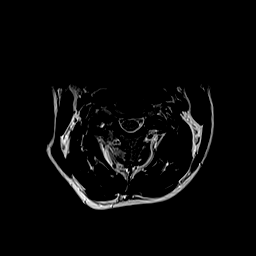
[im 29/29]
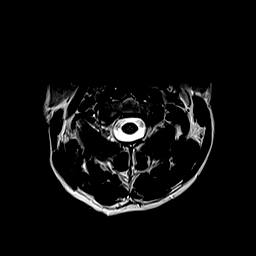

[Series 10: T1 · axial · non-contrast · 3.0mm · 0.35mm/px · z∈[-105,-2]mm · 8 of 29 slices shown]
[im 1/29]
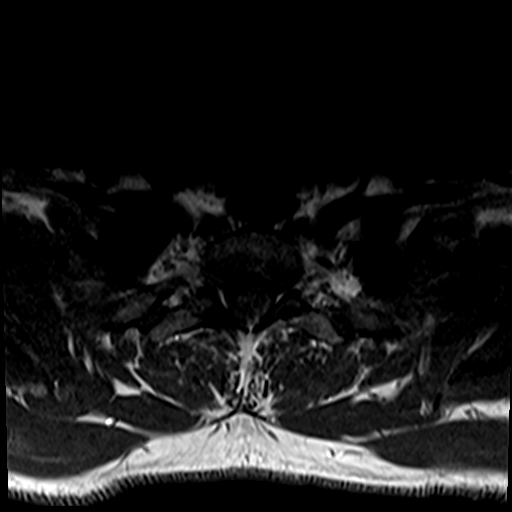
[im 5/29]
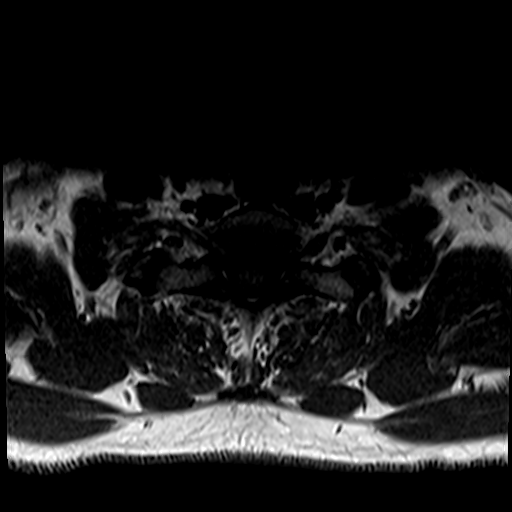
[im 9/29]
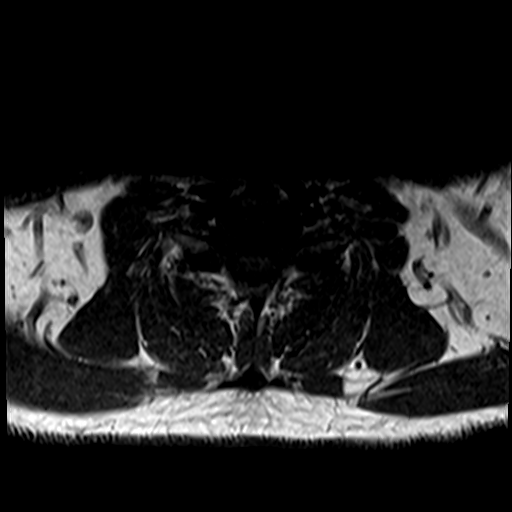
[im 13/29]
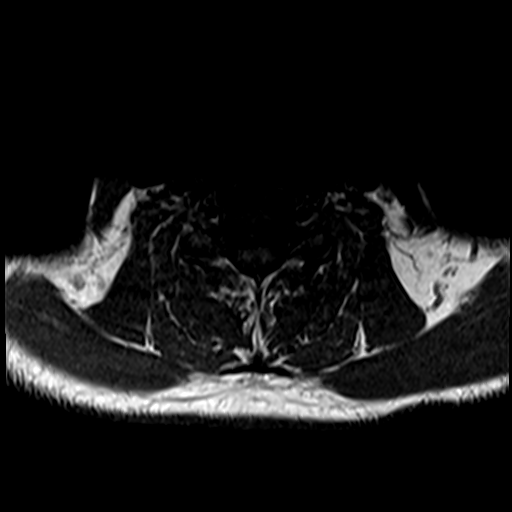
[im 17/29]
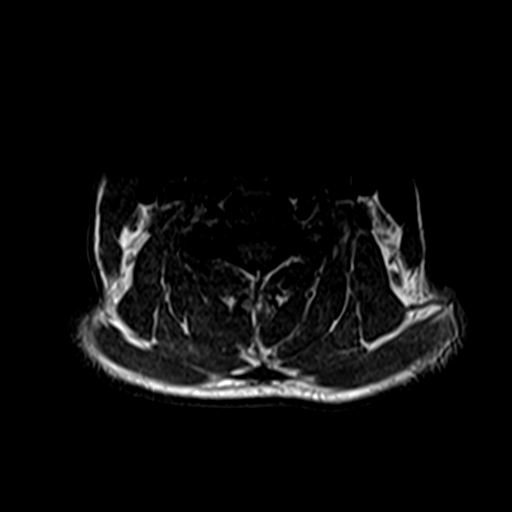
[im 21/29]
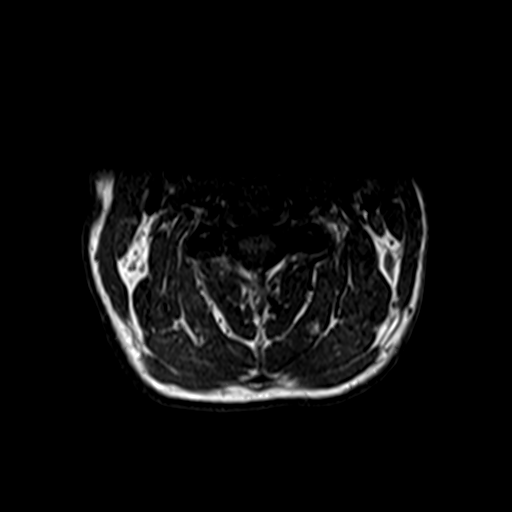
[im 25/29]
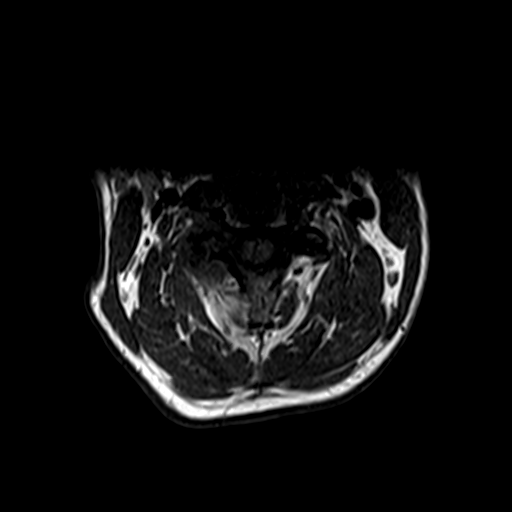
[im 29/29]
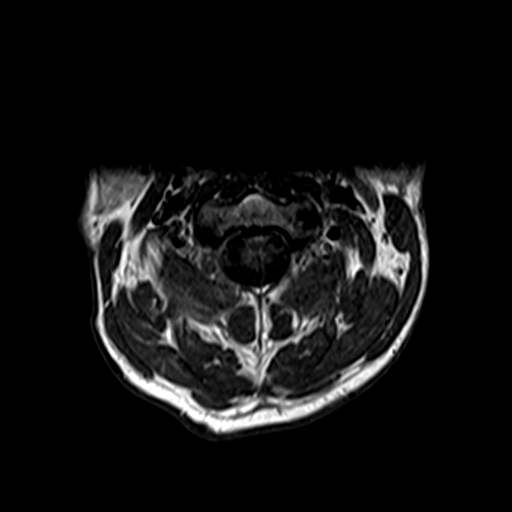

[Series 12: T1 post-contrast · axial · 3.0mm · 0.35mm/px · z∈[-105,-79]mm · 3 of 27 slices shown]
[im 1/27]
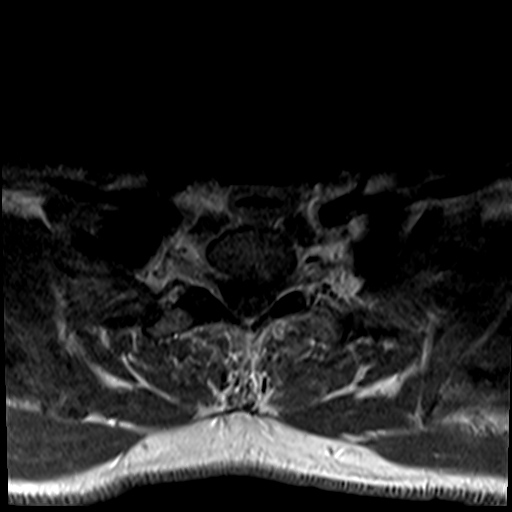
[im 4/27]
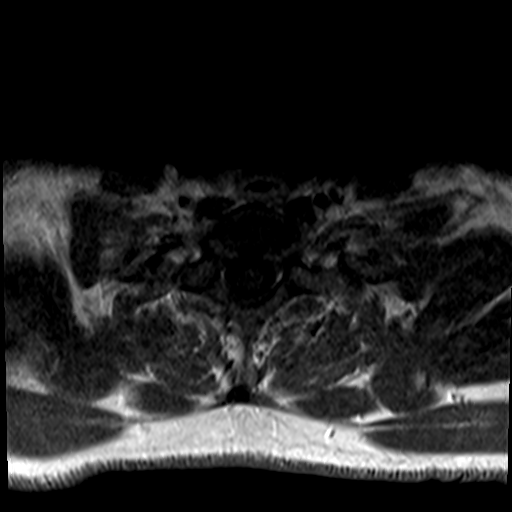
[im 8/27]
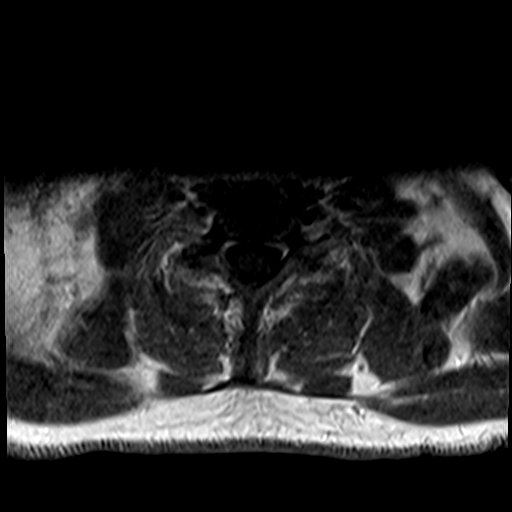

[27 of 48 positions shown; findings below may reference images not displayed]

FINDINGS: MRI CERVICAL SPINE FINDINGS

Alignment: Unchanged trace anterolisthesis of C2 on C3.

Vertebrae: Overall unchanged marrow edema and enhancement about the
right C2-C3 facets, consistent with ongoing septic arthritis. Again
noted is the associated joint effusion and adjacent soft tissue
inflammation and enhancement which also appears unchanged from the
prior exam redemonstrated small microabscesses in the right
paraspinous musculature posterior to the inflamed right C2-C3 facets
(series 12, image 7). Mild epidural enhancement without a discrete
collection.

No additional findings concerning for discitis osteomyelitis in the
cervical spine. No acute fracture or suspicious osseous lesion.

Cord: Normal signal and morphology. No abnormal spinal cord
enhancement. No epidural collection in the cervical spine.

Posterior Fossa, vertebral arteries, paraspinal tissues:
Microabscesses in the right paraspinous musculature posterior to the
C2-C3 facets, described above. No additional acute finding.

Disc levels:

C2-C3: Trace anterolisthesis with mild disc bulge.
Right-greater-than-left uncovertebral and facet hypertrophy. No
spinal canal stenosis. Mild left and moderate right neural foraminal
narrowing, unchanged.

C3-C4: No significant disc bulge. Uncovertebral and facet
arthropathy. No spinal canal stenosis. Moderate left and mild right
neural foraminal narrowing, unchanged.

C4-C5: Minimal disc bulge. Uncovertebral and right-greater-than-left
facet arthropathy. No spinal canal stenosis. Moderate right neural
foraminal narrowing, unchanged.

C5-C6: Disc bulge with right paracentral protrusion. Facet and
uncovertebral hypertrophy. Mild spinal canal stenosis and moderate
bilateral neural foraminal narrowing, unchanged.

C6-C7: Mild disc bulge. Facet and uncovertebral hypertrophy. Mild
spinal canal stenosis and moderate bilateral neural foraminal
narrowing, unchanged

C7-T1: No significant disc bulge. No spinal canal stenosis or
neuroforaminal narrowing.

MRI THORACIC SPINE FINDINGS

Alignment: Preservation of the normal thoracic kyphosis. No
significant listhesis.

Vertebrae: Redemonstrated findings consistent with discitis
osteomyelitis at T2-T4 with grossly unchanged distribution of marrow
edema and enhancement, but slightly increased intensity. Decreased
disc height at T3-T4. Preserved disc height at T2-T3. No new level
of involvement. Decreased amount of ventral epidural phlegmon
extending from the superior aspect of T2 through the inferior aspect
of T4, which measures up to 3 mm in greatest AP dimension (series
23, image 12), previously 5 mm. Overall unchanged dorsal epidural
enhancement is noted from C7-T1 through T5-T6 (series 24, images
9-10) with similar trace dorsal epidural phlegmon/abscess at the
level of T2-T3. Decreased spinal canal stenosis at T2-T3, now
minimal.

No evidence of other affected levels. No suspicious osseous lesions.

Cord: Redemonstrated trace syrinx at T4-T5. Otherwise normal in
signal and morphology. No abnormal enhancement.

Paraspinal and other soft tissues: Redemonstrated paraspinous
phlegmon and inflammation about T2, T3, and T4, with redemonstrated
microabscesses within it (series 20, image 13).

Disc levels:

Redemonstrated degenerative changes.  No new or progressive finding.
IMPRESSION: 1. Findings consistent with persistent acute septic arthritis
involving the right C2-C3 facet which appears overall unchanged from
the prior exam. Again noted is adjacent paraspinous soft tissue
inflammation with a few small microabscesses.
2. Redemonstrated findings consistent with discitis osteomyelitis at
T2-T4, with slightly decreased epidural phlegmon in the ventral
epidural space, now measuring up to 3 mm in maximal AP diameter,
previously 5 mm, now with minimal spinal canal stenosis at this
level. Otherwise unchanged dorsal epidural enhancement and
paraspinous phlegmon.
